# Patient Record
Sex: Female | Born: 1945 | Race: White | Hispanic: No | Marital: Married | Smoking: Never smoker
Health system: Southern US, Community
[De-identification: ages and names within clinical notes are randomized; demographics above are authoritative.]

## PROBLEM LIST (undated history)

## (undated) DIAGNOSIS — I219 Acute myocardial infarction, unspecified: Secondary | ICD-10-CM

## (undated) DIAGNOSIS — I259 Chronic ischemic heart disease, unspecified: Secondary | ICD-10-CM

## (undated) DIAGNOSIS — R5383 Other fatigue: Secondary | ICD-10-CM

## (undated) DIAGNOSIS — I10 Essential (primary) hypertension: Secondary | ICD-10-CM

## (undated) DIAGNOSIS — D649 Anemia, unspecified: Secondary | ICD-10-CM

## (undated) DIAGNOSIS — E785 Hyperlipidemia, unspecified: Secondary | ICD-10-CM

## (undated) DIAGNOSIS — N189 Chronic kidney disease, unspecified: Secondary | ICD-10-CM

## (undated) DIAGNOSIS — I5032 Chronic diastolic (congestive) heart failure: Secondary | ICD-10-CM

## (undated) DIAGNOSIS — M159 Polyosteoarthritis, unspecified: Secondary | ICD-10-CM

## (undated) DIAGNOSIS — I2510 Atherosclerotic heart disease of native coronary artery without angina pectoris: Secondary | ICD-10-CM

## (undated) DIAGNOSIS — E119 Type 2 diabetes mellitus without complications: Secondary | ICD-10-CM

## (undated) DIAGNOSIS — I110 Hypertensive heart disease with heart failure: Secondary | ICD-10-CM

## (undated) DIAGNOSIS — K529 Noninfective gastroenteritis and colitis, unspecified: Secondary | ICD-10-CM

## (undated) DIAGNOSIS — G2581 Restless legs syndrome: Secondary | ICD-10-CM

## (undated) DIAGNOSIS — E559 Vitamin D deficiency, unspecified: Secondary | ICD-10-CM

## (undated) DIAGNOSIS — R001 Bradycardia, unspecified: Secondary | ICD-10-CM

## (undated) DIAGNOSIS — D631 Anemia in chronic kidney disease: Secondary | ICD-10-CM

## (undated) HISTORY — DX: Essential (primary) hypertension: I10

## (undated) HISTORY — PX: CATARACT EXTRACTION, BILATERAL: SHX1313

## (undated) HISTORY — PX: VARICOSE VEIN SURGERY: SHX832

## (undated) HISTORY — DX: Chronic diastolic (congestive) heart failure: I50.32

## (undated) HISTORY — DX: Hypertensive heart disease with heart failure: I11.0

## (undated) HISTORY — DX: Bradycardia, unspecified: R00.1

## (undated) HISTORY — DX: Restless legs syndrome: G25.81

## (undated) HISTORY — DX: Acute myocardial infarction, unspecified: I21.9

## (undated) HISTORY — DX: Polyosteoarthritis, unspecified: M15.9

## (undated) HISTORY — PX: CARDIAC SURGERY: SHX584

## (undated) HISTORY — DX: Chronic kidney disease, unspecified: N18.9

## (undated) HISTORY — DX: Other fatigue: R53.83

## (undated) HISTORY — DX: Anemia in chronic kidney disease: D63.1

## (undated) HISTORY — DX: Vitamin D deficiency, unspecified: E55.9

## (undated) HISTORY — PX: COLONOSCOPY: SHX174

## (undated) HISTORY — DX: Chronic ischemic heart disease, unspecified: I25.9

## (undated) HISTORY — DX: Anemia, unspecified: D64.9

## (undated) HISTORY — DX: Hyperlipidemia, unspecified: E78.5

## (undated) HISTORY — DX: Type 2 diabetes mellitus without complications: E11.9

## (undated) MED FILL — Iron Sucrose Inj 20 MG/ML (Fe Equiv): INTRAVENOUS | Qty: 10 | Status: AC

---

## 2003-11-05 HISTORY — PX: ACHILLES TENDON SURGERY: SHX542

## 2009-11-04 HISTORY — PX: CORONARY ARTERY BYPASS GRAFT: SHX141

## 2009-11-27 ENCOUNTER — Ambulatory Visit: Payer: Self-pay | Admitting: Thoracic Surgery (Cardiothoracic Vascular Surgery)

## 2011-04-15 ENCOUNTER — Other Ambulatory Visit: Payer: Self-pay | Admitting: Internal Medicine

## 2015-05-15 ENCOUNTER — Encounter: Payer: Self-pay | Admitting: Podiatry

## 2015-05-15 ENCOUNTER — Ambulatory Visit (INDEPENDENT_AMBULATORY_CARE_PROVIDER_SITE_OTHER): Payer: Medicare PPO | Admitting: Podiatry

## 2015-05-15 ENCOUNTER — Ambulatory Visit (INDEPENDENT_AMBULATORY_CARE_PROVIDER_SITE_OTHER): Payer: Medicare PPO

## 2015-05-15 VITALS — BP 134/68 | HR 77 | Resp 18

## 2015-05-15 DIAGNOSIS — M722 Plantar fascial fibromatosis: Secondary | ICD-10-CM

## 2015-05-15 DIAGNOSIS — E1149 Type 2 diabetes mellitus with other diabetic neurological complication: Secondary | ICD-10-CM

## 2015-05-15 DIAGNOSIS — R52 Pain, unspecified: Secondary | ICD-10-CM

## 2015-05-15 DIAGNOSIS — M19172 Post-traumatic osteoarthritis, left ankle and foot: Secondary | ICD-10-CM | POA: Diagnosis not present

## 2015-05-15 MED ORDER — MELOXICAM 15 MG PO TABS: 15.0000 mg | ORAL_TABLET | Freq: Every day | ORAL | Status: DC

## 2015-05-15 NOTE — Progress Notes (Addendum)
   Subjective:    Patient ID: Carmen Ford, female    DOB: 1945/12/03, 69 y.o.   MRN: [id number]  HPI  MY FEET ARE SORE AND TENDER AND HAVE BEEN GOING ON FOR ABOUT 6 MONTHS OR LONGER AND I WALK ON THE OUTSIDE OF MY LEFT FOOT AND BURNS AND THROBS AND I AM A DIABETIC This diabetic patient presents to the office for myriad of complaints.  She says she experiences burning pain through right arch when she walks.  She has pain along the outer border left foot and wears down her shoes outide left foot.  She has history of Achilles rupture in 2007.  She was treated with cast.  She then was deen buy Dr. Nolon Nations who did surgery to repair her achilles.  She says her left foot now hurts all over.  Also she relates her big toe left foot is in contracted position.    Review of Systems  Eyes: Positive for visual disturbance.  Musculoskeletal:       DIFFICULTY WALKING  All other systems reviewed and are negative.      Objective:   Physical Exam Objective: Review of past medical history, medications, social history and allergies were performed.  Vascular: Dorsalis pedis and posterior tibial pulses were palpable B/L, capillary refill was  WNL B/L, temperature gradient was WNL B/L   Skin:  No signs of symptoms of infection or ulcers on both feet  Nails: appear healthy with no signs of mycosis or infections  Sensory: Semmes Weinstein monifilament diminished.  Orthopedic: Orthopedic evaluation demonstrates all joints distal t ankle have full ROM without crepitus,  Except she has  No sinus tarsi pain. no motion STJ left foot.  Good muscle strength left achilles tendon. Fixed plantarflexion position left foot.         Assessment & Plan:  Ankle and STJ arthritis left foot.   Plantar fascitis right foot.  Plantar fascitis right foot.  Treated with strapping.  Rearfoot arthritis causing lateral column pain during gait.  Tx with Mobic 15 mg.  # 30. RTC 2 weeks.

## 2015-06-05 ENCOUNTER — Encounter: Payer: Self-pay | Admitting: Podiatry

## 2015-06-05 ENCOUNTER — Ambulatory Visit (INDEPENDENT_AMBULATORY_CARE_PROVIDER_SITE_OTHER): Payer: Medicare PPO | Admitting: Podiatry

## 2015-06-05 VITALS — BP 176/78 | HR 72 | Resp 18

## 2015-06-05 DIAGNOSIS — M19172 Post-traumatic osteoarthritis, left ankle and foot: Secondary | ICD-10-CM | POA: Diagnosis not present

## 2015-06-05 DIAGNOSIS — S93602D Unspecified sprain of left foot, subsequent encounter: Secondary | ICD-10-CM | POA: Diagnosis not present

## 2015-06-05 NOTE — Progress Notes (Addendum)
Subjective:     Patient ID: Carmen Ford, female   DOB: 06-30-1946, 69 y.o.   MRN: [id number]  HPIThis patient returns saying her right foot has improved and the strapping had helped.  She says she feel this weekend due to her left foot.  She says her left foot givers out after walking about 50 yds.  The foot becomes heavy and she has poor control of her foot. The left foot had surgery for achilles tendon repair and then two surgeries in total.  She has muscle power WNL  She presents for evaluation and treatment.   Review of Systems     Objective:   Physical Exam GENERAL APPEARANCE: Alert, conversant. Appropriately groomed. No acute distress.  VASCULAR: Pedal pulses palpable at 2/4 DP and PT bilateral.  Capillary refill time is immediate to all digits,  Proximal to distal cooling it warm to warm.  Digital hair growth is present bilateral  NEUROLOGIC: sensation is intact epicritically and protectively to 5.07 monofilament at 5/5 sites bilateral.  Light touch is intact bilateral, vibratory sensation intact bilateral, achilles tendon reflex is intact bilateral.  MUSCULOSKELETAL: acceptable muscle strength, tone and stability bilateral.  Intrinsic muscluature intact bilateral.  Rectus appearance of foot and digits noted bilateral.  Sinus tarsi pain left foot.  Ankle tendons within normal limits.  No swelling noted left ankle.  Arthritis dorsum rearfoot. Limited ROM STJ  left rearfoot.      DERMATOLOGIC: skin color, texture, and turgor are within normal limits.  No preulcerative lesions or ulcers  are seen, no interdigital maceration noted.  No open lesions present.  Digital nails are asymptomatic. No drainage noted.      Assessment:     Foot Sprain left ankle.     Plan:     ROV  Sinus tarsi injection.  Apply foot strapping.  Decided to reschedule her in one week to monitor  My treatment and be evaluated by pedorthist.

## 2015-06-12 ENCOUNTER — Other Ambulatory Visit: Payer: Medicare PPO

## 2015-07-17 ENCOUNTER — Ambulatory Visit (INDEPENDENT_AMBULATORY_CARE_PROVIDER_SITE_OTHER): Payer: Medicare PPO | Admitting: Podiatry

## 2015-07-17 ENCOUNTER — Encounter: Payer: Self-pay | Admitting: Podiatry

## 2015-07-17 VITALS — BP 164/65 | HR 72 | Resp 14

## 2015-07-17 DIAGNOSIS — S93602D Unspecified sprain of left foot, subsequent encounter: Secondary | ICD-10-CM | POA: Diagnosis not present

## 2015-07-17 DIAGNOSIS — M19172 Post-traumatic osteoarthritis, left ankle and foot: Secondary | ICD-10-CM

## 2015-07-17 DIAGNOSIS — M19072 Primary osteoarthritis, left ankle and foot: Secondary | ICD-10-CM

## 2015-07-17 DIAGNOSIS — M79672 Pain in left foot: Secondary | ICD-10-CM

## 2015-07-17 NOTE — Progress Notes (Signed)
Subjective:     Patient ID: Carmen Ford, female   DOB: 12/22/1945, 69 y.o.   MRN: [id number]  HPI This patient presents to office saying her left foot and ankle is freezing up at night.  She says she has pain on both the inside and outside of her left ankle. She presents today to pick up her brace which was ordered for her arthritic rearfoot and s/p achilles tendon surgery.    Review of Systems     Objective:   Physical Exam GENERAL APPEARANCE: Alert, conversant. Appropriately groomed. No acute distress.  VASCULAR: Pedal pulses palpable at  [facility] and PT bilateral.  Capillary refill time is immediate to all digits,  Normal temperature gradient.  Digital hair growth is present bilateral  NEUROLOGIC: sensation is normal to 5.07 monofilament at 5/5 sites bilateral.  Light touch is intact bilateral, Muscle strength normal.  MUSCULOSKELETAL: acceptable muscle strength, tone and stability bilateral.  Intrinsic muscluature intact bilateral.  Rectus appearance of foot and digits noted bilateral. Sinus tarsi pain left foot with pain coursing around peroneal and posterior tibial tendon left foot.  Limited ROM  STJ left foot. Arthritis on dorsum left foot.  DERMATOLOGIC: skin color, texture, and turgor are within normal limits.  No preulcerative lesions or ulcers  are seen, no interdigital maceration noted.  No open lesions present.  Digital nails are asymptomatic. No drainage noted.      Assessment:     Rearfoot Arthritis  With tendinitis left foot.     Plan:     Disautel patient she was guarding her left foot which is leading to her freezing up.  Told to wear brace and return in 6 weeks.  Marland Kitchengam

## 2015-08-28 ENCOUNTER — Ambulatory Visit (INDEPENDENT_AMBULATORY_CARE_PROVIDER_SITE_OTHER): Payer: Medicare PPO

## 2015-08-28 ENCOUNTER — Encounter: Payer: Self-pay | Admitting: Podiatry

## 2015-08-28 ENCOUNTER — Ambulatory Visit (INDEPENDENT_AMBULATORY_CARE_PROVIDER_SITE_OTHER): Payer: Medicare PPO | Admitting: Podiatry

## 2015-08-28 VITALS — BP 163/63 | HR 65 | Resp 14

## 2015-08-28 DIAGNOSIS — R52 Pain, unspecified: Secondary | ICD-10-CM

## 2015-08-28 DIAGNOSIS — M19172 Post-traumatic osteoarthritis, left ankle and foot: Secondary | ICD-10-CM | POA: Diagnosis not present

## 2015-08-28 NOTE — Progress Notes (Signed)
Subjective:     Patient ID: Carmen Ford, female   DOB: 1946-04-28, 69 y.o.   MRN: [id number]  HPIThis patient presents to the office with pain persisting in her left foot.  Patient has arthritis left foot following achilles tendon surgery.  She says her left foot and leg become dead weight after a days activity  She points to areas of pain in her big toe and behind her outside ankle bone.  She says she was treated by myself with injection therapy which provided minimal relief.  She says she only uses the brace when she walks distances per [place] .  She presents for evaluation and treatment.A review of gher history reveals an injury to tendon behind her ankle which was treated with cast and she developed a fixed contracture left big toe.  She also had surgery and tissue used to replace her achilles tendon.   Review of Systems     Objective:   Physical Exam Physical Exam GENERAL APPEARANCE: Alert, conversant. Appropriately groomed. No acute distress.  VASCULAR: Pedal pulses palpable at [facility] and PT bilateral. Capillary refill time is immediate to all digits, Normal temperature gradient. Digital hair growth is present bilateral  NEUROLOGIC: sensation is diminished to 5.07 monofilament at 5/5 sites bilateral. Light touch is intact bilateral, Muscle strength normal.  MUSCULOSKELETAL: acceptable muscle strength, tone and stability bilateral. Intrinsic muscluature intact bilateral. Rectus appearance of foot and digits noted bilateral. Sinus tarsi pain left foot with pain coursing around peroneal and posterior tibial tendon left foot. Limited ROM STJ left foot. Arthritis on dorsum left foot. Contracted hallux digit in plantarflexory position IPJ left foot.  DERMATOLOGIC: skin color, texture, and turgor are within normal limits. No preulcerative lesions or ulcers are seen, no interdigital maceration noted. No open lesions present. Digital nails are asymptomatic. No drainage noted     Assessment:      Arthritis secondary foot surgery left foot.     Plan:     ROV>  Xray taken left foot revealing no forefoot deformity in addition to arthritic changes rearfoot as well as cystic changes lateral malleolus left foot. The brace is helping to walk which she wears when she knows she is walking.  There are no signs of inflammation left foot only generalized pain left foot.     Told patient the pain is due to her arthritis following her surgery.  I mentioned surgery as a solution which she is against.  I mention Mobic which she has taken with no relief.  She is not interested in pain medicine.  Therefore the only solution was to try cam walker which she has two at home.  RTC prn.

## 2016-02-03 HISTORY — PX: CHOLECYSTECTOMY: SHX55

## 2016-02-26 DIAGNOSIS — Z951 Presence of aortocoronary bypass graft: Secondary | ICD-10-CM

## 2016-02-26 DIAGNOSIS — I110 Hypertensive heart disease with heart failure: Secondary | ICD-10-CM

## 2016-02-26 DIAGNOSIS — I509 Heart failure, unspecified: Secondary | ICD-10-CM

## 2016-02-26 DIAGNOSIS — I25119 Atherosclerotic heart disease of native coronary artery with unspecified angina pectoris: Secondary | ICD-10-CM | POA: Insufficient documentation

## 2016-02-26 DIAGNOSIS — I5032 Chronic diastolic (congestive) heart failure: Secondary | ICD-10-CM

## 2016-02-26 DIAGNOSIS — R001 Bradycardia, unspecified: Secondary | ICD-10-CM | POA: Insufficient documentation

## 2016-02-26 HISTORY — DX: Heart failure, unspecified: I50.9

## 2016-02-26 HISTORY — DX: Atherosclerotic heart disease of native coronary artery with unspecified angina pectoris: I25.119

## 2016-02-26 HISTORY — DX: Chronic diastolic (congestive) heart failure: I50.32

## 2016-02-26 HISTORY — DX: Bradycardia, unspecified: R00.1

## 2016-02-26 HISTORY — DX: Hypertensive heart disease with heart failure: I11.0

## 2016-02-26 HISTORY — DX: Presence of aortocoronary bypass graft: Z95.1

## 2016-08-30 ENCOUNTER — Telehealth: Payer: Self-pay | Admitting: Podiatry

## 2016-08-30 NOTE — Telephone Encounter (Signed)
Patient called today Friday 27 October at 10:52 am and left a message checking on the status of her medical records and when will they be faxed to orthopedic. Called pt and left a message on home answering machine to call me back to see if this is in regards to the ongoing records request or if it a new records request.

## 2016-09-02 NOTE — Telephone Encounter (Signed)
Patient called back and I told her I would get her request taken care of this morning.

## 2016-09-18 ENCOUNTER — Encounter: Payer: Self-pay | Admitting: Podiatry

## 2016-09-18 NOTE — Progress Notes (Signed)
Put CD of patient's x-rays to be mailed tomorrow Thursday 19 September 2016 per patient's request. [facility]

## 2016-10-15 DIAGNOSIS — D631 Anemia in chronic kidney disease: Secondary | ICD-10-CM

## 2016-10-15 DIAGNOSIS — N189 Chronic kidney disease, unspecified: Secondary | ICD-10-CM

## 2016-10-15 DIAGNOSIS — R5383 Other fatigue: Secondary | ICD-10-CM

## 2016-10-15 HISTORY — DX: Anemia in chronic kidney disease: D63.1

## 2016-10-15 HISTORY — DX: Other fatigue: R53.83

## 2016-10-15 HISTORY — DX: Chronic kidney disease, unspecified: N18.9

## 2017-01-13 DIAGNOSIS — Z0001 Encounter for general adult medical examination with abnormal findings: Secondary | ICD-10-CM

## 2017-01-13 DIAGNOSIS — Z01810 Encounter for preprocedural cardiovascular examination: Secondary | ICD-10-CM | POA: Insufficient documentation

## 2017-01-13 HISTORY — DX: Encounter for general adult medical examination with abnormal findings: Z00.01

## 2017-05-01 ENCOUNTER — Encounter: Payer: Self-pay | Admitting: Cardiology

## 2017-05-08 DIAGNOSIS — N189 Chronic kidney disease, unspecified: Secondary | ICD-10-CM | POA: Insufficient documentation

## 2017-05-08 DIAGNOSIS — N179 Acute kidney failure, unspecified: Secondary | ICD-10-CM

## 2017-05-08 DIAGNOSIS — K529 Noninfective gastroenteritis and colitis, unspecified: Secondary | ICD-10-CM

## 2017-05-08 DIAGNOSIS — N184 Chronic kidney disease, stage 4 (severe): Secondary | ICD-10-CM

## 2017-05-08 HISTORY — DX: Acute kidney failure, unspecified: N17.9

## 2017-05-08 HISTORY — DX: Acute kidney failure, unspecified: N18.9

## 2017-05-08 HISTORY — DX: Chronic kidney disease, stage 4 (severe): N18.4

## 2017-05-08 HISTORY — DX: Noninfective gastroenteritis and colitis, unspecified: K52.9

## 2017-05-09 HISTORY — DX: Hypomagnesemia: E83.42

## 2017-05-15 DIAGNOSIS — I10 Essential (primary) hypertension: Secondary | ICD-10-CM

## 2017-05-15 DIAGNOSIS — I259 Chronic ischemic heart disease, unspecified: Secondary | ICD-10-CM

## 2017-05-15 DIAGNOSIS — G2581 Restless legs syndrome: Secondary | ICD-10-CM

## 2017-05-15 DIAGNOSIS — N189 Chronic kidney disease, unspecified: Secondary | ICD-10-CM

## 2017-05-15 DIAGNOSIS — M159 Polyosteoarthritis, unspecified: Secondary | ICD-10-CM

## 2017-05-15 DIAGNOSIS — E785 Hyperlipidemia, unspecified: Secondary | ICD-10-CM

## 2017-05-15 DIAGNOSIS — E559 Vitamin D deficiency, unspecified: Secondary | ICD-10-CM | POA: Insufficient documentation

## 2017-05-15 DIAGNOSIS — E1169 Type 2 diabetes mellitus with other specified complication: Secondary | ICD-10-CM | POA: Insufficient documentation

## 2017-05-15 HISTORY — DX: Essential (primary) hypertension: I10

## 2017-05-15 HISTORY — DX: Chronic kidney disease, unspecified: N18.9

## 2017-05-15 HISTORY — DX: Polyosteoarthritis, unspecified: M15.9

## 2017-05-15 HISTORY — DX: Restless legs syndrome: G25.81

## 2017-05-15 HISTORY — DX: Chronic ischemic heart disease, unspecified: I25.9

## 2017-05-15 HISTORY — DX: Vitamin D deficiency, unspecified: E55.9

## 2017-05-15 HISTORY — DX: Hyperlipidemia, unspecified: E78.5

## 2017-05-20 ENCOUNTER — Encounter: Payer: Self-pay | Admitting: Cardiology

## 2017-05-20 ENCOUNTER — Ambulatory Visit (INDEPENDENT_AMBULATORY_CARE_PROVIDER_SITE_OTHER): Payer: Medicare Other | Admitting: Cardiology

## 2017-05-20 VITALS — BP 126/64 | HR 65 | Ht 66.0 in | Wt 245.1 lb

## 2017-05-20 DIAGNOSIS — N184 Chronic kidney disease, stage 4 (severe): Secondary | ICD-10-CM | POA: Diagnosis not present

## 2017-05-20 DIAGNOSIS — I110 Hypertensive heart disease with heart failure: Secondary | ICD-10-CM | POA: Diagnosis not present

## 2017-05-20 DIAGNOSIS — I25119 Atherosclerotic heart disease of native coronary artery with unspecified angina pectoris: Secondary | ICD-10-CM

## 2017-05-20 DIAGNOSIS — D631 Anemia in chronic kidney disease: Secondary | ICD-10-CM | POA: Diagnosis not present

## 2017-05-20 DIAGNOSIS — I5032 Chronic diastolic (congestive) heart failure: Secondary | ICD-10-CM

## 2017-05-20 DIAGNOSIS — Z01810 Encounter for preprocedural cardiovascular examination: Secondary | ICD-10-CM | POA: Diagnosis not present

## 2017-05-20 NOTE — Patient Instructions (Addendum)
Medication Instructions:  Your physician recommends that you continue on your current medications as directed. Please refer to the Current Medication list given to you today.   Labwork: None  Testing/Procedures: You had an EKG today.  Follow-Up: Your physician wants you to follow-up in: 6 months. You will receive a reminder letter in the mail two months in advance. If you don't receive a letter, please call our office to schedule the follow-up appointment.   Any Other Special Instructions Will Be Listed Below (If Applicable).     If you need a refill on your cardiac medications before your next appointment, please call your pharmacy.    KNOW YOUR HEART FAILURE ZONES  Green Zone (Your Goal):  [name] No shortness of breath or trouble bleeding . No weight gain of more than 3 pounds in one day or 5 pounds in a week . No swelling in your ankles, feet, stomach, or hands . No chest discomfort, heaviness or pain  Yellow Zone (Call the Doctor to get help!): Having 1 or more of the following: . Weight gain of 3 pounds in 1 day or 5 pounds in 1 week . More swelling of your feet, ankles, stomach, or hands . It is harder to breath when lying down. You need to sit up . Chest discomfort, heaviness, or pain . You feel more tired or have less energy than normal . New or worsening dizziness . Dry hacking cough . You feel uneasy and you know something is not right  Red Zone (Call 911-EMERGENCY): . Struggling to breathe. This does not go away when you sit up. . Stronger and more regular amounts of chest discomfort . New confusion or can't think clearly . Fainting or near fainting   Resources form the American heart Association: RetroDivas.ch.jsp

## 2017-05-20 NOTE — Progress Notes (Signed)
Cardiology Office Note:    Date:  05/20/2017   ID:  Carmen Ford, DOB [birth year], MRN [id number]  PCP:  Jolayne Panther, PA-C  Cardiologist:  Shirlee More, MD    Referring MD: Jolayne Panther, PA-C    ASSESSMENT:    1. Preoperative cardiovascular examination   2. Coronary artery disease involving native coronary artery of native heart with angina pectoris ([place])   3. Chronic diastolic heart failure ([place])   4. Hypertensive heart disease with heart failure (HCC)   5. Stage 4 chronic kidney disease ([place])   6. Anemia in stage 4 chronic kidney disease ([place])    PLAN:    In order of problems listed above:  1. Her coronary disease and heart failure compensated and optimized. She has a normal ejection fraction and she's been revascularized with bypass surgery clinically has done well without evidence of recurrent clinical ischemia. The planned procedure is intermediate risk and I don't think she requires any further preoperative cardiovascular evaluation. I would recommend to remain on telemetry through the hospitalization as in the past she has had bradycardia with hyperkalemia and with beta blockers and appears to recently have had subclinical brief asymptomatic atrial fibrillation. I would continue her usual cardiac medications in the perioperative period including her diuretic statin and calcium channel blocker and avoid hyperkalemia and beta blockers. 2. Stable continue current medical treatment I do not think she requires a preoperative ischemia evaluation 3. Stable compensated continue current diuretic 4. Stable continue current antihypertensive medications 5. Stable, she has significant anemia and CK D and is at risk for the need for perioperative transfusion with a goal hemoglobin greater than 9.   Next appointment: 6 months   Medication Adjustments/Labs and Tests Ordered: Current medicines are reviewed at length with the patient today.  Concerns regarding medicines are outlined  above.  No orders of the defined types were placed in this encounter.  No orders of the defined types were placed in this encounter.   Chief Complaint  Patient presents with  . Follow-up    preop clearance prior to left knee surgery per Dr Rod Can at [facility]    History of Present Illness:    Carmen Ford is a 71 y.o. female with a hx of CAD, diastolic CHF, Dyslipidemia, HTN, S/P CABG in 2011, T2DM and CKD  last seen 3 months ago.. Admitted 05/10/17 to [facility] with colitis and AKI.She is pending left TKA. She was unaware that an EKG performed showed paroxysmal atrial fibrillation. There is no mention in the records other than the EKG report she was not seen by cardiology it wasn't treated or discussed with her. I did ask her to sign a release so I can look at that EKG but of atrial fibrillation was present and was subclinical and now recurrent. Cardiac perspective she is done well her heart failure is compensated she's not having edema shortness of breath chest pain palpitation or syncope. From a cardiac perspective I think she is optimized for the planned orthopedic surgery. Her EKG in my office today is normal  Compliance with diet, lifestyle and medications: Yes Past Medical History:  Diagnosis Date  . Acute colitis 05/08/2017   Overview:  CT of the abdomen shows right colitis.  Will administer IV Flagyl and Cipro, clear liquid diet, check stool for c diff and culture.  Will recheck lactic acid for sepsis.  Was supposed to have colonoscopy recently but has not had one for years.    Last Assessment &  Plan:  CT of the abdomen obtained showed right colitis.  Tolerated clear liquid diet.  States has had no further diarrhea since admission.  Patient was to have a colonoscopy recently but states has not had 1 for years.  Denies abdominal pain. Lactic acid 1.0 Plan: 5 day course oral Flagyl on discharge Diet regular cardiac/diabetic diet Stool for C diff and culture not  obtained as no further diarrhea Follow up with gastroenterology service in 4 to 6 weeks for a colonoscopy Follow up with PCP in 3 to 5 days  . Acute on chronic kidney failure ([place]) 05/08/2017   Last Assessment & Plan:  Patient presented to ER with diarrhea, renal panel indicated dehydration.  Reviewed past renal panels and creatinine is elevated from baseline of about 1.5 and now 2.3. Diarrhea has resolved per patient report and she is tolerating fluids, will advance to regular diabetic, cardiac diet. Plan: Resume home lasix lasix  Bun and creatine trending down Diarrhea resolved Tolerating by mouth  . Anemia   . Anemia in chronic kidney disease 10/15/2016  . Bradycardia 02/26/2016   Overview:  Not on a beta blocker with sinus bradycardia and Mobitz 1 second degree AV block  . Chronic diastolic heart failure ([place]) 02/26/2016   Overview:  Overview:  EF normal by echo 2013  . Chronic ischemic heart disease 05/15/2017  . CKD (chronic kidney disease) 05/15/2017  . Coronary artery disease involving native coronary artery of native heart with angina pectoris ([place]) 02/26/2016  . Diabetes mellitus without complication ([place])   . Essential (primary) hypertension 05/15/2017  . Fatigue 10/15/2016  . 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 Hyperlipidemia 05/15/2017  . Hypertensive heart disease with heart failure ([place]) 02/26/2016  . Hypomagnesemia 05/09/2017   Last Assessment & Plan:  Magnesium level 1.7 likely secondary to diarrhea loss Plan: Replaced   . Myocardial infarction ([place])   . 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 Restless legs syndrome 05/15/2017  . Vitamin D deficiency 05/15/2017  . Wellness examination 01/13/2017    Past Surgical History:  Procedure Laterality Date  . CARDIAC SURGERY    . CORONARY ARTERY BYPASS GRAFT      Current Medications: Current Meds  Medication Sig  . amLODipine (NORVASC) 10 MG tablet Take 10 mg by mouth daily.   [name] aspirin 81 MG  tablet Take 81 mg by mouth daily.  . BD PEN NEEDLE NANO U/F 32G X 4 MM MISC   . Cholecalciferol (EQL VITAMIN D3 PO) Take 2,000 mg by mouth daily.  . furosemide (LASIX) 20 MG tablet Take 20 mg by mouth 2 (two) times daily. On Monday, Wed, and Friday  . furosemide (LASIX) 20 MG tablet Take 1 tablet by mouth daily. Takes one pill on Tues, Thurs, Sat and Sun  . gabapentin (NEURONTIN) 400 MG capsule Take 400 mg by mouth 3 (three) times daily.   . insulin lispro (HUMALOG) 100 UNIT/ML injection Inject into the skin.  [name] losartan (COZAAR) 25 MG tablet Take 1 tablet by mouth daily.  [name] omega-3 acid ethyl esters (LOVAZA) 1 G capsule Take 2 g by mouth 2 (two) times daily.   . pravastatin (PRAVACHOL) 40 MG tablet Take 40 mg by mouth daily.   [name] rOPINIRole (REQUIP) 2 MG tablet Take 2 mg by mouth at bedtime.      Allergies:   Penicillins and Statins   Social History   Social History  .  Marital status: Married    Spouse name: N/A  . Number of children: N/A  . Years of education: N/A   Social History Main Topics  . Smoking status: Never Smoker  . Smokeless tobacco: Never Used  . Alcohol use No  . Drug use: No  . Sexual activity: Not Asked   Other Topics Concern  . None   Social History Narrative  . None     Family History: The patient's family history includes Congestive Heart Failure in her father; Diabetes in her mother; Hypertension in her mother. ROS:   Please see the history of present illness.    All other systems reviewed and are negative.  EKGs/Labs/Other Studies Reviewed:    The following studies were reviewed today: EKG 31-May-2017:  Atrial fibrillation Prolonged QT Abnormal ECG When compared with ECG of 08-May-2017 15:07, Atrial fibrillation has replaced Sinus rhythm Nonspecific T wave abnormality has replaced inverted T waves in Lateral leads Confirmed by LAWAL, OLUJIDE ([id number]) on 05/14/2017 5:32:32 PM EKG:  EKG ordered today.  The ekg ordered today demonstrates [place] poor R  wave progression Echo done may 2017 EF 55-60%   Reviewed the hospital records the physician who admitted her described the EKG is normal clinically I cannot reconcile this discrepancy without being able to actually see that EKG.   Recent Labs: 05/10/2017 hemoglobin 9.0 creatinine 1.9 GFR 26 mL/m potassium 4.1 BNP level low to 87.   Physical Exam:    VS:  There were no vitals taken for this visit.    Wt Readings from Last 3 Encounters:  No data found for Wt     GEN:  Well nourished, well developed in no acute distress HEENT: Normal NECK: No JVD; No carotid bruits LYMPHATICS: No lymphadenopathy CARDIAC: RRR, no murmurs, rubs, gallops RESPIRATORY:  Clear to auscultation without rales, wheezing or rhonchi  ABDOMEN: Soft, non-tender, non-distended MUSCULOSKELETAL:  No edema; No deformity  SKIN: Warm and dry NEUROLOGIC:  Alert and oriented x 3 PSYCHIATRIC:  Normal affect    Signed, Shirlee More, MD  05/20/2017 2:57 PM    [facility]

## 2017-05-23 ENCOUNTER — Ambulatory Visit: Payer: Self-pay | Admitting: Orthopedic Surgery

## 2017-05-27 NOTE — Pre-Procedure Instructions (Signed)
Carmen Ford  05/27/2017      [facility] - [place], [place] - [place] AT [address] [place] [address] Phone: [phone] Fax: [phone]  [place] [address] [place] - [place] [address] Phone: [phone] Fax: [phone]    Your procedure is scheduled on August 6. 2018.  Report to [facility] Admitting at 945 AM.  Call this number if you have problems the morning of surgery:  [phone]   Remember:  Do not eat food or drink liquids after midnight.  Take these medicines the morning of surgery with A SIP OF WATER amlodipine (norvasc), gabapentin (neurontin).  7 days prior to surgery STOP taking any Aspirin, Aleve, Naproxen, Ibuprofen, Motrin, Advil, Goody's, BC's, all herbal medications, fish oil, and all vitamins     How to Manage Your Diabetes Before and After Surgery  Why is it important to control my blood sugar before and after surgery? . Improving blood sugar levels before and after surgery helps healing and can limit problems. . A way of improving blood sugar control is eating a healthy diet by: o  Eating less sugar and carbohydrates o  Increasing activity/exercise o  Talking with your doctor about reaching your blood sugar goals . High blood sugars (greater than 180 mg/dL) can raise your risk of infections and slow your recovery, so you will need to focus on controlling your diabetes during the weeks before surgery. . Make sure that the doctor who takes care of your diabetes knows about your planned surgery including the date and location.  How do I manage my blood sugar before surgery? . Check your blood sugar at least 4 times a day, starting 2 days before surgery, to make sure that the level is not too high or low. o Check your blood sugar the morning of your surgery when you wake up and every 2 hours until you get  to the Short Stay unit. . If your blood sugar is less than 70 mg/dL, you will need to treat for low blood sugar: o Do not take insulin. o Treat a low blood sugar (less than 70 mg/dL) with  cup of clear juice (cranberry or apple), 4 glucose tablets, OR glucose gel. o Recheck blood sugar in 15 minutes after treatment (to make sure it is greater than 70 mg/dL). If your blood sugar is not greater than 70 mg/dL on recheck, call [phone] for further instructions. . Report your blood sugar to the short stay nurse when you get to Short Stay.  . If you are admitted to the hospital after surgery: o Your blood sugar will be checked by the staff and you will probably be given insulin after surgery (instead of oral diabetes medicines) to make sure you have good blood sugar levels. o The goal for blood sugar control after surgery is 80-180 mg/dL.      WHAT DO I DO ABOUT MY DIABETES MEDICATION?   [name] Do not take oral diabetes medicines (pills) the morning of surgery.  . THE NIGHT BEFORE SURGERY, take your normal dose of humalog insulin with dinner and half the dose of lantus insulin (either 7 or 15 units depending on your CBG reading).      . THE MORNING OF SURGERY, take no insulin unless your CBG is greater than 220 mg/dL, you may take  of your sliding scale (correction) dose of insulin.  [name]  The day of surgery, do not take other diabetes injectables, including Byetta (exenatide), Bydureon (exenatide ER), Victoza (liraglutide), or Trulicity (dulaglutide).  . If your CBG is greater than 220 mg/dL, you may take  of your sliding scale (correction) dose of insulin.  Reviewed and Endorsed by [facility] Patient Education Committee, August 2015  Do not wear jewelry, make-up or nail polish.  Do not wear lotions, powders, or perfumes, or deoderant.  Do not shave 48 hours prior to surgery.  Men may shave face and neck.  Do not bring valuables to the hospital.  [facility] is not responsible for any  belongings or valuables.  Contacts, dentures or bridgework may not be worn into surgery.  Leave your suitcase in the car.  After surgery it may be brought to your room.  For patients admitted to the hospital, discharge time will be determined by your treatment team.  Patients discharged the day of surgery will not be allowed to drive home.   Special instructions:   [facility]- Preparing For Surgery  Before surgery, you can play an important role. Because skin is not sterile, your skin needs to be as free of germs as possible. You can reduce the number of germs on your skin by washing with CHG (chlorahexidine gluconate) Soap before surgery.  CHG is an antiseptic cleaner which kills germs and bonds with the skin to continue killing germs even after washing.  Please do not use if you have an allergy to CHG or antibacterial soaps. If your skin becomes reddened/irritated stop using the CHG.  Do not shave (including legs and underarms) for at least 48 hours prior to first CHG shower. It is OK to shave your face.  Please follow these instructions carefully.   1. Shower the NIGHT BEFORE SURGERY and the MORNING OF SURGERY with CHG.   2. If you chose to wash your hair, wash your hair first as usual with your normal shampoo.  3. After you shampoo, rinse your hair and body thoroughly to remove the shampoo.  4. Use CHG as you would any other liquid soap. You can apply CHG directly to the skin and wash gently with a scrungie or a clean washcloth.   5. Apply the CHG Soap to your body ONLY FROM THE NECK DOWN.  Do not use on open wounds or open sores. Avoid contact with your eyes, ears, mouth and genitals (private parts). Wash genitals (private parts) with your normal soap.  6. Wash thoroughly, paying special attention to the area where your surgery will be performed.  7. Thoroughly rinse your body with warm water from the neck down.  8. DO NOT shower/wash with your normal soap after using and  rinsing off the CHG Soap.  9. Pat yourself dry with a CLEAN TOWEL.   10. Wear CLEAN PAJAMAS   11. Place CLEAN SHEETS on your bed the night of your first shower and DO NOT SLEEP WITH PETS.    Day of Surgery: Do not apply any deodorants/lotions. Please wear clean clothes to the hospital/surgery center.     Please read over the following fact sheets that you were given. Pain Booklet, Coughing and Deep Breathing, MRSA Information and Surgical Site Infection Prevention

## 2017-05-28 ENCOUNTER — Other Ambulatory Visit (HOSPITAL_COMMUNITY): Payer: Medicare Other

## 2017-05-29 ENCOUNTER — Encounter (HOSPITAL_COMMUNITY): Payer: Self-pay

## 2017-05-29 ENCOUNTER — Telehealth: Payer: Self-pay | Admitting: Cardiology

## 2017-05-29 ENCOUNTER — Ambulatory Visit: Payer: Self-pay | Admitting: Orthopedic Surgery

## 2017-05-29 ENCOUNTER — Encounter (HOSPITAL_COMMUNITY)
Admission: RE | Admit: 2017-05-29 | Discharge: 2017-05-29 | Disposition: A | Discharge status: Home / Self Care | Payer: Medicare Other | Source: Ambulatory Visit | Attending: Orthopedic Surgery | Admitting: Orthopedic Surgery

## 2017-05-29 DIAGNOSIS — M1712 Unilateral primary osteoarthritis, left knee: Secondary | ICD-10-CM | POA: Insufficient documentation

## 2017-05-29 DIAGNOSIS — Z0183 Encounter for blood typing: Secondary | ICD-10-CM | POA: Diagnosis not present

## 2017-05-29 DIAGNOSIS — Z01812 Encounter for preprocedural laboratory examination: Secondary | ICD-10-CM | POA: Diagnosis present

## 2017-05-29 HISTORY — DX: Noninfective gastroenteritis and colitis, unspecified: K52.9

## 2017-05-29 HISTORY — DX: Atherosclerotic heart disease of native coronary artery without angina pectoris: I25.10

## 2017-05-29 LAB — BASIC METABOLIC PANEL
Anion gap: 11 (ref 5–15)
BUN: 34 mg/dL — AB (ref 6–20)
CALCIUM: 8.7 mg/dL — AB (ref 8.9–10.3)
CO2: 23 mmol/L (ref 22–32)
CREATININE: 1.69 mg/dL — AB (ref 0.44–1.00)
Chloride: 104 mmol/L (ref 101–111)
GFR calc Af Amer: 34 mL/min — ABNORMAL LOW (ref >60)
GFR, EST NON AFRICAN AMERICAN: 30 mL/min — AB (ref >60)
GLUCOSE: 195 mg/dL — AB (ref 65–99)
POTASSIUM: 4.7 mmol/L (ref 3.5–5.1)
SODIUM: 138 mmol/L (ref 135–145)

## 2017-05-29 LAB — CBC
HEMATOCRIT: 31.5 % — AB (ref 36.0–46.0)
Hemoglobin: 10.3 g/dL — ABNORMAL LOW (ref 12.0–15.0)
MCH: 27.9 pg (ref 26.0–34.0)
MCHC: 32.7 g/dL (ref 30.0–36.0)
MCV: 85.4 fL (ref 78.0–100.0)
PLATELETS: 269 10*3/uL (ref 150–400)
RBC: 3.69 MIL/uL — ABNORMAL LOW (ref 3.87–5.11)
RDW: 15 % (ref 11.5–15.5)
WBC: 8.2 10*3/uL (ref 4.0–10.5)

## 2017-05-29 LAB — ABO/RH: ABO/RH(D): A NEG

## 2017-05-29 LAB — GLUCOSE, CAPILLARY: Glucose-Capillary: 158 mg/dL — ABNORMAL HIGH (ref 65–99)

## 2017-05-29 LAB — SURGICAL PCR SCREEN
MRSA, PCR: NEGATIVE
STAPHYLOCOCCUS AUREUS: NEGATIVE

## 2017-05-29 NOTE — H&P (Signed)
TOTAL KNEE ADMISSION H&P  Patient is being admitted for left total knee arthroplasty.  Subjective:  Chief Complaint:left knee pain.  HPI: Carmen Ford, 71 y.o. female, has a history of pain and functional disability in the left knee due to arthritis and has failed non-surgical conservative treatments for greater than 12 weeks to includeNSAID's and/or analgesics, corticosteriod injections, flexibility and strengthening excercises, use of assistive devices, weight reduction as appropriate and activity modification.  Onset of symptoms was gradual, starting >10 years ago with rapidlly worsening course since that time. The patient noted no past surgery on the left knee(s).  Patient currently rates pain in the left knee(s) at 10 out of 10 with activity. Patient has night pain, worsening of pain with activity and weight bearing, pain that interferes with activities of daily living, pain with passive range of motion, crepitus and joint swelling.  Patient has evidence of subchondral cysts, subchondral sclerosis, periarticular osteophytes, joint subluxation and joint space narrowing by imaging studies. There is no active infection.  Patient Active Problem List   Diagnosis Date Noted  . Chronic ischemic heart disease 05/15/2017  . CKD (chronic kidney disease) 05/15/2017  . Essential (primary) hypertension 05/15/2017  . Hyperlipidemia 05/15/2017  . 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 Restless legs syndrome 05/15/2017  . Vitamin D deficiency 05/15/2017  . Hypomagnesemia 05/09/2017  . Acute colitis 05/08/2017  . Acute on chronic kidney failure ([place]) 05/08/2017  . Preoperative cardiovascular examination 01/13/2017  . Anemia in chronic kidney disease 10/15/2016  . Fatigue 10/15/2016  . Bradycardia 02/26/2016  . Chronic diastolic heart failure ([place]) 02/26/2016  . Coronary artery disease involving native coronary artery of native heart with angina pectoris ([place]) 02/26/2016  . Hx of CABG 02/26/2016  .  Hypertensive heart disease with heart failure ([place]) 02/26/2016   Past Medical History:  Diagnosis Date  . Acute colitis 05/08/2017   Overview:  CT of the abdomen shows right colitis.  Will administer IV Flagyl and Cipro, clear liquid diet, check stool for c diff and culture.  Will recheck lactic acid for sepsis.  Was supposed to have colonoscopy recently but has not had one for years.    Last Assessment & Plan:  CT of the abdomen obtained showed right colitis.  Tolerated clear liquid diet.  States has had no further diarrhea since admission.  Patient was to have a colonoscopy recently but states has not had 1 for years.  Denies abdominal pain. Lactic acid 1.0 Plan: 5 day course oral Flagyl on discharge Diet regular cardiac/diabetic diet Stool for C diff and culture not obtained as no further diarrhea Follow up with gastroenterology service in 4 to 6 weeks for a colonoscopy Follow up with PCP in 3 to 5 days  . Acute on chronic kidney failure ([place]) 05/08/2017   Last Assessment & Plan:  Patient presented to ER with diarrhea, renal panel indicated dehydration.  Reviewed past renal panels and creatinine is elevated from baseline of about 1.5 and now 2.3. Diarrhea has resolved per patient report and she is tolerating fluids, will advance to regular diabetic, cardiac diet. Plan: Resume home lasix lasix  Bun and creatine trending down Diarrhea resolved Tolerating by mouth  . Anemia   . Anemia in chronic kidney disease 10/15/2016  . Bradycardia 02/26/2016   Overview:  Not on a beta blocker with sinus bradycardia and Mobitz 1 second degree AV block  . Chronic diastolic heart failure ([place]) 02/26/2016   Overview:  Overview:  EF normal by echo 2013  . Chronic  ischemic heart disease 05/15/2017  . CKD (chronic kidney disease) 05/15/2017  . Coronary artery disease involving native coronary artery of native heart with angina pectoris ([place]) 02/26/2016  . Diabetes mellitus without complication ([place])   . Essential (primary)  hypertension 05/15/2017  . Fatigue 10/15/2016  . 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 Hyperlipidemia 05/15/2017  . Hypertensive heart disease with heart failure ([place]) 02/26/2016  . Hypomagnesemia 05/09/2017   Last Assessment & Plan:  Magnesium level 1.7 likely secondary to diarrhea loss Plan: Replaced   . Myocardial infarction ([place])   . 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 Restless legs syndrome 05/15/2017  . Vitamin D deficiency 05/15/2017  . Wellness examination 01/13/2017    Past Surgical History:  Procedure Laterality Date  . CARDIAC SURGERY    . CORONARY ARTERY BYPASS GRAFT       (Not in a hospital admission) Allergies  Allergen Reactions  . Penicillins     tiredess  . Statins Other (See Comments)    myalgias    Social History  Substance Use Topics  . Smoking status: Never Smoker  . Smokeless tobacco: Never Used  . Alcohol use No    Family History  Problem Relation Age of Onset  . Diabetes Mother   . Hypertension Mother   . Congestive Heart Failure Father      Review of Systems  Constitutional: Negative.   HENT: Negative.   Eyes: Negative.   Respiratory: Positive for shortness of breath.   Cardiovascular: Negative.   Gastrointestinal: Negative.   Genitourinary: Negative.   Musculoskeletal: Positive for joint pain.  Skin: Positive for rash.  Neurological: Negative.   Endo/Heme/Allergies: Negative.   Psychiatric/Behavioral: Negative.     Objective:  Physical Exam  Vitals reviewed. Constitutional: She is oriented to person, place, and time. She appears well-developed and well-nourished.  HENT:  Head: Normocephalic and atraumatic.  Eyes: Pupils are equal, round, and reactive to light. Conjunctivae and EOM are normal.  Neck: Normal range of motion. Neck supple.  Cardiovascular: Normal rate, regular rhythm and intact distal pulses.   Respiratory: Effort normal. No respiratory distress.  GI: Soft. She exhibits no  distension.  Genitourinary:  Genitourinary Comments: deferred  Musculoskeletal:       Left knee: She exhibits abnormal alignment and bony tenderness. Tenderness found. Medial joint line and lateral joint line tenderness noted.       Legs: Neurological: She is alert and oriented to person, place, and time. She has normal reflexes.  Skin: Skin is warm and dry.  Psychiatric: She has a normal mood and affect. Her behavior is normal. Judgment and thought content normal.    Vital signs in last 24 hours: @VSRANGES @  Labs:   Estimated body mass index is 39.56 kg/m as calculated from the following:   Height as of 05/20/17: 5\' 6"  (1.676 m).   Weight as of 05/20/17: 111.2 kg (245 lb 1.9 oz).   Imaging Review Plain radiographs demonstrate severe degenerative joint disease of the left knee(s). The overall alignment issignificant varus. The bone quality appears to be poor for age and reported activity level.  Assessment/Plan:  End stage arthritis, left knee   The patient history, physical examination, clinical judgment of the provider and imaging studies are consistent with end stage degenerative joint disease of the left knee(s) and total knee arthroplasty is deemed medically necessary. The treatment options including medical management, injection therapy arthroscopy and arthroplasty were  discussed at length. The risks and benefits of total knee arthroplasty were presented and reviewed. The risks due to aseptic loosening, infection, stiffness, patella tracking problems, thromboembolic complications and other imponderables were discussed. The patient acknowledged the explanation, agreed to proceed with the plan and consent was signed. Patient is being admitted for inpatient treatment for surgery, pain control, PT, OT, prophylactic antibiotics, VTE prophylaxis, progressive ambulation and ADL's and discharge planning. The patient is planning to be discharged home with home health services

## 2017-05-29 NOTE — Telephone Encounter (Signed)
Letter reprinted from EKG machine. Scanned into patient's chart. Left message for [facility] with pre-op that EKG is now scanned into chart. Also, called daughter, Charna Busman, to make her aware that EKG was now in patient's chart.

## 2017-05-29 NOTE — Progress Notes (Addendum)
PCP: Leonie Green, PA @ [facility] in [place], [place] Cardiologist: Dr. Shirlee More Nephrologist:Dr. Verdell Carmine Hardenberg in [place], [place]   Endocrinologist: Dr. Baker Janus Lowery--fax over last hgb A1C  Fasting sugars 50-70 (pt. Reports MD is adjusting her medications and has an appointment 06/02/17. Pt. Also has a [employer]  That is change every 10 days. She will remove it prior to surgery.  Makayla from Dr. Joya Gaskins office stated they have to send out ekg's to be scanned. When she gets this pt's ekg she will fax it to [place].

## 2017-05-29 NOTE — Telephone Encounter (Signed)
Is at pre op appt and they are stating her EKG is not showing in system and they need a copy of it

## 2017-06-02 ENCOUNTER — Encounter (HOSPITAL_COMMUNITY): Payer: Self-pay

## 2017-06-02 NOTE — Progress Notes (Addendum)
Anesthesia Chart Review:  Pt is a 71 year old female scheduled for computer assisted L total knee arthroplasty on 06/09/2017 with Rod Can, MD.  - PCP is Dawayne Patricia, PA who cleared pt for surgery  - Cardiologist is Shirlee More, MD who cleared pt for surgery at last office visit 05/20/17 noting "I would recommend to remain on telemetry through the hospitalization as in the past she has had bradycardia with hyperkalemia and with beta blockers and appears to recently have had subclinical brief asymptomatic atrial fibrillation. 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 Never smoker. BMI 39  Medications include: Amlodipine, ASA 81 mg, Lasix, Lantus, Humalog, losartan, pravastatin  Preoperative labs reviewed.   - Cr 1.69, BUN 34. This is consistent with prior results (care everywhere) - glucose 195. HbA1c 5.9 on 05/09/17 (care everywhere)  EKG 05/20/17: Sinus rhythm with first-degree AV block  Echo 03/11/16 [facility] cardiology [place]): 1. Concentric LVH. Visual EF 55-60%. Doppler evidence of grade II diastolic dysfunction.  2. LA cavity moderately dilated. 3. Structurally normal mitral valve with trace regurgitation. 4. Structurally normal tricuspid valve with mild regurgitation  If no changes, I anticipate pt can proceed with surgery as scheduled.   Willeen Cass, FNP-BC [facility] Short Stay [facility]/Anesthesiology Phone: [phone] 06/03/2017 10:03 AM

## 2017-06-06 MED ORDER — TRANEXAMIC ACID 1000 MG/10ML IV SOLN
1000.0000 mg | INTRAVENOUS | Status: AC
  Administered 2017-06-09: 1000 mg via INTRAVENOUS
  Filled 2017-06-06: qty 10

## 2017-06-09 ENCOUNTER — Inpatient Hospital Stay (HOSPITAL_COMMUNITY): Payer: Medicare Other | Admitting: Certified Registered"

## 2017-06-09 ENCOUNTER — Inpatient Hospital Stay (HOSPITAL_COMMUNITY): Payer: Medicare Other | Admitting: Emergency Medicine

## 2017-06-09 ENCOUNTER — Encounter (HOSPITAL_COMMUNITY)
Admission: RE | Disposition: A | Discharge status: Home / Self Care | Payer: Self-pay | Source: Ambulatory Visit | Attending: Orthopedic Surgery

## 2017-06-09 ENCOUNTER — Inpatient Hospital Stay (HOSPITAL_COMMUNITY)
Admission: RE | Admit: 2017-06-09 | Discharge: 2017-06-11 | DRG: 470 | Disposition: A | Discharge status: Home / Self Care | Payer: Medicare Other | Source: Ambulatory Visit | Attending: Orthopedic Surgery | Admitting: Orthopedic Surgery

## 2017-06-09 ENCOUNTER — Inpatient Hospital Stay (HOSPITAL_COMMUNITY): Payer: Medicare Other

## 2017-06-09 ENCOUNTER — Encounter (HOSPITAL_COMMUNITY): Payer: Self-pay | Admitting: Certified Registered"

## 2017-06-09 DIAGNOSIS — E1122 Type 2 diabetes mellitus with diabetic chronic kidney disease: Secondary | ICD-10-CM | POA: Diagnosis present

## 2017-06-09 DIAGNOSIS — N189 Chronic kidney disease, unspecified: Secondary | ICD-10-CM | POA: Diagnosis present

## 2017-06-09 DIAGNOSIS — G2581 Restless legs syndrome: Secondary | ICD-10-CM | POA: Diagnosis present

## 2017-06-09 DIAGNOSIS — D62 Acute posthemorrhagic anemia: Secondary | ICD-10-CM | POA: Diagnosis not present

## 2017-06-09 DIAGNOSIS — I252 Old myocardial infarction: Secondary | ICD-10-CM | POA: Diagnosis not present

## 2017-06-09 DIAGNOSIS — Z880 Allergy status to penicillin: Secondary | ICD-10-CM

## 2017-06-09 DIAGNOSIS — Z09 Encounter for follow-up examination after completed treatment for conditions other than malignant neoplasm: Secondary | ICD-10-CM

## 2017-06-09 DIAGNOSIS — I5032 Chronic diastolic (congestive) heart failure: Secondary | ICD-10-CM | POA: Diagnosis present

## 2017-06-09 DIAGNOSIS — I130 Hypertensive heart and chronic kidney disease with heart failure and stage 1 through stage 4 chronic kidney disease, or unspecified chronic kidney disease: Secondary | ICD-10-CM | POA: Diagnosis present

## 2017-06-09 DIAGNOSIS — Z9889 Other specified postprocedural states: Secondary | ICD-10-CM | POA: Diagnosis not present

## 2017-06-09 DIAGNOSIS — Z888 Allergy status to other drugs, medicaments and biological substances status: Secondary | ICD-10-CM

## 2017-06-09 DIAGNOSIS — Z8249 Family history of ischemic heart disease and other diseases of the circulatory system: Secondary | ICD-10-CM

## 2017-06-09 DIAGNOSIS — E559 Vitamin D deficiency, unspecified: Secondary | ICD-10-CM | POA: Diagnosis present

## 2017-06-09 DIAGNOSIS — Z833 Family history of diabetes mellitus: Secondary | ICD-10-CM

## 2017-06-09 DIAGNOSIS — M1712 Unilateral primary osteoarthritis, left knee: Secondary | ICD-10-CM

## 2017-06-09 DIAGNOSIS — I2510 Atherosclerotic heart disease of native coronary artery without angina pectoris: Secondary | ICD-10-CM | POA: Diagnosis present

## 2017-06-09 DIAGNOSIS — Z951 Presence of aortocoronary bypass graft: Secondary | ICD-10-CM | POA: Diagnosis not present

## 2017-06-09 HISTORY — PX: KNEE ARTHROPLASTY: SHX992

## 2017-06-09 HISTORY — DX: Unilateral primary osteoarthritis, left knee: M17.12

## 2017-06-09 LAB — GLUCOSE, CAPILLARY
GLUCOSE-CAPILLARY: 137 mg/dL — AB (ref 65–99)
GLUCOSE-CAPILLARY: 122 mg/dL — AB (ref 65–99)
GLUCOSE-CAPILLARY: 132 mg/dL — AB (ref 65–99)
GLUCOSE-CAPILLARY: 135 mg/dL — AB (ref 65–99)
Glucose-Capillary: 125 mg/dL — ABNORMAL HIGH (ref 65–99)

## 2017-06-09 LAB — PREPARE RBC (CROSSMATCH)

## 2017-06-09 SURGERY — ARTHROPLASTY, KNEE, TOTAL, USING IMAGELESS COMPUTER-ASSISTED NAVIGATION
Anesthesia: Regional | Site: Knee | Laterality: Left

## 2017-06-09 MED ORDER — METHOCARBAMOL 1000 MG/10ML IJ SOLN
500.0000 mg | Freq: Four times a day (QID) | INTRAVENOUS | Status: DC | PRN
  Filled 2017-06-09: qty 5

## 2017-06-09 MED ORDER — AMLODIPINE BESYLATE 10 MG PO TABS
10.0000 mg | ORAL_TABLET | Freq: Every day | ORAL | Status: DC
  Administered 2017-06-10 – 2017-06-11 (×2): 10 mg via ORAL
  Filled 2017-06-09 (×2): qty 1

## 2017-06-09 MED ORDER — DIPHENHYDRAMINE HCL 12.5 MG/5ML PO ELIX: 12.5000 mg | ORAL_SOLUTION | ORAL | Status: DC | PRN

## 2017-06-09 MED ORDER — KETOROLAC TROMETHAMINE 30 MG/ML IJ SOLN
INTRAMUSCULAR | Status: AC
  Filled 2017-06-09: qty 1

## 2017-06-09 MED ORDER — ONDANSETRON HCL 4 MG/2ML IJ SOLN
INTRAMUSCULAR | Status: DC | PRN
  Administered 2017-06-09: 4 mg via INTRAVENOUS

## 2017-06-09 MED ORDER — MENTHOL 3 MG MT LOZG: 1.0000 | LOZENGE | OROMUCOSAL | Status: DC | PRN

## 2017-06-09 MED ORDER — ACETAMINOPHEN 10 MG/ML IV SOLN
1000.0000 mg | INTRAVENOUS | Status: AC
  Administered 2017-06-09: 1000 mg via INTRAVENOUS
  Filled 2017-06-09: qty 100

## 2017-06-09 MED ORDER — POLYETHYLENE GLYCOL 3350 17 G PO PACK: 17.0000 g | PACK | Freq: Every day | ORAL | Status: DC | PRN

## 2017-06-09 MED ORDER — ONDANSETRON HCL 4 MG PO TABS: 4.0000 mg | ORAL_TABLET | Freq: Four times a day (QID) | ORAL | Status: DC | PRN

## 2017-06-09 MED ORDER — SODIUM CHLORIDE 0.9 % IJ SOLN
INTRAMUSCULAR | Status: DC | PRN
  Administered 2017-06-09: 30 mL

## 2017-06-09 MED ORDER — GABAPENTIN 400 MG PO CAPS
400.0000 mg | ORAL_CAPSULE | Freq: Three times a day (TID) | ORAL | Status: DC
  Administered 2017-06-09 – 2017-06-11 (×6): 400 mg via ORAL
  Filled 2017-06-09 (×6): qty 1

## 2017-06-09 MED ORDER — METOCLOPRAMIDE HCL 5 MG/ML IJ SOLN: 5.0000 mg | Freq: Three times a day (TID) | INTRAMUSCULAR | Status: DC | PRN

## 2017-06-09 MED ORDER — POVIDONE-IODINE 10 % EX SWAB: 2.0000 "application " | Freq: Once | CUTANEOUS | Status: DC

## 2017-06-09 MED ORDER — PHENOL 1.4 % MT LIQD: 1.0000 | OROMUCOSAL | Status: DC | PRN

## 2017-06-09 MED ORDER — PROPOFOL 10 MG/ML IV BOLUS
INTRAVENOUS | Status: DC | PRN
  Administered 2017-06-09: 200 mg via INTRAVENOUS

## 2017-06-09 MED ORDER — CLINDAMYCIN PHOSPHATE 900 MG/50ML IV SOLN
900.0000 mg | INTRAVENOUS | Status: AC
  Administered 2017-06-09: 900 mg via INTRAVENOUS
  Filled 2017-06-09: qty 50

## 2017-06-09 MED ORDER — HYDROMORPHONE HCL 1 MG/ML IJ SOLN
INTRAMUSCULAR | Status: AC
  Administered 2017-06-09: 1 mg
  Filled 2017-06-09: qty 1

## 2017-06-09 MED ORDER — INSULIN GLARGINE 100 UNIT/ML ~~LOC~~ SOLN
15.0000 [IU] | Freq: Every day | SUBCUTANEOUS | Status: DC
  Filled 2017-06-09 (×3): qty 0.15

## 2017-06-09 MED ORDER — SUGAMMADEX SODIUM 500 MG/5ML IV SOLN
INTRAVENOUS | Status: AC
  Filled 2017-06-09: qty 5

## 2017-06-09 MED ORDER — METOCLOPRAMIDE HCL 5 MG PO TABS: 5.0000 mg | ORAL_TABLET | Freq: Three times a day (TID) | ORAL | Status: DC | PRN

## 2017-06-09 MED ORDER — ACETAMINOPHEN 325 MG PO TABS: 650.0000 mg | ORAL_TABLET | Freq: Four times a day (QID) | ORAL | Status: DC | PRN

## 2017-06-09 MED ORDER — ROPINIROLE HCL 1 MG PO TABS
2.0000 mg | ORAL_TABLET | Freq: Every day | ORAL | Status: DC
  Administered 2017-06-09 – 2017-06-10 (×2): 2 mg via ORAL
  Filled 2017-06-09 (×2): qty 2

## 2017-06-09 MED ORDER — INSULIN ASPART 100 UNIT/ML ~~LOC~~ SOLN
0.0000 [IU] | Freq: Three times a day (TID) | SUBCUTANEOUS | Status: DC
  Administered 2017-06-10: 2 [IU] via SUBCUTANEOUS
  Administered 2017-06-10: 3 [IU] via SUBCUTANEOUS

## 2017-06-09 MED ORDER — METHOCARBAMOL 500 MG PO TABS
500.0000 mg | ORAL_TABLET | Freq: Four times a day (QID) | ORAL | Status: DC | PRN
Start: 2017-06-09 — End: 2017-06-11
  Administered 2017-06-09 – 2017-06-11 (×4): 500 mg via ORAL
  Filled 2017-06-09 (×4): qty 1

## 2017-06-09 MED ORDER — KETOROLAC TROMETHAMINE 30 MG/ML IJ SOLN
INTRAMUSCULAR | Status: DC | PRN
  Administered 2017-06-09: 30 mg

## 2017-06-09 MED ORDER — DEXAMETHASONE SODIUM PHOSPHATE 10 MG/ML IJ SOLN
10.0000 mg | Freq: Once | INTRAMUSCULAR | Status: AC
  Administered 2017-06-10: 10 mg via INTRAVENOUS
  Filled 2017-06-09: qty 1

## 2017-06-09 MED ORDER — BUPIVACAINE-EPINEPHRINE (PF) 0.5% -1:200000 IJ SOLN
INTRAMUSCULAR | Status: AC
  Filled 2017-06-09: qty 30

## 2017-06-09 MED ORDER — CLINDAMYCIN PHOSPHATE 600 MG/50ML IV SOLN
600.0000 mg | Freq: Four times a day (QID) | INTRAVENOUS | Status: AC
  Administered 2017-06-09 – 2017-06-10 (×2): 600 mg via INTRAVENOUS
  Filled 2017-06-09 (×2): qty 50

## 2017-06-09 MED ORDER — APIXABAN 2.5 MG PO TABS
2.5000 mg | ORAL_TABLET | Freq: Two times a day (BID) | ORAL | Status: DC
  Administered 2017-06-10 – 2017-06-11 (×3): 2.5 mg via ORAL
  Filled 2017-06-09 (×3): qty 1

## 2017-06-09 MED ORDER — LOSARTAN POTASSIUM 25 MG PO TABS
25.0000 mg | ORAL_TABLET | Freq: Every day | ORAL | Status: DC
  Administered 2017-06-10 – 2017-06-11 (×2): 25 mg via ORAL
  Filled 2017-06-09 (×2): qty 1

## 2017-06-09 MED ORDER — PHENYLEPHRINE HCL 10 MG/ML IJ SOLN
INTRAVENOUS | Status: DC | PRN
  Administered 2017-06-09: 50 ug/min via INTRAVENOUS

## 2017-06-09 MED ORDER — INSULIN GLARGINE 100 UNIT/ML ~~LOC~~ SOLN: 15.0000 [IU] | Freq: Every day | SUBCUTANEOUS | Status: DC

## 2017-06-09 MED ORDER — CHLORHEXIDINE GLUCONATE 4 % EX LIQD: 60.0000 mL | Freq: Once | CUTANEOUS | Status: DC

## 2017-06-09 MED ORDER — ALUM & MAG HYDROXIDE-SIMETH 200-200-20 MG/5ML PO SUSP: 30.0000 mL | ORAL | Status: DC | PRN

## 2017-06-09 MED ORDER — PRAVASTATIN SODIUM 40 MG PO TABS
40.0000 mg | ORAL_TABLET | Freq: Every day | ORAL | Status: DC
  Administered 2017-06-10: 40 mg via ORAL
  Filled 2017-06-09: qty 1

## 2017-06-09 MED ORDER — BUPIVACAINE-EPINEPHRINE (PF) 0.5% -1:200000 IJ SOLN
INTRAMUSCULAR | Status: DC | PRN
  Administered 2017-06-09: 30 mL

## 2017-06-09 MED ORDER — VITAMIN D 1000 UNITS PO TABS
ORAL_TABLET | Freq: Every day | ORAL | Status: DC
  Administered 2017-06-09: 1000 [IU] via ORAL
  Administered 2017-06-10: 09:00:00 via ORAL
  Administered 2017-06-11: 1000 [IU] via ORAL
  Filled 2017-06-09 (×3): qty 1

## 2017-06-09 MED ORDER — FENTANYL CITRATE (PF) 100 MCG/2ML IJ SOLN
25.0000 ug | INTRAMUSCULAR | Status: DC | PRN
Start: 2017-06-09 — End: 2017-06-09
  Administered 2017-06-09 (×2): 50 ug via INTRAVENOUS

## 2017-06-09 MED ORDER — SENNA 8.6 MG PO TABS
2.0000 | ORAL_TABLET | Freq: Every day | ORAL | Status: DC
  Administered 2017-06-09 – 2017-06-10 (×2): 17.2 mg via ORAL
  Filled 2017-06-09 (×2): qty 2

## 2017-06-09 MED ORDER — LIDOCAINE 2% (20 MG/ML) 5 ML SYRINGE
INTRAMUSCULAR | Status: DC | PRN
  Administered 2017-06-09: 60 mg via INTRAVENOUS

## 2017-06-09 MED ORDER — FENTANYL CITRATE (PF) 250 MCG/5ML IJ SOLN
INTRAMUSCULAR | Status: DC | PRN
  Administered 2017-06-09 (×5): 50 ug via INTRAVENOUS

## 2017-06-09 MED ORDER — ROPIVACAINE HCL 5 MG/ML IJ SOLN
INTRAMUSCULAR | Status: DC | PRN
  Administered 2017-06-09: 30 mL via PERINEURAL

## 2017-06-09 MED ORDER — SUGAMMADEX SODIUM 500 MG/5ML IV SOLN
INTRAVENOUS | Status: DC | PRN
  Administered 2017-06-09: 300 mg via INTRAVENOUS

## 2017-06-09 MED ORDER — DOCUSATE SODIUM 100 MG PO CAPS
100.0000 mg | ORAL_CAPSULE | Freq: Two times a day (BID) | ORAL | Status: DC
  Administered 2017-06-09 – 2017-06-11 (×4): 100 mg via ORAL
  Filled 2017-06-09 (×4): qty 1

## 2017-06-09 MED ORDER — FENTANYL CITRATE (PF) 100 MCG/2ML IJ SOLN
INTRAMUSCULAR | Status: AC
  Administered 2017-06-09: 50 ug via INTRAVENOUS
  Filled 2017-06-09: qty 2

## 2017-06-09 MED ORDER — ONDANSETRON HCL 4 MG/2ML IJ SOLN
INTRAMUSCULAR | Status: AC
  Filled 2017-06-09: qty 2

## 2017-06-09 MED ORDER — ONDANSETRON HCL 4 MG/2ML IJ SOLN: 4.0000 mg | Freq: Once | INTRAMUSCULAR | Status: DC | PRN

## 2017-06-09 MED ORDER — ACETAMINOPHEN 650 MG RE SUPP: 650.0000 mg | Freq: Four times a day (QID) | RECTAL | Status: DC | PRN

## 2017-06-09 MED ORDER — FUROSEMIDE 40 MG PO TABS
40.0000 mg | ORAL_TABLET | ORAL | Status: DC
  Administered 2017-06-11: 40 mg via ORAL

## 2017-06-09 MED ORDER — ONDANSETRON HCL 4 MG/2ML IJ SOLN: 4.0000 mg | Freq: Four times a day (QID) | INTRAMUSCULAR | Status: DC | PRN

## 2017-06-09 MED ORDER — SODIUM CHLORIDE 0.9 % IV SOLN
INTRAVENOUS | Status: DC
  Administered 2017-06-09: 22:00:00 via INTRAVENOUS

## 2017-06-09 MED ORDER — SODIUM CHLORIDE 0.9 % IR SOLN
Status: DC | PRN
  Administered 2017-06-09: 1000 mL

## 2017-06-09 MED ORDER — LACTATED RINGERS IV SOLN
INTRAVENOUS | Status: DC | PRN
  Administered 2017-06-09 (×2): via INTRAVENOUS

## 2017-06-09 MED ORDER — FUROSEMIDE 20 MG PO TABS
20.0000 mg | ORAL_TABLET | ORAL | Status: DC
  Administered 2017-06-10: 20 mg via ORAL
  Filled 2017-06-09 (×2): qty 1

## 2017-06-09 MED ORDER — HYDROCODONE-ACETAMINOPHEN 5-325 MG PO TABS
1.0000 | ORAL_TABLET | ORAL | Status: DC | PRN
  Administered 2017-06-09 – 2017-06-10 (×5): 2 via ORAL
  Filled 2017-06-09 (×5): qty 2

## 2017-06-09 MED ORDER — INSULIN GLARGINE 100 UNIT/ML ~~LOC~~ SOLN
30.0000 [IU] | Freq: Every day | SUBCUTANEOUS | Status: DC
  Administered 2017-06-09 – 2017-06-10 (×2): 30 [IU] via SUBCUTANEOUS
  Filled 2017-06-09 (×3): qty 0.3

## 2017-06-09 MED ORDER — ROCURONIUM BROMIDE 10 MG/ML (PF) SYRINGE
PREFILLED_SYRINGE | INTRAVENOUS | Status: DC | PRN
Start: 2017-06-09 — End: 2017-06-09
  Administered 2017-06-09: 40 mg via INTRAVENOUS

## 2017-06-09 MED ORDER — FENTANYL CITRATE (PF) 250 MCG/5ML IJ SOLN
INTRAMUSCULAR | Status: AC
  Filled 2017-06-09: qty 5

## 2017-06-09 MED ORDER — SODIUM CHLORIDE 0.9 % IV SOLN
INTRAVENOUS | Status: DC
  Administered 2017-06-09: 15:00:00 via INTRAVENOUS

## 2017-06-09 MED ORDER — MIDAZOLAM HCL 2 MG/2ML IJ SOLN
INTRAMUSCULAR | Status: AC
  Filled 2017-06-09: qty 2

## 2017-06-09 MED ORDER — SODIUM CHLORIDE 0.9 % IV SOLN: 10.0000 mL/h | Freq: Once | INTRAVENOUS | Status: DC

## 2017-06-09 MED ORDER — HYDROMORPHONE HCL 1 MG/ML IJ SOLN
0.5000 mg | INTRAMUSCULAR | Status: DC | PRN
  Administered 2017-06-11: 1 mg via INTRAVENOUS
  Filled 2017-06-09: qty 1

## 2017-06-09 MED ORDER — TRANEXAMIC ACID 1000 MG/10ML IV SOLN
1000.0000 mg | Freq: Once | INTRAVENOUS | Status: AC
  Administered 2017-06-09: 1000 mg via INTRAVENOUS
  Filled 2017-06-09: qty 10

## 2017-06-09 MED ORDER — FENTANYL CITRATE (PF) 100 MCG/2ML IJ SOLN
INTRAMUSCULAR | Status: AC
  Filled 2017-06-09: qty 2

## 2017-06-09 SURGICAL SUPPLY — 48 items
ALCOHOL ISOPROPYL (RUBBING) (MISCELLANEOUS) ×3 IMPLANT
BANDAGE ACE 6X5 VEL STRL LF (GAUZE/BANDAGES/DRESSINGS) ×3 IMPLANT
BLADE SAW RECIP 87.9 MT (BLADE) ×3 IMPLANT
CAPT KNEE TRIATH TK-4 ×3 IMPLANT
CEMENT BONE SIMPLEX SPEEDSET (Cement) ×6 IMPLANT
CHLORAPREP W/TINT 26ML (MISCELLANEOUS) ×6 IMPLANT
CLOSURE STERI-STRIP 1/2X4 (GAUZE/BANDAGES/DRESSINGS) ×1
CLSR STERI-STRIP ANTIMIC 1/2X4 (GAUZE/BANDAGES/DRESSINGS) ×2 IMPLANT
CUFF TOURNIQUET SINGLE 34IN LL (TOURNIQUET CUFF) ×3 IMPLANT
DERMABOND ADVANCED (GAUZE/BANDAGES/DRESSINGS) ×2
DERMABOND ADVANCED .7 DNX12 (GAUZE/BANDAGES/DRESSINGS) ×1 IMPLANT
DRAIN HEMOVAC 7FR (DRAIN) ×3 IMPLANT
DRAPE SHEET LG 3/4 BI-LAMINATE (DRAPES) ×6 IMPLANT
DRAPE U-SHAPE 47X51 STRL (DRAPES) ×3 IMPLANT
DRAPE UNIVERSAL PACK (DRAPES) ×3 IMPLANT
DRSG AQUACEL AG ADV 3.5X10 (GAUZE/BANDAGES/DRESSINGS) ×3 IMPLANT
DRSG AQUACEL AG ADV 3.5X14 (GAUZE/BANDAGES/DRESSINGS) ×3 IMPLANT
DRSG TEGADERM 2-3/8X2-3/4 SM (GAUZE/BANDAGES/DRESSINGS) ×3 IMPLANT
ELECT REM PT RETURN 9FT ADLT (ELECTROSURGICAL) ×3
ELECTRODE REM PT RTRN 9FT ADLT (ELECTROSURGICAL) ×1 IMPLANT
EVACUATOR 1/8 PVC DRAIN (DRAIN) IMPLANT
GLOVE BIO SURGEON STRL SZ8.5 (GLOVE) ×6 IMPLANT
GLOVE BIOGEL PI IND STRL 8.5 (GLOVE) ×1 IMPLANT
GLOVE BIOGEL PI INDICATOR 8.5 (GLOVE) ×2
GOWN STRL REUS W/TWL 2XL LVL3 (GOWN DISPOSABLE) ×3 IMPLANT
HANDPIECE INTERPULSE COAX TIP (DISPOSABLE) ×2
KIT BASIN OR (CUSTOM PROCEDURE TRAY) ×3 IMPLANT
MANIFOLD NEPTUNE II (INSTRUMENTS) ×3 IMPLANT
NEEDLE SPNL 18GX3.5 QUINCKE PK (NEEDLE) ×6 IMPLANT
PACK TOTAL JOINT (CUSTOM PROCEDURE TRAY) ×3 IMPLANT
PACK TOTAL KNEE CUSTOM (KITS) ×3 IMPLANT
PADDING CAST COTTON 6X4 STRL (CAST SUPPLIES) ×3 IMPLANT
SAW OSC TIP CART 19.5X105X1.3 (SAW) ×3 IMPLANT
SCREW CORTEX 3.5 20MM (Screw) ×2 IMPLANT
SCREW LOCK CORT ST 3.5X20 (Screw) ×1 IMPLANT
SEALER BIPOLAR AQUA 6.0 (INSTRUMENTS) ×3 IMPLANT
SET HNDPC FAN SPRY TIP SCT (DISPOSABLE) ×1 IMPLANT
SET PAD KNEE POSITIONER (MISCELLANEOUS) ×3 IMPLANT
SUT MNCRL AB 3-0 PS2 27 (SUTURE) ×3 IMPLANT
SUT MON AB 2-0 CT1 36 (SUTURE) ×6 IMPLANT
SUT VIC AB 1 CTX 27 (SUTURE) ×3 IMPLANT
SUT VIC AB 2-0 CT1 27 (SUTURE) ×2
SUT VIC AB 2-0 CT1 TAPERPNT 27 (SUTURE) ×1 IMPLANT
SUT VLOC 180 0 24IN GS25 (SUTURE) ×3 IMPLANT
SYR 50ML LL SCALE MARK (SYRINGE) ×6 IMPLANT
TOWER CARTRIDGE SMART MIX (DISPOSABLE) ×3 IMPLANT
TRAY CATH 16FR W/PLASTIC CATH (SET/KITS/TRAYS/PACK) IMPLANT
WRAP KNEE MAXI GEL POST OP (GAUZE/BANDAGES/DRESSINGS) ×3 IMPLANT

## 2017-06-09 NOTE — H&P (View-Only) (Signed)
TOTAL KNEE ADMISSION H&P  Patient is being admitted for left total knee arthroplasty.  Subjective:  Chief Complaint:left knee pain.  HPI: Carmen Ford, 71 y.o. female, has a history of pain and functional disability in the left knee due to arthritis and has failed non-surgical conservative treatments for greater than 12 weeks to includeNSAID's and/or analgesics, corticosteriod injections, flexibility and strengthening excercises, use of assistive devices, weight reduction as appropriate and activity modification.  Onset of symptoms was gradual, starting >10 years ago with rapidlly worsening course since that time. The patient noted no past surgery on the left knee(s).  Patient currently rates pain in the left knee(s) at 10 out of 10 with activity. Patient has night pain, worsening of pain with activity and weight bearing, pain that interferes with activities of daily living, pain with passive range of motion, crepitus and joint swelling.  Patient has evidence of subchondral cysts, subchondral sclerosis, periarticular osteophytes, joint subluxation and joint space narrowing by imaging studies. There is no active infection.  Patient Active Problem List   Diagnosis Date Noted  . Chronic ischemic heart disease 05/15/2017  . CKD (chronic kidney disease) 05/15/2017  . Essential (primary) hypertension 05/15/2017  . Hyperlipidemia 05/15/2017  . 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 Restless legs syndrome 05/15/2017  . Vitamin D deficiency 05/15/2017  . Hypomagnesemia 05/09/2017  . Acute colitis 05/08/2017  . Acute on chronic kidney failure ([place]) 05/08/2017  . Preoperative cardiovascular examination 01/13/2017  . Anemia in chronic kidney disease 10/15/2016  . Fatigue 10/15/2016  . Bradycardia 02/26/2016  . Chronic diastolic heart failure ([place]) 02/26/2016  . Coronary artery disease involving native coronary artery of native heart with angina pectoris ([place]) 02/26/2016  . Hx of CABG 02/26/2016  .  Hypertensive heart disease with heart failure ([place]) 02/26/2016   Past Medical History:  Diagnosis Date  . Acute colitis 05/08/2017   Overview:  CT of the abdomen shows right colitis.  Will administer IV Flagyl and Cipro, clear liquid diet, check stool for c diff and culture.  Will recheck lactic acid for sepsis.  Was supposed to have colonoscopy recently but has not had one for years.    Last Assessment & Plan:  CT of the abdomen obtained showed right colitis.  Tolerated clear liquid diet.  States has had no further diarrhea since admission.  Patient was to have a colonoscopy recently but states has not had 1 for years.  Denies abdominal pain. Lactic acid 1.0 Plan: 5 day course oral Flagyl on discharge Diet regular cardiac/diabetic diet Stool for C diff and culture not obtained as no further diarrhea Follow up with gastroenterology service in 4 to 6 weeks for a colonoscopy Follow up with PCP in 3 to 5 days  . Acute on chronic kidney failure ([place]) 05/08/2017   Last Assessment & Plan:  Patient presented to ER with diarrhea, renal panel indicated dehydration.  Reviewed past renal panels and creatinine is elevated from baseline of about 1.5 and now 2.3. Diarrhea has resolved per patient report and she is tolerating fluids, will advance to regular diabetic, cardiac diet. Plan: Resume home lasix lasix  Bun and creatine trending down Diarrhea resolved Tolerating by mouth  . Anemia   . Anemia in chronic kidney disease 10/15/2016  . Bradycardia 02/26/2016   Overview:  Not on a beta blocker with sinus bradycardia and Mobitz 1 second degree AV block  . Chronic diastolic heart failure ([place]) 02/26/2016   Overview:  Overview:  EF normal by echo 2013  . Chronic  ischemic heart disease 05/15/2017  . CKD (chronic kidney disease) 05/15/2017  . Coronary artery disease involving native coronary artery of native heart with angina pectoris ([place]) 02/26/2016  . Diabetes mellitus without complication ([place])   . Essential (primary)  hypertension 05/15/2017  . Fatigue 10/15/2016  . 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 Hyperlipidemia 05/15/2017  . Hypertensive heart disease with heart failure ([place]) 02/26/2016  . Hypomagnesemia 05/09/2017   Last Assessment & Plan:  Magnesium level 1.7 likely secondary to diarrhea loss Plan: Replaced   . Myocardial infarction ([place])   . 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 Restless legs syndrome 05/15/2017  . Vitamin D deficiency 05/15/2017  . Wellness examination 01/13/2017    Past Surgical History:  Procedure Laterality Date  . CARDIAC SURGERY    . CORONARY ARTERY BYPASS GRAFT       (Not in a hospital admission) Allergies  Allergen Reactions  . Penicillins     tiredess  . Statins Other (See Comments)    myalgias    Social History  Substance Use Topics  . Smoking status: Never Smoker  . Smokeless tobacco: Never Used  . Alcohol use No    Family History  Problem Relation Age of Onset  . Diabetes Mother   . Hypertension Mother   . Congestive Heart Failure Father      Review of Systems  Constitutional: Negative.   HENT: Negative.   Eyes: Negative.   Respiratory: Positive for shortness of breath.   Cardiovascular: Negative.   Gastrointestinal: Negative.   Genitourinary: Negative.   Musculoskeletal: Positive for joint pain.  Skin: Positive for rash.  Neurological: Negative.   Endo/Heme/Allergies: Negative.   Psychiatric/Behavioral: Negative.     Objective:  Physical Exam  Vitals reviewed. Constitutional: She is oriented to person, place, and time. She appears well-developed and well-nourished.  HENT:  Head: Normocephalic and atraumatic.  Eyes: Pupils are equal, round, and reactive to light. Conjunctivae and EOM are normal.  Neck: Normal range of motion. Neck supple.  Cardiovascular: Normal rate, regular rhythm and intact distal pulses.   Respiratory: Effort normal. No respiratory distress.  GI: Soft. She exhibits no  distension.  Genitourinary:  Genitourinary Comments: deferred  Musculoskeletal:       Left knee: She exhibits abnormal alignment and bony tenderness. Tenderness found. Medial joint line and lateral joint line tenderness noted.       Legs: Neurological: She is alert and oriented to person, place, and time. She has normal reflexes.  Skin: Skin is warm and dry.  Psychiatric: She has a normal mood and affect. Her behavior is normal. Judgment and thought content normal.    Vital signs in last 24 hours: @VSRANGES @  Labs:   Estimated body mass index is 39.56 kg/m as calculated from the following:   Height as of 05/20/17: 5\' 6"  (1.676 m).   Weight as of 05/20/17: 111.2 kg (245 lb 1.9 oz).   Imaging Review Plain radiographs demonstrate severe degenerative joint disease of the left knee(s). The overall alignment issignificant varus. The bone quality appears to be poor for age and reported activity level.  Assessment/Plan:  End stage arthritis, left knee   The patient history, physical examination, clinical judgment of the provider and imaging studies are consistent with end stage degenerative joint disease of the left knee(s) and total knee arthroplasty is deemed medically necessary. The treatment options including medical management, injection therapy arthroscopy and arthroplasty were  discussed at length. The risks and benefits of total knee arthroplasty were presented and reviewed. The risks due to aseptic loosening, infection, stiffness, patella tracking problems, thromboembolic complications and other imponderables were discussed. The patient acknowledged the explanation, agreed to proceed with the plan and consent was signed. Patient is being admitted for inpatient treatment for surgery, pain control, PT, OT, prophylactic antibiotics, VTE prophylaxis, progressive ambulation and ADL's and discharge planning. The patient is planning to be discharged home with home health services

## 2017-06-09 NOTE — Transfer of Care (Signed)
Immediate Anesthesia Transfer of Care Note  Patient: Carmen Ford  Procedure(s) Performed: Procedure(s) with comments: LEFT TOTAL KNEE ARTHROPLASTY WITH COMPUTER NAVIGATION (Left) - NEED RNFA  Patient Location: PACU  Anesthesia Type:GA combined with regional for post-op pain  Level of Consciousness: awake, alert , oriented and patient cooperative  Airway & Oxygen Therapy: Patient Spontanous Breathing and Patient connected to nasal cannula oxygen  Post-op Assessment: Report given to RN, Post -op Vital signs reviewed and stable and Patient moving all extremities  Post vital signs: Reviewed and stable  Last Vitals:  Vitals:   06/09/17 1105 06/09/17 1120  BP: (!) 122/42 (!) 143/44  Pulse:    Resp: 20 (!) 21  Temp:      Last Pain:  Vitals:   06/09/17 1054  TempSrc:   PainSc: 9          Complications: No apparent anesthesia complications

## 2017-06-09 NOTE — Interval H&P Note (Signed)
History and Physical Interval Note:  06/09/2017 11:29 AM  Carmen Ford  has presented today for surgery, with the diagnosis of Degenerative joint disease left knee  The various methods of treatment have been discussed with the patient and family. After consideration of risks, benefits and other options for treatment, the patient has consented to  Procedure(s) with comments: LEFT TOTAL KNEE ARTHROPLASTY WITH COMPUTER NAVIGATION (Left) - NEED RNFA as a surgical intervention .  The patient's history has been reviewed, patient examined, no change in status, stable for surgery.  I have reviewed the patient's chart and labs.  Questions were answered to the patient's satisfaction.     [name], Horald Pollen

## 2017-06-09 NOTE — Anesthesia Preprocedure Evaluation (Addendum)
Anesthesia Evaluation  Patient identified by MRN, date of birth, ID band Patient awake    Reviewed: Allergy & Precautions, NPO status , Patient's Chart, lab work & pertinent test results  Airway Mallampati: III  TM Distance: >3 FB Neck ROM: Full    Dental  (+) Edentulous Upper, Edentulous Lower   Pulmonary neg pulmonary ROS,    Pulmonary exam normal breath sounds clear to auscultation       Cardiovascular hypertension, Pt. on medications + CAD, + Past MI, + CABG and +CHF  Normal cardiovascular exam Rhythm:Regular Rate:Normal  ECG: SR, 1st degree AV block, rate 65  Sees cardiologist [facility])   Echo 03/11/16 [facility] cardiology [place]): 1. Concentric LVH. Visual EF 55-60%. Doppler evidence of grade II diastolic dysfunction.  2. LA cavity moderately dilated. 3. Structurally normal mitral valve with trace regurgitation. 4. Structurally normal tricuspid valve with mild regurgitation   Neuro/Psych negative neurological ROS  negative psych ROS   GI/Hepatic negative GI ROS, Neg liver ROS,   Endo/Other  diabetes, Insulin Dependent  Renal/GU Renal InsufficiencyRenal disease     Musculoskeletal  (+) Arthritis , Osteoarthritis,    Abdominal (+) + obese,   Peds  Hematology  (+) anemia ,   Anesthesia Other Findings Hyperlipidemia RLS  Reproductive/Obstetrics                            Anesthesia Physical Anesthesia Plan  ASA: III  Anesthesia Plan: General   Post-op Pain Management:  Regional for Post-op pain   Induction: Intravenous  PONV Risk Score and Plan: 2 and Ondansetron, Dexamethasone, Propofol infusion and Midazolam  Airway Management Planned: Oral ETT  Additional Equipment:   Intra-op Plan:   Post-operative Plan:   Informed Consent: I have reviewed the patients History and Physical, chart, labs and discussed the procedure including the risks, benefits and alternatives for  the proposed anesthesia with the patient or authorized representative who has indicated his/her understanding and acceptance.   Dental advisory given  Plan Discussed with: CRNA  Anesthesia Plan Comments: (Plan switched to GA in OR when CSF no obtained)       Anesthesia Quick Evaluation

## 2017-06-09 NOTE — Anesthesia Procedure Notes (Signed)
Procedure Name: Intubation Date/Time: 06/09/2017 12:58 PM Performed by: Myna Bright Pre-anesthesia Checklist: Patient identified, Emergency Drugs available, Suction available and Patient being monitored Patient Re-evaluated:Patient Re-evaluated prior to induction Oxygen Delivery Method: Circle system utilized Preoxygenation: Pre-oxygenation with 100% oxygen Induction Type: IV induction Ventilation: Mask ventilation without difficulty and Oral airway inserted - appropriate to patient size Laryngoscope Size: Mac and 3 Grade View: Grade I Tube type: Oral Tube size: 7.5 mm Number of attempts: 1 Airway Equipment and Method: Stylet Placement Confirmation: ETT inserted through vocal cords under direct vision,  positive ETCO2 and breath sounds checked- equal and bilateral Secured at: 21 cm Tube secured with: Tape Dental Injury: Teeth and Oropharynx as per pre-operative assessment

## 2017-06-09 NOTE — Op Note (Signed)
OPERATIVE REPORT  SURGEON: Rod Can, MD   ASSISTANT: Ky Barban, RNFA.  PREOPERATIVE DIAGNOSIS: Left knee arthritis.   POSTOPERATIVE DIAGNOSIS: Left knee arthritis.   PROCEDURE: Left total knee arthroplasty.   IMPLANTS: Stryker Triathlon CR femur, size 4. Stryker universal tibia, size 3. X3 polyethelyene insert, size 9 mm, CR. 3 button asymmetric patella, size 35 mm. Simplex P bone cement. Synthes 3.5 mm cortical bone screw.  ANESTHESIA:  General and Spinal  TOURNIQUET TIME: 19 min at 320mm Hg.  ESTIMATED BLOOD LOSS: 500 mL.  BLOOD PRODUCTS: 1 unit PRBCs.  ANTIBIOTICS: 2 g Ancef.  DRAINS: Medium HV x1.  COMPLICATIONS: None   CONDITION: PACU - hemodynamically stable.   BRIEF CLINICAL NOTE: Carmen Ford is a 71 y.o. female with a long-standing history of Left knee arthritis. After failing conservative management, the patient was indicated for total knee arthroplasty. The risks, benefits, and alternatives to the procedure were explained, and the patient elected to proceed.  PROCEDURE IN DETAIL:  Adductor canal block was placed in the holding area. Once inside the operative room, spinal anesthesia was attempted however unsuccessful. General anesthesia was induced, and a foley catheter was inserted. The patient was then positioned, a nonsterile tourniquet was placed, and the lower extremity was prepped and draped in the normal sterile surgical fashion. A time-out was called verifying side and site of surgery. The patient received IV antibiotics within 60 minutes of beginning the procedure.   An anterior approach to the knee was performed utilizing a mid vastus arthrotomy. A medial release was performed and the patellar fat pad was excised. Stryker navigation was used to cut the distal femur perpendicular to the mechanical axis. A freehand patellar resection was performed,  and the patella was sized an prepared with 3 lug holes.  Nagivation was used to make a neutral proximal tibia resection, taking 9 mm of bone from the less affected lateral side with 3 degrees of slope. The menisci were excised. A spacer block was placed, and the alignment and balance in extension were confirmed.   The distal femur was sized using the 3-degree external rotation guide referencing the posterior femoral cortex. The appropriate 4-in-1 cutting block was pinned into place. Rotation was checked using Whiteside's line, the epicondylar axis, and then confirmed with a spacer block in flexion. The remaining femoral cuts were performed, taking care to protect the MCL.  The tibia was inspected. She had an uncontained defect in the posterior medial aspect of the tibia, comprising less than 30% of the surface. The tibia was sized and the trial tray was pinned into place. The remaining trail components were inserted. The knee was stable to varus and valgus stress through a full range of motion. The patella tracked centrally, and the PCL was well balanced. The trial components were removed, and the proximal tibial surface was prepared. The extremity was exsanguinated with an Esmarch, and the tourniquet was inflated. Small drill holes were made in the sclerotic subchondral bone. I placed a 3.5 mm cortical bone screw in the proximal tibial defect. The cut bony surfaces were irrigated with pulse lavage. Final tibial and patellar components were cemented into place and excess cement was cleared. The femoral component was press-fit. The trial insert was placed, and the knee was brought into extension while the cement polymerized. Once the cement was hard, the knee was tested for a final time and found to be well balanced. The trial insert was exchanged for the real polyethylene insert.  The wound was copiously  irrigated with normal saline with pulse lavage. Marcaine solution was injected into the periarticular  soft tissue. The wound was closed in layers using #1 Vicryl and V-Loc for the fascia, 2-0 Vicryl for the subcutaneous fat, 2-0 Monocryl for the deep dermal layer, 3-0 running Monocryl subcuticular Stitch, and Dermabond for the skin. Once the glue was fully dried, an Aquacell Ag and compressive dressing were applied. The tourniquet was let down, and the patient was transported to the recovery room in stable ondition. Sponge, needle, and instrument counts were correct at the end of the case x2. The patient tolerated the procedure well and there were no known complications.

## 2017-06-09 NOTE — Anesthesia Procedure Notes (Signed)
Anesthesia Regional Block: Adductor canal block   Pre-Anesthetic Checklist: ,, timeout performed, Correct Patient, Correct Site, Correct Laterality, Correct Procedure,, site marked, risks and benefits discussed, Surgical consent,  Pre-op evaluation,  At surgeon's request and post-op pain management  Laterality: Left  Prep: chloraprep       Needles:  Injection technique: Single-shot  Needle Type: Echogenic Stimulator Needle     Needle Length: 9cm  Needle Gauge: 21     Additional Needles:   Procedures: ultrasound guided,,,,,,,,  Narrative:  Start time: 06/09/2017 11:45 AM End time: 06/09/2017 11:55 AM Injection made incrementally with aspirations every 5 mL.  Performed by: Personally  Anesthesiologist: Adele Barthel P  Additional Notes: Functioning IV was confirmed and monitors were applied.  A 45mm 21ga Arrow echogenic stimulator needle was used. Sterile prep,hand hygiene and sterile gloves were used.  Negative aspiration and negative test dose prior to incremental administration of local anesthetic. The patient tolerated the procedure well.

## 2017-06-10 ENCOUNTER — Encounter (HOSPITAL_COMMUNITY): Payer: Self-pay | Admitting: Orthopedic Surgery

## 2017-06-10 LAB — BASIC METABOLIC PANEL
ANION GAP: 8 (ref 5–15)
BUN: 30 mg/dL — AB (ref 6–20)
CALCIUM: 8.1 mg/dL — AB (ref 8.9–10.3)
CO2: 26 mmol/L (ref 22–32)
Chloride: 106 mmol/L (ref 101–111)
Creatinine, Ser: 1.45 mg/dL — ABNORMAL HIGH (ref 0.44–1.00)
GFR calc Af Amer: 41 mL/min — ABNORMAL LOW (ref >60)
GFR calc non Af Amer: 35 mL/min — ABNORMAL LOW (ref >60)
GLUCOSE: 100 mg/dL — AB (ref 65–99)
Potassium: 4.2 mmol/L (ref 3.5–5.1)
Sodium: 140 mmol/L (ref 135–145)

## 2017-06-10 LAB — CBC
HEMATOCRIT: 28.2 % — AB (ref 36.0–46.0)
Hemoglobin: 9 g/dL — ABNORMAL LOW (ref 12.0–15.0)
MCH: 27 pg (ref 26.0–34.0)
MCHC: 31.9 g/dL (ref 30.0–36.0)
MCV: 84.7 fL (ref 78.0–100.0)
PLATELETS: 224 10*3/uL (ref 150–400)
RBC: 3.33 MIL/uL — ABNORMAL LOW (ref 3.87–5.11)
RDW: 14.4 % (ref 11.5–15.5)
WBC: 9.7 10*3/uL (ref 4.0–10.5)

## 2017-06-10 LAB — GLUCOSE, CAPILLARY
GLUCOSE-CAPILLARY: 193 mg/dL — AB (ref 65–99)
GLUCOSE-CAPILLARY: 162 mg/dL — AB (ref 65–99)
Glucose-Capillary: 98 mg/dL (ref 65–99)
Glucose-Capillary: 141 mg/dL — ABNORMAL HIGH (ref 65–99)

## 2017-06-10 MED ORDER — SENNA 8.6 MG PO TABS: 2.0000 | ORAL_TABLET | Freq: Every day | ORAL | 0 refills | Status: DC

## 2017-06-10 MED ORDER — DOCUSATE SODIUM 100 MG PO CAPS: 100.0000 mg | ORAL_CAPSULE | Freq: Two times a day (BID) | ORAL | 1 refills | Status: DC

## 2017-06-10 MED ORDER — ONDANSETRON HCL 4 MG PO TABS
4.0000 mg | ORAL_TABLET | Freq: Four times a day (QID) | ORAL | 0 refills | Status: DC | PRN
Start: 2017-06-10 — End: 2017-10-23

## 2017-06-10 MED ORDER — HYDROCODONE-ACETAMINOPHEN 5-325 MG PO TABS: 1.0000 | ORAL_TABLET | ORAL | 0 refills | Status: DC | PRN

## 2017-06-10 MED ORDER — ASPIRIN 81 MG PO TABS: 81.0000 mg | ORAL_TABLET | Freq: Every day | ORAL | 1 refills | Status: DC

## 2017-06-10 NOTE — Progress Notes (Signed)
Physical Therapy Treatment Patient Details Name: Carmen Ford MRN: [id number] DOB: 1946/03/22 Today's Date: 06/10/2017    History of Present Illness Pt admitted 8/6 for elective L TKA. PSH: h/o CABG PMH: CKD, bradycardia, heart failure, restless leg syndrome.    PT Comments    Pt with improved ambulation tolerance this date. con't to be apprehensive with L knee active ROM. Can achieve about 50 deg of AA knee flexion. Acute PT to con't to follow and will address stairs next session.  Follow Up Recommendations  Home health PT;Supervision/Assistance - 24 hour     Equipment Recommendations  3in1 (PT)    Recommendations for Other Services       Precautions / Restrictions Precautions Precautions: Knee Precaution Booklet Issued: Yes (comment) Precaution Comments: pt with good recall Restrictions Weight Bearing Restrictions: Yes LLE Weight Bearing: Weight bearing as tolerated    Mobility  Bed Mobility Overal bed mobility: Needs Assistance Bed Mobility: Supine to Sit     Supine to sit: Mod assist     General bed mobility comments: pt able to manage L LE but required modA for trunk elevation due to HOB flat and body habitus  Transfers Overall transfer level: Needs assistance Equipment used: Rolling walker (2 wheeled) Transfers: Sit to/from Stand Sit to Stand: Min guard         General transfer comment: v/c's for safe hand placement  Ambulation/Gait Ambulation/Gait assistance: Min guard Ambulation Distance (Feet): 150 Feet Assistive device: Rolling walker (2 wheeled) Gait Pattern/deviations: Step-through pattern Gait velocity: slow Gait velocity interpretation: Below normal speed for age/gender General Gait Details: v/c's to take slower, longer steps to emphasize L knee extension, pt with noted Leg length discrepency.    Stairs            Wheelchair Mobility    Modified Rankin (Stroke Patients Only)       Balance Overall balance assessment: Needs  assistance Sitting-balance support: Feet supported;No upper extremity supported Sitting balance-Leahy Scale: Good     Standing balance support: Bilateral upper extremity supported;During functional activity Standing balance-Leahy Scale: Fair                              Cognition Arousal/Alertness: Awake/alert Behavior During Therapy: WFL for tasks assessed/performed Overall Cognitive Status: Within Functional Limits for tasks assessed                                        Exercises Total Joint Exercises Ankle Circles/Pumps: AROM;Both;10 reps;Supine Quad Sets: AROM;Left;10 reps;Supine Heel Slides: AAROM;Left;Supine;10 reps    General Comments        Pertinent Vitals/Pain Pain Assessment: 0-10 Pain Score: 5  Pain Location: L foot Pain Descriptors / Indicators: Sore Pain Intervention(s): Monitored during session    Home Living Family/patient expects to be discharged to:: Private residence Living Arrangements: Spouse/significant other                  Prior Function            PT Goals (current goals can now be found in the care plan section) Acute Rehab PT Goals Patient Stated Goal: home Progress towards PT goals: Progressing toward goals    Frequency    7X/week      PT Plan Current plan remains appropriate    Co-evaluation  AM-PAC PT "6 Clicks" Daily Activity  Outcome Measure  Difficulty turning over in bed (including adjusting bedclothes, sheets and blankets)?: Total Difficulty moving from lying on back to sitting on the side of the bed? : A Little Difficulty sitting down on and standing up from a chair with arms (e.g., wheelchair, bedside commode, etc,.)?: A Little Help needed moving to and from a bed to chair (including a wheelchair)?: A Little Help needed walking in hospital room?: A Little Help needed climbing 3-5 steps with a railing? : A Lot 6 Click Score: 15    End of Session Equipment  Utilized During Treatment: Gait belt Activity Tolerance: Patient tolerated treatment well Patient left: in chair;with call bell/phone within reach;with family/visitor present;with nursing/sitter in room Nurse Communication: Mobility status PT Visit Diagnosis: Pain;Difficulty in walking, not elsewhere classified (R26.2) Pain - Right/Left: Left Pain - part of body: Knee     Time: 1540-1601 PT Time Calculation (min) (ACUTE ONLY): 21 min  Charges:  $Gait Training: 8-22 mins                    G Codes:       Kittie Plater, PT, DPT Pager #: [phone] Office #: [phone]    [place] 06/10/2017, 4:51 PM

## 2017-06-10 NOTE — Discharge Instructions (Signed)
°Dr. Brian Swinteck °Total Joint Specialist °[facility] °[address] °[place], [place] [address] °[phone] ° °TOTAL KNEE REPLACEMENT POSTOPERATIVE DIRECTIONS ° ° ° °Knee Rehabilitation, Guidelines Following Surgery  °Results after knee surgery are often greatly improved when you follow the exercise, range of motion and muscle strengthening exercises prescribed by your doctor. Safety measures are also important to protect the knee from further injury. Any time any of these exercises cause you to have increased pain or swelling in your knee joint, decrease the amount until you are comfortable again and slowly increase them. If you have problems or questions, call your caregiver or physical therapist for advice.  ° °WEIGHT BEARING °Weight bearing as tolerated with assist device (walker, cane, etc) as directed, use it as long as suggested by your surgeon or therapist, typically at least 4-6 weeks. ° °HOME CARE INSTRUCTIONS  °Remove items at home which could result in a fall. This includes throw rugs or furniture in walking pathways.  °Continue medications as instructed at time of discharge. °You may have some home medications which will be placed on hold until you complete the course of blood thinner medication.  °You may start showering once you are discharged home but do not submerge the incision under water. Just pat the incision dry and apply a dry gauze dressing on daily. °Walk with walker as instructed.  °You may resume a sexual relationship in one month or when given the OK by your doctor.  °· Use walker as long as suggested by your caregivers. °· Avoid periods of inactivity such as sitting longer than an hour when not asleep. This helps prevent blood clots.  °You may put full weight on your legs and walk as much as is comfortable.  °You may return to work once you are cleared by your doctor.  °Do not drive a car for 6 weeks or until released by you surgeon.  °· Do not drive while  taking narcotics.  °Wear the elastic stockings for three weeks following surgery during the day but you may remove then at night. °Make sure you keep all of your appointments after your operation with all of your doctors and caregivers. You should call the office at the above phone number and make an appointment for approximately two weeks after the date of your surgery. °Do not remove your surgical dressing. The dressing is waterproof; you may take showers in 3 days, but do not take tub baths or submerge the dressing. °Please pick up a stool softener and laxative for home use as long as you are requiring pain medications. °· ICE to the affected knee every three hours for 30 minutes at a time and then as needed for pain and swelling.  Continue to use ice on the knee for pain and swelling from surgery. You may notice swelling that will progress down to the foot and ankle.  This is normal after surgery.  Elevate the leg when you are not up walking on it.   °It is important for you to complete the blood thinner medication as prescribed by your doctor. °· Continue to use the breathing machine which will help keep your temperature down.  It is common for your temperature to cycle up and down following surgery, especially at night when you are not up moving around and exerting yourself.  The breathing machine keeps your lungs expanded and your temperature down. ° °RANGE OF MOTION AND STRENGTHENING EXERCISES  °Rehabilitation of the knee is important following a   knee injury or an operation. After just a few days of immobilization, the muscles of the thigh which control the knee become weakened and shrink (atrophy). Knee exercises are designed to build up the tone and strength of the thigh muscles and to improve knee motion. Often times heat used for twenty to thirty minutes before working out will loosen up your tissues and help with improving the range of motion but do not use heat for the first two weeks following  surgery. These exercises can be done on a training (exercise) mat, on the floor, on a table or on a bed. Use what ever works the best and is most comfortable for you Knee exercises include:  °Leg Lifts - While your knee is still immobilized in a splint or cast, you can do straight leg raises. Lift the leg to 60 degrees, hold for 3 sec, and slowly lower the leg. Repeat 10-20 times 2-3 times daily. Perform this exercise against resistance later as your knee gets better.  °Quad and Hamstring Sets - Tighten up the muscle on the front of the thigh (Quad) and hold for 5-10 sec. Repeat this 10-20 times hourly. Hamstring sets are done by pushing the foot backward against an object and holding for 5-10 sec. Repeat as with quad sets.  °A rehabilitation program following serious knee injuries can speed recovery and prevent re-injury in the future due to weakened muscles. Contact your doctor or a physical therapist for more information on knee rehabilitation.  ° °SKILLED REHAB INSTRUCTIONS: °If the patient is transferred to a skilled rehab facility following release from the hospital, a list of the current medications will be sent to the facility for the patient to continue.  When discharged from the skilled rehab facility, please have the facility set up the patient's Home Health Physical Therapy prior to being released. Also, the skilled facility will be responsible for providing the patient with their medications at time of release from the facility to include their pain medication, the muscle relaxants, and their blood thinner medication. If the patient is still at the rehab facility at time of the two week follow up appointment, the skilled rehab facility will also need to assist the patient in arranging follow up appointment in our office and any transportation needs. ° °MAKE SURE YOU:  °Understand these instructions.  °Will watch your condition.  °Will get help right away if you are not doing well or get worse.   ° ° °Pick up stool softner and laxative for home use following surgery while on pain medications. °Do NOT remove your dressing. You may shower.  °Do not take tub baths or submerge incision under water. °May shower starting three days after surgery. °Please use a clean towel to pat the incision dry following showers. °Continue to use ice for pain and swelling after surgery. °Do not use any lotions or creams on the incision until instructed by your surgeon. °____________ ° °Information on my medicine - ELIQUIS® (apixaban) ° °This medication education was reviewed with me or my healthcare representative as part of my discharge preparation.  °Why was Eliquis® prescribed for you? °Eliquis® was prescribed for you to reduce the risk of blood clots forming after orthopedic surgery.   ° °What do You need to know about Eliquis®? °Take your Eliquis® TWICE DAILY - one tablet in the morning and one tablet in the evening with or without food.  It would be best to take the dose about the same time each day. ° °If   you have difficulty swallowing the tablet whole please discuss with your pharmacist how to take the medication safely. ° °Take Eliquis® exactly as prescribed by your doctor and DO NOT stop taking Eliquis® without talking to the doctor who prescribed the medication.  Stopping without other medication to take the place of Eliquis® may increase your risk of developing a clot. ° °After discharge, you should have regular check-up appointments with your healthcare provider that is prescribing your Eliquis®. ° °What do you do if you miss a dose? °If a dose of ELIQUIS® is not taken at the scheduled time, take it as soon as possible on the same day and twice-daily administration should be resumed.  The dose should not be doubled to make up for a missed dose.  Do not take more than one tablet of ELIQUIS at the same time. ° °Important Safety Information °A possible side effect of Eliquis® is bleeding. You should call your  healthcare provider right away if you experience any of the following: °? Bleeding from an injury or your nose that does not stop. °? Unusual colored urine (red or dark brown) or unusual colored stools (red or black). °? Unusual bruising for unknown reasons. °? A serious fall or if you hit your head (even if there is no bleeding). ° °Some medicines may interact with Eliquis® and might increase your risk of bleeding or clotting while on Eliquis®. To help avoid this, consult your healthcare provider or pharmacist prior to using any new prescription or non-prescription medications, including herbals, vitamins, non-steroidal anti-inflammatory drugs (NSAIDs) and supplements. ° °This website has more information on Eliquis® (apixaban): http://www.eliquis.com/eliquis/home ° ° °

## 2017-06-10 NOTE — Progress Notes (Signed)
   Subjective:  Patient reports pain as mild to moderate.  Denies N/V/CP/SOB.  Objective:   VITALS:   Vitals:   06/09/17 2147 06/10/17 0108 06/10/17 0556 06/10/17 0852  BP: (!) 124/52 (!) 138/54 (!) 122/50 (!) 134/53  Pulse: 71 73 66 71  Resp:  18 18   Temp: 98.4 F (36.9 C) 98.4 F (36.9 C) 98.2 F (36.8 C)   TempSrc: Oral Oral Oral   SpO2: 100% 95% 93% 93%  Weight:      Height:        NAD ABD soft Sensation intact distally Intact pulses distally Dorsiflexion/Plantar flexion intact Incision: dressing C/D/I Compartment soft   Lab Results  Component Value Date   WBC 9.7 06/10/2017   HGB 9.0 (L) 06/10/2017   HCT 28.2 (L) 06/10/2017   MCV 84.7 06/10/2017   PLT 224 06/10/2017   BMET    Component Value Date/Time   NA 140 06/10/2017 0735   K 4.2 06/10/2017 0735   CL 106 06/10/2017 0735   CO2 26 06/10/2017 0735   GLUCOSE 100 (H) 06/10/2017 0735   BUN 30 (H) 06/10/2017 0735   CREATININE 1.45 (H) 06/10/2017 0735   CALCIUM 8.1 (L) 06/10/2017 0735   GFRNONAA 35 (L) 06/10/2017 0735   GFRAA 41 (L) 06/10/2017 0735     Assessment/Plan: 1 Day Post-Op   Principal Problem:   Osteoarthritis of left knee Active Problems:   Degenerative arthritis of left knee   WBAT with walker DVT ppx: ASA, SCDs, TEDs Po pain control PT/OT ABLA on chronic anemia: hgb 9.0 today, cardiology wants to keep above 9 Dispo: recheck hgb in am, will d/c home when medically ready with HHPT   Lorijean Husser, Horald Pollen 06/10/2017, 1:25 PM   Rod Can, MD Cell [phone]

## 2017-06-10 NOTE — Evaluation (Signed)
Physical Therapy Evaluation Patient Details Name: Carmen Ford MRN: [id number] DOB: 1946/10/05 Today's Date: 06/10/2017   History of Present Illness  Pt admitted 8/6 for elective L TKA. PSH: h/o CABG PMH: CKD, bradycardia, heart failure, restless leg syndrome.  Clinical Impression  Pt is s/p TKA resulting in the deficits listed below (see PT Problem List). Pt with + SOB with amb however pt SpO2 at>91% on RA. Pt amb tolerance limited by L knee pain.  Pt will benefit from skilled PT to increase their independence and safety with mobility to allow discharge to the venue listed below.       Follow Up Recommendations Home health PT;Supervision/Assistance - 24 hour    Equipment Recommendations  3in1 (PT)    Recommendations for Other Services       Precautions / Restrictions Precautions Precautions: Knee Precaution Booklet Issued: No Precaution Comments: pt with verbal understanding not to place anything under knee Restrictions Weight Bearing Restrictions: Yes LLE Weight Bearing: Weight bearing as tolerated      Mobility  Bed Mobility Overal bed mobility: Needs Assistance Bed Mobility: Supine to Sit     Supine to sit: HOB elevated;Min assist     General bed mobility comments: minA for L LE management, increased time, use of bed rail  Transfers Overall transfer level: Needs assistance Equipment used: Rolling walker (2 wheeled) Transfers: Sit to/from Stand Sit to Stand: Min assist         General transfer comment: v/c's for safe hand placement, increased time, minA to maintain balance during transition of hands  Ambulation/Gait Ambulation/Gait assistance: Min assist Ambulation Distance (Feet): 75 Feet Assistive device: Rolling walker (2 wheeled) Gait Pattern/deviations: Step-to pattern;Decreased stride length;Antalgic Gait velocity: slow Gait velocity interpretation: Below normal speed for age/gender General Gait Details: pt with decreased L LE WBing tolerance, unable  to transition to reciprocal gait pattern, dependent on UEs, PTA pt amb on oustside of L foot  Stairs            Wheelchair Mobility    Modified Rankin (Stroke Patients Only)       Balance Overall balance assessment: Needs assistance Sitting-balance support: Feet supported;No upper extremity supported Sitting balance-Leahy Scale: Good     Standing balance support: Bilateral upper extremity supported Standing balance-Leahy Scale: Poor Standing balance comment: dependent on RW                             Pertinent Vitals/Pain Pain Assessment: 0-10 Pain Score: 5  Pain Location: L foot, also remains slightly numb Pain Descriptors / Indicators: Numbness Pain Intervention(s): Monitored during session    Home Living Family/patient expects to be discharged to:: Private residence Living Arrangements: Spouse/significant other Available Help at Discharge: Family;Available 24 hours/day (most of the time) Type of Home: House Home Access: Stairs to enter Entrance Stairs-Rails: None Entrance Stairs-Number of Steps: 2 Home Layout: One level Home Equipment: Shower seat - built in;Grab bars - tub/shower;Cane - single point;Walker - 2 wheels      Prior Function Level of Independence: Independent with assistive device(s)         Comments: using RW for mobility      Hand Dominance   Dominant Hand: Right    Extremity/Trunk Assessment   Upper Extremity Assessment Upper Extremity Assessment: Defer to OT evaluation    Lower Extremity Assessment Lower Extremity Assessment: LLE deficits/detail LLE Deficits / Details: pt able to initiate quad set and knee flexion to 30 deg  LLE Sensation: decreased light touch (in bottom of foot)    Cervical / Trunk Assessment Cervical / Trunk Assessment: Normal  Communication   Communication: No difficulties  Cognition Arousal/Alertness: Awake/alert Behavior During Therapy: WFL for tasks assessed/performed Overall Cognitive  Status: Within Functional Limits for tasks assessed                                        General Comments General comments (skin integrity, edema, etc.): L knee in ace wrap, hemovac drain noted and intact    Exercises Total Joint Exercises Ankle Circles/Pumps: AROM;Both;10 reps;Supine Quad Sets: AROM;Left;10 reps;Supine Heel Slides: AAROM;Left;5 reps;Supine   Assessment/Plan    PT Assessment Patient needs continued PT services  PT Problem List Decreased strength;Decreased range of motion;Decreased activity tolerance;Decreased balance;Decreased mobility;Pain       PT Treatment Interventions DME instruction;Gait training;Stair training;Functional mobility training;Therapeutic activities;Therapeutic exercise;Balance training    PT Goals (Current goals can be found in the Care Plan section)  Acute Rehab PT Goals Patient Stated Goal: home PT Goal Formulation: With patient Time For Goal Achievement: 06/17/17 Potential to Achieve Goals: Good    Frequency 7X/week   Barriers to discharge        Co-evaluation               AM-PAC PT "6 Clicks" Daily Activity  Outcome Measure Difficulty turning over in bed (including adjusting bedclothes, sheets and blankets)?: Total Difficulty moving from lying on back to sitting on the side of the bed? : A Little Difficulty sitting down on and standing up from a chair with arms (e.g., wheelchair, bedside commode, etc,.)?: A Little Help needed moving to and from a bed to chair (including a wheelchair)?: A Little Help needed walking in hospital room?: A Little Help needed climbing 3-5 steps with a railing? : A Lot 6 Click Score: 15    End of Session Equipment Utilized During Treatment: Gait belt Activity Tolerance: Patient tolerated treatment well Patient left: in chair;with call bell/phone within reach;with family/visitor present;with nursing/sitter in room Nurse Communication: Mobility status PT Visit Diagnosis:  Pain;Difficulty in walking, not elsewhere classified (R26.2) Pain - Right/Left: Left Pain - part of body: Knee    Time: 5956-3875 PT Time Calculation (min) (ACUTE ONLY): 21 min   Charges:   PT Evaluation $PT Eval Moderate Complexity: 1 Mod     PT G CodesKittie Plater, PT, DPT Pager #: [phone] Office #: [phone]   [place] 06/10/2017, 12:06 PM

## 2017-06-10 NOTE — Anesthesia Postprocedure Evaluation (Signed)
Anesthesia Post Note  Patient: Carmen Ford  Procedure(s) Performed: Procedure(s) (LRB): LEFT TOTAL KNEE ARTHROPLASTY WITH COMPUTER NAVIGATION (Left)     Patient location during evaluation: PACU Anesthesia Type: Regional Level of consciousness: awake and alert Pain management: pain level controlled Vital Signs Assessment: post-procedure vital signs reviewed and stable Respiratory status: spontaneous breathing and respiratory function stable Cardiovascular status: blood pressure returned to baseline and stable Postop Assessment: no headache, no backache and spinal receding Anesthetic complications: no    Last Vitals:  Vitals:   06/10/17 0556 06/10/17 0852  BP: (!) 122/50 (!) 134/53  Pulse: 66 71  Resp: 18   Temp: 36.8 C     Last Pain:  Vitals:   06/10/17 0857  TempSrc:   PainSc: 4                  Montez Hageman

## 2017-06-10 NOTE — Evaluation (Signed)
Occupational Therapy Evaluation Patient Details Name: Carmen Ford MRN: [id number] DOB: Feb 11, 1946 Today's Date: 06/10/2017    History of Present Illness Pt admitted 8/6 for elective L TKA. PSH: h/o CABG PMH: CKD, bradycardia, heart failure, restless leg syndrome.   Clinical Impression   This 71 y/o F presents with the above. Pt lives at home with spouse, and at baseline is mod independent with ADLs and functional mobility using RW. Pt currently requires MinA for functional mobility with RW, requires MaxA for LB ADLs. Pt will benefit from continued OT services while in acute setting to maximize Pt's safety and independence with ADLs and functional mobility prior to discharge home. Will follow.     Follow Up Recommendations  Supervision/Assistance - 24 hour;No OT follow up    Equipment Recommendations  3 in 1 bedside commode           Precautions / Restrictions Precautions Precautions: Knee Precaution Booklet Issued: No Precaution Comments: verbally reviewed knee precautions  Restrictions Weight Bearing Restrictions: Yes LLE Weight Bearing: Weight bearing as tolerated      Mobility Bed Mobility Overal bed mobility: Needs Assistance Bed Mobility: Supine to Sit     Supine to sit: HOB elevated;Min assist     General bed mobility comments: minA for L LE management, increased time, use of bed rail  Transfers Overall transfer level: Needs assistance Equipment used: Rolling walker (2 wheeled) Transfers: Sit to/from Stand Sit to Stand: Min assist         General transfer comment: v/c's for safe hand placement, increased time, minA to rise and steady     Balance Overall balance assessment: Needs assistance Sitting-balance support: Feet supported;No upper extremity supported Sitting balance-Leahy Scale: Good     Standing balance support: Bilateral upper extremity supported;During functional activity Standing balance-Leahy Scale: Poor Standing balance comment: dependent  on RW during mobility; Pt is able to stand at sink to wash hands with Min steady assist from therapist                            ADL either performed or assessed with clinical judgement   ADL Overall ADL's : Needs assistance/impaired Eating/Feeding: Set up;Sitting   Grooming: Wash/dry hands;Standing;Minimal assistance   Upper Body Bathing: Min guard;Sitting   Lower Body Bathing: Moderate assistance;Sit to/from stand   Upper Body Dressing : Min guard;Sitting   Lower Body Dressing: Maximal assistance;Sit to/from stand   Toilet Transfer: Moderate assistance;Ambulation;Comfort height toilet;RW;Grab bars   Toileting- Clothing Manipulation and Hygiene: Minimal assistance;Sit to/from stand Toileting - Clothing Manipulation Details (indicate cue type and reason): assist for gown management      Functional mobility during ADLs: Minimal assistance;Rolling walker                           Pertinent Vitals/Pain Pain Assessment: Faces Pain Score: 5  Faces Pain Scale: Hurts a little bit Pain Location: L foot  Pain Descriptors / Indicators: Aching Pain Intervention(s): Limited activity within patient's tolerance;Repositioned;Monitored during session     Hand Dominance Right   Extremity/Trunk Assessment Upper Extremity Assessment Upper Extremity Assessment: Overall WFL for tasks assessed   Lower Extremity Assessment Lower Extremity Assessment: Defer to PT evaluation LLE Deficits / Details: pt able to initiate quad set and knee flexion to 30 deg LLE Sensation: decreased light touch (in bottom of foot)   Cervical / Trunk Assessment Cervical / Trunk Assessment: Normal   Communication  Communication Communication: No difficulties   Cognition Arousal/Alertness: Awake/alert Behavior During Therapy: WFL for tasks assessed/performed Overall Cognitive Status: Within Functional Limits for tasks assessed                                     General  Comments  L knee in ace wrap, hemovac drain noted and intact               Home Living Family/patient expects to be discharged to:: Private residence Living Arrangements: Spouse/significant other Available Help at Discharge: Family;Available 24 hours/day (most of the time) Type of Home: House Home Access: Stairs to enter [employer] of Steps: 2 Entrance Stairs-Rails: None Home Layout: One level     Bathroom Shower/Tub: Occupational psychologist: Handicapped height     Home Equipment: Shower seat - built in;Grab bars - tub/shower;Cane - single point;Walker - 2 wheels          Prior Functioning/Environment Level of Independence: Independent with assistive device(s)        Comments: using RW for mobility         OT Problem List: Decreased strength;Impaired balance (sitting and/or standing);Decreased activity tolerance;Decreased knowledge of use of DME or AE      OT Treatment/Interventions: Self-care/ADL training;DME and/or AE instruction;Therapeutic activities;Balance training;Therapeutic exercise;Neuromuscular education;Patient/family education    OT Goals(Current goals can be found in the care plan section) Acute Rehab OT Goals Patient Stated Goal: home OT Goal Formulation: With patient Time For Goal Achievement: 06/24/17 Potential to Achieve Goals: Good  OT Frequency: Min 2X/week                             AM-PAC PT "6 Clicks" Daily Activity     Outcome Measure Help from another person eating meals?: None Help from another person taking care of personal grooming?: A Little Help from another person toileting, which includes using toliet, bedpan, or urinal?: A Lot Help from another person bathing (including washing, rinsing, drying)?: A Lot Help from another person to put on and taking off regular upper body clothing?: A Little Help from another person to put on and taking off regular lower body clothing?: A Lot 6 Click Score:  16   End of Session Equipment Utilized During Treatment: Gait belt;Rolling walker Nurse Communication: Mobility status  Activity Tolerance: Patient tolerated treatment well Patient left: in bed;with call bell/phone within reach;with family/visitor present  OT Visit Diagnosis: Unsteadiness on feet (R26.81);Muscle weakness (generalized) (M62.81)                Time: 0981-1914 OT Time Calculation (min): 23 min Charges:  OT General Charges $OT Visit: 1 Procedure OT Evaluation $OT Eval Low Complexity: 1 Procedure G-Codes:     Lou Cal, OT Pager [phone] 06/10/2017   [name] 06/10/2017, 12:53 PM

## 2017-06-11 LAB — CBC
HCT: 27.8 % — ABNORMAL LOW (ref 36.0–46.0)
HEMOGLOBIN: 9.1 g/dL — AB (ref 12.0–15.0)
MCH: 27.8 pg (ref 26.0–34.0)
MCHC: 32.7 g/dL (ref 30.0–36.0)
MCV: 85 fL (ref 78.0–100.0)
Platelets: 243 10*3/uL (ref 150–400)
RBC: 3.27 MIL/uL — ABNORMAL LOW (ref 3.87–5.11)
RDW: 14.6 % (ref 11.5–15.5)
WBC: 10.7 10*3/uL — ABNORMAL HIGH (ref 4.0–10.5)

## 2017-06-11 LAB — BASIC METABOLIC PANEL
Anion gap: 9 (ref 5–15)
BUN: 26 mg/dL — AB (ref 6–20)
CALCIUM: 8.2 mg/dL — AB (ref 8.9–10.3)
CHLORIDE: 104 mmol/L (ref 101–111)
CO2: 25 mmol/L (ref 22–32)
CREATININE: 1.35 mg/dL — AB (ref 0.44–1.00)
GFR calc non Af Amer: 38 mL/min — ABNORMAL LOW (ref >60)
GFR, EST AFRICAN AMERICAN: 45 mL/min — AB (ref >60)
Glucose, Bld: 116 mg/dL — ABNORMAL HIGH (ref 65–99)
Potassium: 4.5 mmol/L (ref 3.5–5.1)
SODIUM: 138 mmol/L (ref 135–145)

## 2017-06-11 LAB — GLUCOSE, CAPILLARY
GLUCOSE-CAPILLARY: 132 mg/dL — AB (ref 65–99)
GLUCOSE-CAPILLARY: 149 mg/dL — AB (ref 65–99)
Glucose-Capillary: 113 mg/dL — ABNORMAL HIGH (ref 65–99)

## 2017-06-11 MED ORDER — HYDROCODONE-ACETAMINOPHEN 5-325 MG PO TABS
1.0000 | ORAL_TABLET | ORAL | Status: DC | PRN
  Filled 2017-06-11: qty 2

## 2017-06-11 NOTE — Care Management Note (Signed)
Case Management Note  Patient Details  Name: Carmen Ford MRN: [id number] Date of Birth: Feb 14, 1946  Subjective/Objective:     71 yr old female s/p left total knee arthroplasty.               Action/Plan: Case manager spoke with patient concerning discharge plan. Referral was called to [facility], they will begin start of care on Tuesday 8/14 or Wednesday 06/18/2017. Case manager contacted Dr. Lyla Glassing for approval for start date, he is aware. She will have family support at discharge, has DME.   Expected Discharge Date:  06/11/17               Expected Discharge Plan:  [place]  In-House Referral:  NA  Discharge planning Services  CM Consult  Post Acute Care Choice:  Home Health Choice offered to:  Patient, Spouse  DME Arranged:  3-N-1 DME Agency:  [place]:  PT [place]:  [place]  Status of Service:  Completed, signed off  If discussed at [place] of Stay Meetings, dates discussed:    Additional Comments:  Ninfa Meeker, RN 06/11/2017, 2:32 PM

## 2017-06-11 NOTE — Progress Notes (Signed)
Occupational Therapy Treatment Patient Details Name: Carmen Ford MRN: [id number] DOB: [birth year] Today's Date: 06/11/2017    History of present illness Pt admitted 8/6 for elective L TKA. PSH: h/o CABG PMH: CKD, bradycardia, heart failure, restless leg syndrome.   OT comments  Pt progressing well towards goals. Completed room level functional mobility using RW, functional mobility transfers, and standing ADLs with MinGuard assist throughout. Education provided and questions answered throughout. Will continue to follow while in acute setting to maximize Pt's safety and independence with ADLs and functional mobility prior to return home.    Follow Up Recommendations  Supervision/Assistance - 24 hour;No OT follow up    Equipment Recommendations  3 in 1 bedside commode          Precautions / Restrictions Precautions Precautions: Knee;Fall Precaution Comments: reviewed with Pt  Restrictions Weight Bearing Restrictions: Yes LLE Weight Bearing: Weight bearing as tolerated       Mobility Bed Mobility Overal bed mobility: Independent Bed Mobility: Supine to Sit     Supine to sit: HOB elevated;Supervision     General bed mobility comments: supervision for safety  Transfers Overall transfer level: Needs assistance Equipment used: Rolling walker (2 wheeled) Transfers: Sit to/from Stand Sit to Stand: Min guard         General transfer comment: Min guard for safety. Pt did not require Vcs for hand placement today     Balance Overall balance assessment: Needs assistance Sitting-balance support: Feet supported;No upper extremity supported Sitting balance-Leahy Scale: Good     Standing balance support: Bilateral upper extremity supported;During functional activity Standing balance-Leahy Scale: Fair Standing balance comment: Pt utilizing UE support on RW during mobility; stands at sink to wash hands with no LOB                            ADL either performed or  assessed with clinical judgement   ADL Overall ADL's : Needs assistance/impaired     Grooming: Wash/dry hands;Min Dispensing optician: Min guard;Ambulation;BSC;RW Toilet Transfer Details (indicate cue type and reason): BSC over toilet  Toileting- Clothing Manipulation and Hygiene: Min guard;Sit to/from stand   Tub/ Shower Transfer: Walk-in shower;Min Engineer, drilling Details (indicate cue type and reason): Simulated; Pt completed x2 during session with min verbal cues for sequencing/technique with Pt demonstrating good carryover; minGuard during transfer for safety Functional mobility during ADLs: Min guard;Rolling walker                         Cognition Arousal/Alertness: Awake/alert Behavior During Therapy: WFL for tasks assessed/performed Overall Cognitive Status: Within Functional Limits for tasks assessed                                                           Pertinent Vitals/ Pain       Pain Assessment: Faces Pain Score: 9  Faces Pain Scale: Hurts little more Pain Location: Lt knee incision site  Pain Descriptors / Indicators: Sore Pain Intervention(s): Limited activity within patient's tolerance;Repositioned;Monitored during session  Frequency  Min 2X/week        Progress Toward Goals  OT Goals(current goals can now be found in the care plan section)  Progress towards OT goals: Progressing toward goals  Acute Rehab OT Goals Patient Stated Goal: home OT Goal Formulation: With patient Time For Goal Achievement: 06/24/17 Potential to Achieve Goals: Good  Plan Discharge plan remains appropriate                     AM-PAC PT "6 Clicks" Daily Activity     Outcome Measure   Help from another person eating meals?: None Help from another person taking care of personal  grooming?: A Little Help from another person toileting, which includes using toliet, bedpan, or urinal?: A Little Help from another person bathing (including washing, rinsing, drying)?: A Lot Help from another person to put on and taking off regular upper body clothing?: None Help from another person to put on and taking off regular lower body clothing?: A Lot 6 Click Score: 18    End of Session Equipment Utilized During Treatment: Gait belt;Rolling walker  OT Visit Diagnosis: Unsteadiness on feet (R26.81);Muscle weakness (generalized) (M62.81)   Activity Tolerance Patient tolerated treatment well   Patient Left in chair;with call bell/phone within reach;Other (comment) (handoff to PT )             Time: 0802-0823 OT Time Calculation (min): 21 min  Charges: OT General Charges $OT Visit: 1 Procedure OT Treatments $Self Care/Home Management : 8-22 mins   Carmen Ford, OT Pager [phone] 06/11/2017    Carmen Ford 06/11/2017, 9:08 AM

## 2017-06-11 NOTE — Discharge Summary (Signed)
Physician Discharge Summary  Patient ID: Carmen Ford MRN: [id number] DOB/AGE: [birth year] 71 y.o.  Admit date: 06/09/2017 Discharge date: 06/11/2017  Admission Diagnoses:  Osteoarthritis of left knee  Discharge Diagnoses:  Principal Problem:   Osteoarthritis of left knee Active Problems:   Degenerative arthritis of left knee   Past Medical History:  Diagnosis Date  . Anemia   . Anemia in chronic kidney disease 10/15/2016  . Bradycardia    Overview:  Not on a beta blocker with sinus bradycardia and Mobitz 1 second degree AV block  . Chronic diastolic heart failure ([place]) 02/26/2016   Overview:  Overview:  EF normal by echo 2013  . Chronic ischemic heart disease   . CKD (chronic kidney disease) 05/15/2017  . Colitis, acute   . Coronary artery disease    CABG  2011: LTA to LAD, SVG to M, SVG to PDA EF 40%  . Diabetes mellitus without complication ([place])   . Essential (primary) hypertension 05/15/2017  . Fatigue 10/15/2016  . Hyperlipidemia 05/15/2017  . Hypertensive heart disease with heart failure ([place])   . Myocardial infarction ([place])   . 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 Restless legs syndrome 05/15/2017  . Vitamin D deficiency 05/15/2017    Surgeries: Procedure(s): LEFT TOTAL KNEE ARTHROPLASTY WITH COMPUTER NAVIGATION on 06/09/2017   Consultants (if any):   Discharged Condition: Improved  Hospital Course: Carmen Ford is an 71 y.o. female who was admitted 06/09/2017 with a diagnosis of Osteoarthritis of left knee and went to the operating room on 06/09/2017 and underwent the above named procedures.  She received 1 unit PRBCs intraoperatively due to preop chronic anemia (hgb ~10) and cardiology recommendation to keep hgb above 9.   She was given perioperative antibiotics:  Anti-infectives    Start     Dose/Rate Route Frequency Ordered Stop   06/09/17 1900  clindamycin (CLEOCIN) IVPB 600 mg     600 mg 100 mL/hr over 30 Minutes Intravenous Every 6 hours 06/09/17 1745 06/10/17 0801    06/09/17 0915  clindamycin (CLEOCIN) IVPB 900 mg     900 mg 100 mL/hr over 30 Minutes Intravenous On call to O.R. 06/09/17 0915 06/09/17 1301    .  She was given sequential compression devices, early ambulation, and ASA for DVT prophylaxis.  She benefited maximally from the hospital stay and there were no complications.    Recent vital signs:  Vitals:   06/10/17 2047 06/11/17 0355  BP: (!) 132/50 (!) 144/51  Pulse: 70 76  Resp: 18 18  Temp: 98.3 F (36.8 C) 98.3 F (36.8 C)    Recent laboratory studies:  Lab Results  Component Value Date   HGB 9.1 (L) 06/11/2017   HGB 9.0 (L) 06/10/2017   HGB 10.3 (L) 05/29/2017   Lab Results  Component Value Date   WBC 10.7 (H) 06/11/2017   PLT 243 06/11/2017   No results found for: INR Lab Results  Component Value Date   NA 138 06/11/2017   K 4.5 06/11/2017   CL 104 06/11/2017   CO2 25 06/11/2017   BUN 26 (H) 06/11/2017   CREATININE 1.35 (H) 06/11/2017   GLUCOSE 116 (H) 06/11/2017    Discharge Medications:   Allergies as of 06/11/2017      Reactions   Penicillins    tiredess   Statins Other (See Comments)   myalgias      Medication List    TAKE these medications   amLODipine 10 MG tablet Commonly known as:  NORVASC Take 10  mg by mouth daily.   aspirin 81 MG tablet Take 1 tablet (81 mg total) by mouth daily.   docusate sodium 100 MG capsule Commonly known as:  COLACE Take 1 capsule (100 mg total) by mouth 2 (two) times daily.   EQL VITAMIN D3 2000 units Caps Generic drug:  Cholecalciferol Take 2,000 mg by mouth daily.   furosemide 20 MG tablet Commonly known as:  LASIX Take 40 mg by mouth 2 (two) times daily. On Monday, Wed, and Friday   furosemide 20 MG tablet Commonly known as:  LASIX Take 1 tablet by mouth daily. Takes one pill on Tues, Thurs, Sat and Sun   gabapentin 400 MG capsule Commonly known as:  NEURONTIN Take 400 mg by mouth 3 (three) times daily.   HYDROcodone-acetaminophen 5-325 MG  tablet Commonly known as:  NORCO/VICODIN Take 1-2 tablets by mouth every 4 (four) hours as needed (breakthrough pain).   insulin glargine 100 UNIT/ML injection Commonly known as:  LANTUS Inject 15-30 Units into the skin at bedtime. If BS is over 100, take 30 units. If BS is under 100 units, take 15 units   insulin lispro 100 UNIT/ML injection Commonly known as:  HUMALOG Inject 2-19 Units into the skin 3 (three) times daily with meals.   losartan 25 MG tablet Commonly known as:  COZAAR Take 1 tablet by mouth daily.   omega-3 acid ethyl esters 1 g capsule Commonly known as:  LOVAZA Take 2 g by mouth 2 (two) times daily.   ondansetron 4 MG tablet Commonly known as:  ZOFRAN Take 1 tablet (4 mg total) by mouth every 6 (six) hours as needed for nausea.   pravastatin 40 MG tablet Commonly known as:  PRAVACHOL Take 40 mg by mouth daily.   rOPINIRole 2 MG tablet Commonly known as:  REQUIP Take 2 mg by mouth at bedtime.   senna 8.6 MG Tabs tablet Commonly known as:  SENOKOT Take 2 tablets (17.2 mg total) by mouth at bedtime.            Durable Medical Equipment        Start     Ordered   06/09/17 1746  DME Walker rolling  Once    Question:  Patient needs a walker to treat with the following condition  Answer:  S/P total knee arthroplasty, left   06/09/17 1745   06/09/17 1746  DME 3 n 1  Once     06/09/17 1745      Diagnostic Studies: Dg Knee Left Port  Result Date: 06/09/2017 CLINICAL DATA:  Left total knee arthroplasty. EXAM: PORTABLE LEFT KNEE - 1-2 VIEW COMPARISON:  None. FINDINGS: Two views study shows the patient be status post tricompartmental knee replacement. No evidence for immediate hardware complications. Gas in the joint space and overlying soft tissues is compatible with the recent surgery. Surgical drain noted anteriorly. IMPRESSION: Status post tricompartmental knee replacement without evidence for immediate hardware complications. Electronically Signed    By: Misty Stanley M.D.   On: 06/09/2017 16:02    Disposition: Final discharge disposition not confirmed  Discharge Instructions    Call MD / Call 911    Complete by:  As directed    If you experience chest pain or shortness of breath, CALL 911 and be transported to the hospital emergency room.  If you develope a fever above 101 F, pus (white drainage) or increased drainage or redness at the wound, or calf pain, call your surgeon's office.   Constipation  Prevention    Complete by:  As directed    Drink plenty of fluids.  Prune juice may be helpful.  You may use a stool softener, such as Colace (over the counter) 100 mg twice a day.  Use MiraLax (over the counter) for constipation as needed.   Diet - low sodium heart healthy    Complete by:  As directed    Do not put a pillow under the knee. Place it under the heel.    Complete by:  As directed    Driving restrictions    Complete by:  As directed    No driving for 6 weeks   Increase activity slowly as tolerated    Complete by:  As directed    Lifting restrictions    Complete by:  As directed    No lifting for 6 weeks   TED hose    Complete by:  As directed    Use stockings (TED hose) for 2 weeks on both leg(s).  You may remove them at night for sleeping.      Follow-up Information    Naiah Donahoe, Aaron Edelman, MD. Schedule an appointment as soon as possible for a visit in 2 weeks.   Specialty:  Orthopedic Surgery Why:  For wound re-check Contact information: [address]. [address] [address] [phone]            Signed: Elie Goody 06/11/2017, 7:40 AM

## 2017-06-11 NOTE — Progress Notes (Signed)
PT Cancellation Note  Patient Details Name: Carmen Ford MRN: [id number] DOB: September 29, 1946   Cancelled Treatment:    Reason Eval/Treat Not Completed: Other (comment) . Upon PT arrival, pt in tears and in pain due to "i fell back into the seat to fast and now my knee is killing me worse then after surgery." RN to give pt pain medicine and PT to return as able.  Erby Sanderson M Hedwig Mcfall 06/11/2017, 2:22 PM   Kittie Plater, PT, DPT Pager #: [phone] Office #: [phone]

## 2017-06-11 NOTE — Progress Notes (Signed)
   Subjective:  Patient reports pain as mild to moderate.  Denies N/V/CP/SOB. C/o foot and ankle pain - is a chronic issue.  Objective:   VITALS:   Vitals:   06/10/17 0852 06/10/17 1449 06/10/17 2047 06/11/17 0355  BP: (!) 134/53 (!) 137/55 (!) 132/50 (!) 144/51  Pulse: 71 74 70 76  Resp:  16 18 18   Temp:  98.6 F (37 C) 98.3 F (36.8 C) 98.3 F (36.8 C)  TempSrc:  Oral Oral Oral  SpO2: 93% 96% 97% 95%  Weight:      Height:        NAD ABD soft Sensation intact distally Intact pulses distally Dorsiflexion/Plantar flexion intact Incision: dressing C/D/I Compartment soft   Lab Results  Component Value Date   WBC 10.7 (H) 06/11/2017   HGB 9.1 (L) 06/11/2017   HCT 27.8 (L) 06/11/2017   MCV 85.0 06/11/2017   PLT 243 06/11/2017   BMET    Component Value Date/Time   NA 138 06/11/2017 0339   K 4.5 06/11/2017 0339   CL 104 06/11/2017 0339   CO2 25 06/11/2017 0339   GLUCOSE 116 (H) 06/11/2017 0339   BUN 26 (H) 06/11/2017 0339   CREATININE 1.35 (H) 06/11/2017 0339   CALCIUM 8.2 (L) 06/11/2017 0339   GFRNONAA 38 (L) 06/11/2017 0339   GFRAA 45 (L) 06/11/2017 0339     Assessment/Plan: 2 Days Post-Op   Principal Problem:   Osteoarthritis of left knee Active Problems:   Degenerative arthritis of left knee   WBAT with walker DVT ppx: ASA, SCDs, TEDs Po pain control PT/OT ABLA on chronic anemia: hgb 9.1 today, stable, cardiology wants to keep above 9 Dispo: d/c home today with HHPT   Conard Alvira, Horald Pollen 06/11/2017, 7:38 AM   Rod Can, MD Cell [phone]

## 2017-06-11 NOTE — Progress Notes (Signed)
Physical Therapy Treatment Patient Details Name: Carmen Ford MRN: [id number] DOB: Feb 10, 1946 Today's Date: 06/11/2017    History of Present Illness Pt admitted 8/6 for elective L TKA. PSH: h/o CABG PMH: CKD, bradycardia, heart failure, restless leg syndrome.    PT Comments    Pt pleasant and agreeable to mobilize with PT. Pt requires VCs for sequencing of gait (increased DF of Lt LE) and cues for NMR for exercises such as quad sets. Pt able to ambulate and navigate stairs without physical assistance and able to recall sequencing of stairs using RW without cues on second trial. Will continue to follow for increased mobilization, cues for decreasing gait deviations of affected LE, and strengthening for progressing towards PLOF and safe d/c.    Follow Up Recommendations  Home health PT;Supervision/Assistance - 24 hour     Equipment Recommendations  3in1 (PT)    Recommendations for Other Services       Precautions / Restrictions Precautions Precautions: Knee;Fall Precaution Comments: reviewed with Pt  Restrictions Weight Bearing Restrictions: Yes LLE Weight Bearing: Weight bearing as tolerated    Mobility  Bed Mobility Overal bed mobility: Independent Bed Mobility: Supine to Sit     Supine to sit: HOB elevated;Supervision     General bed mobility comments: supervision for safety  Transfers Overall transfer level: Needs assistance Equipment used: Rolling walker (2 wheeled) Transfers: Sit to/from Stand Sit to Stand: Min guard         General transfer comment: Min guard for safety. Pt did not require Vcs for hand placement today   Ambulation/Gait Ambulation/Gait assistance: Min guard Ambulation Distance (Feet): 150 Feet Assistive device: Rolling walker (2 wheeled) Gait Pattern/deviations: Step-through pattern;Decreased dorsiflexion - left;Trunk flexed;Decreased weight shift to left Gait velocity: slow Gait velocity interpretation: Below normal speed for  age/gender General Gait Details: Min guard for safety. VCs for DF of Lt foot    Stairs Stairs: Yes   Stair Management: No rails;With walker;Step to pattern;Backwards Number of Stairs: 4 General stair comments: Min guard for safety and VCs for sequencing with use of RW. 2 stairs x 2 trials. Pt able to recall sequencing on second trial without VCs  Wheelchair Mobility    Modified Rankin (Stroke Patients Only)       Balance Overall balance assessment: Needs assistance Sitting-balance support: Feet supported;No upper extremity supported Sitting balance-Leahy Scale: Good     Standing balance support: Bilateral upper extremity supported;During functional activity Standing balance-Leahy Scale: Fair Standing balance comment: Pt utilizing UE support on RW during mobility; stands at sink to wash hands with no LOB                             Cognition Arousal/Alertness: Awake/alert Behavior During Therapy: WFL for tasks assessed/performed Overall Cognitive Status: Within Functional Limits for tasks assessed                                        Exercises Total Joint Exercises Quad Sets: AROM;Left;15 reps;Seated Heel Slides: AROM;Left;Seated;15 reps Hip ABduction/ADduction: AROM;Seated;Left;15 reps Goniometric ROM: 7-70 AROM; measured seated in chair     General Comments        Pertinent Vitals/Pain Pain Assessment: Faces Pain Score: 9  Faces Pain Scale: Hurts little more Pain Location: Lt knee incision site  Pain Descriptors / Indicators: Sore Pain Intervention(s): Limited activity within patient's tolerance;Repositioned;Monitored during  session    Home Living                      Prior Function            PT Goals (current goals can now be found in the care plan section) Acute Rehab PT Goals Patient Stated Goal: home PT Goal Formulation: With patient Time For Goal Achievement: 06/25/17 Potential to Achieve Goals:  Good Progress towards PT goals: Progressing toward goals    Frequency    7X/week      PT Plan Current plan remains appropriate    Co-evaluation              AM-PAC PT "6 Clicks" Daily Activity  Outcome Measure  Difficulty turning over in bed (including adjusting bedclothes, sheets and blankets)?: Total Difficulty moving from lying on back to sitting on the side of the bed? : A Little Difficulty sitting down on and standing up from a chair with arms (e.g., wheelchair, bedside commode, etc,.)?: A Little Help needed moving to and from a bed to chair (including a wheelchair)?: A Little Help needed walking in hospital room?: A Little Help needed climbing 3-5 steps with a railing? : A Little 6 Click Score: 16    End of Session Equipment Utilized During Treatment: Gait belt Activity Tolerance: Patient tolerated treatment well Patient left: in chair;with call bell/phone within reach   PT Visit Diagnosis: Pain;Difficulty in walking, not elsewhere classified (R26.2);Other abnormalities of gait and mobility (R26.89);Muscle weakness (generalized) (M62.81) Pain - Right/Left: Left Pain - part of body: Knee     Time: 0827-0852 PT Time Calculation (min) (ACUTE ONLY): 25 min  Charges:  $Gait Training: 8-22 mins $Therapeutic Exercise: 8-22 mins                    G Codes:       Elberta Leatherwood, SPT Acute Rehab [place] 06/11/2017, 10:45 AM

## 2017-06-11 NOTE — Progress Notes (Signed)
Discharge instructions and RX reviewed with pt verb understanding. Pt left via wheelchair in NAD. Pt left with all belongings with husband at side.

## 2017-06-12 LAB — TYPE AND SCREEN
ABO/RH(D): A NEG
ANTIBODY SCREEN: NEGATIVE
UNIT DIVISION: 0
Unit division: 0

## 2017-06-12 LAB — BPAM RBC
BLOOD PRODUCT EXPIRATION DATE: 201808202359
Blood Product Expiration Date: 201808212359
ISSUE DATE / TIME: 201808061325
ISSUE DATE / TIME: 201808061325
Unit Type and Rh: 600
Unit Type and Rh: 600

## 2017-07-01 ENCOUNTER — Ambulatory Visit: Payer: Self-pay | Admitting: Orthopedic Surgery

## 2017-07-02 ENCOUNTER — Encounter (HOSPITAL_COMMUNITY): Payer: Self-pay | Admitting: *Deleted

## 2017-07-03 ENCOUNTER — Encounter (HOSPITAL_COMMUNITY)
Admission: RE | Disposition: A | Discharge status: Home / Self Care | Payer: Self-pay | Source: Ambulatory Visit | Attending: Orthopedic Surgery

## 2017-07-03 ENCOUNTER — Encounter (HOSPITAL_COMMUNITY): Payer: Self-pay | Admitting: *Deleted

## 2017-07-03 ENCOUNTER — Inpatient Hospital Stay (HOSPITAL_COMMUNITY): Payer: Medicare Other | Admitting: Certified Registered Nurse Anesthetist

## 2017-07-03 ENCOUNTER — Observation Stay (HOSPITAL_COMMUNITY)
Admission: RE | Admit: 2017-07-03 | Discharge: 2017-07-04 | Disposition: A | Discharge status: Home / Self Care | Payer: Medicare Other | Source: Ambulatory Visit | Attending: Orthopedic Surgery | Admitting: Orthopedic Surgery

## 2017-07-03 DIAGNOSIS — M159 Polyosteoarthritis, unspecified: Secondary | ICD-10-CM | POA: Diagnosis not present

## 2017-07-03 DIAGNOSIS — I259 Chronic ischemic heart disease, unspecified: Secondary | ICD-10-CM | POA: Insufficient documentation

## 2017-07-03 DIAGNOSIS — I5032 Chronic diastolic (congestive) heart failure: Secondary | ICD-10-CM | POA: Diagnosis not present

## 2017-07-03 DIAGNOSIS — Z6837 Body mass index (BMI) 37.0-37.9, adult: Secondary | ICD-10-CM | POA: Diagnosis not present

## 2017-07-03 DIAGNOSIS — S86812A Strain of other muscle(s) and tendon(s) at lower leg level, left leg, initial encounter: Secondary | ICD-10-CM | POA: Diagnosis present

## 2017-07-03 DIAGNOSIS — G2581 Restless legs syndrome: Secondary | ICD-10-CM | POA: Diagnosis not present

## 2017-07-03 DIAGNOSIS — Z8719 Personal history of other diseases of the digestive system: Secondary | ICD-10-CM | POA: Diagnosis not present

## 2017-07-03 DIAGNOSIS — S76112A Strain of left quadriceps muscle, fascia and tendon, initial encounter: Secondary | ICD-10-CM | POA: Diagnosis present

## 2017-07-03 DIAGNOSIS — E1122 Type 2 diabetes mellitus with diabetic chronic kidney disease: Secondary | ICD-10-CM | POA: Insufficient documentation

## 2017-07-03 DIAGNOSIS — Z794 Long term (current) use of insulin: Secondary | ICD-10-CM | POA: Diagnosis not present

## 2017-07-03 DIAGNOSIS — E785 Hyperlipidemia, unspecified: Secondary | ICD-10-CM | POA: Diagnosis not present

## 2017-07-03 DIAGNOSIS — Z880 Allergy status to penicillin: Secondary | ICD-10-CM | POA: Diagnosis not present

## 2017-07-03 DIAGNOSIS — I252 Old myocardial infarction: Secondary | ICD-10-CM | POA: Diagnosis not present

## 2017-07-03 DIAGNOSIS — X509XXA Other and unspecified overexertion or strenuous movements or postures, initial encounter: Secondary | ICD-10-CM | POA: Insufficient documentation

## 2017-07-03 DIAGNOSIS — N189 Chronic kidney disease, unspecified: Secondary | ICD-10-CM | POA: Diagnosis not present

## 2017-07-03 DIAGNOSIS — Z79899 Other long term (current) drug therapy: Secondary | ICD-10-CM | POA: Diagnosis not present

## 2017-07-03 DIAGNOSIS — Z7982 Long term (current) use of aspirin: Secondary | ICD-10-CM | POA: Insufficient documentation

## 2017-07-03 DIAGNOSIS — E669 Obesity, unspecified: Secondary | ICD-10-CM | POA: Diagnosis not present

## 2017-07-03 DIAGNOSIS — I130 Hypertensive heart and chronic kidney disease with heart failure and stage 1 through stage 4 chronic kidney disease, or unspecified chronic kidney disease: Secondary | ICD-10-CM | POA: Diagnosis not present

## 2017-07-03 DIAGNOSIS — Z951 Presence of aortocoronary bypass graft: Secondary | ICD-10-CM | POA: Diagnosis not present

## 2017-07-03 DIAGNOSIS — I2510 Atherosclerotic heart disease of native coronary artery without angina pectoris: Secondary | ICD-10-CM | POA: Insufficient documentation

## 2017-07-03 DIAGNOSIS — Z7901 Long term (current) use of anticoagulants: Secondary | ICD-10-CM | POA: Insufficient documentation

## 2017-07-03 DIAGNOSIS — I441 Atrioventricular block, second degree: Secondary | ICD-10-CM | POA: Diagnosis not present

## 2017-07-03 DIAGNOSIS — Z888 Allergy status to other drugs, medicaments and biological substances status: Secondary | ICD-10-CM | POA: Insufficient documentation

## 2017-07-03 DIAGNOSIS — E559 Vitamin D deficiency, unspecified: Secondary | ICD-10-CM | POA: Diagnosis not present

## 2017-07-03 DIAGNOSIS — Z96652 Presence of left artificial knee joint: Secondary | ICD-10-CM | POA: Insufficient documentation

## 2017-07-03 HISTORY — DX: Strain of other muscle(s) and tendon(s) at lower leg level, left leg, initial encounter: S86.812A

## 2017-07-03 HISTORY — PX: PATELLAR TENDON REPAIR: SHX737

## 2017-07-03 LAB — CBC
HCT: 28 % — ABNORMAL LOW (ref 36.0–46.0)
Hemoglobin: 8.9 g/dL — ABNORMAL LOW (ref 12.0–15.0)
MCH: 27.4 pg (ref 26.0–34.0)
MCHC: 31.8 g/dL (ref 30.0–36.0)
MCV: 86.2 fL (ref 78.0–100.0)
Platelets: 243 10*3/uL (ref 150–400)
RBC: 3.25 MIL/uL — ABNORMAL LOW (ref 3.87–5.11)
RDW: 14.7 % (ref 11.5–15.5)
WBC: 7.7 10*3/uL (ref 4.0–10.5)

## 2017-07-03 LAB — GLUCOSE, CAPILLARY
GLUCOSE-CAPILLARY: 114 mg/dL — AB (ref 65–99)
GLUCOSE-CAPILLARY: 103 mg/dL — AB (ref 65–99)
Glucose-Capillary: 113 mg/dL — ABNORMAL HIGH (ref 65–99)
Glucose-Capillary: 96 mg/dL (ref 65–99)

## 2017-07-03 LAB — BASIC METABOLIC PANEL
Anion gap: 9 (ref 5–15)
BUN: 37 mg/dL — ABNORMAL HIGH (ref 6–20)
CO2: 25 mmol/L (ref 22–32)
Calcium: 8.4 mg/dL — ABNORMAL LOW (ref 8.9–10.3)
Chloride: 102 mmol/L (ref 101–111)
Creatinine, Ser: 1.52 mg/dL — ABNORMAL HIGH (ref 0.44–1.00)
GFR calc Af Amer: 39 mL/min — ABNORMAL LOW (ref >60)
GFR calc non Af Amer: 33 mL/min — ABNORMAL LOW (ref >60)
Glucose, Bld: 110 mg/dL — ABNORMAL HIGH (ref 65–99)
Potassium: 4.5 mmol/L (ref 3.5–5.1)
Sodium: 136 mmol/L (ref 135–145)

## 2017-07-03 LAB — ABO/RH: ABO/RH(D): A NEG

## 2017-07-03 LAB — TYPE AND SCREEN
ABO/RH(D): A NEG
Antibody Screen: NEGATIVE

## 2017-07-03 SURGERY — REPAIR, TENDON, PATELLAR
Anesthesia: Spinal | Site: Knee | Laterality: Left

## 2017-07-03 MED ORDER — HYDROCODONE-ACETAMINOPHEN 5-325 MG PO TABS
1.0000 | ORAL_TABLET | ORAL | Status: DC | PRN
  Administered 2017-07-04: 1 via ORAL
  Administered 2017-07-04 (×2): 2 via ORAL
  Filled 2017-07-03: qty 2
  Filled 2017-07-03: qty 1
  Filled 2017-07-03: qty 2

## 2017-07-03 MED ORDER — MEPERIDINE HCL 50 MG/ML IJ SOLN: 6.2500 mg | INTRAMUSCULAR | Status: DC | PRN

## 2017-07-03 MED ORDER — ACETAMINOPHEN 325 MG PO TABS: 650.0000 mg | ORAL_TABLET | Freq: Four times a day (QID) | ORAL | Status: DC | PRN

## 2017-07-03 MED ORDER — DOCUSATE SODIUM 100 MG PO CAPS
100.0000 mg | ORAL_CAPSULE | Freq: Two times a day (BID) | ORAL | Status: DC
  Administered 2017-07-04 (×2): 100 mg via ORAL
  Filled 2017-07-03 (×2): qty 1

## 2017-07-03 MED ORDER — PROMETHAZINE HCL 25 MG/ML IJ SOLN: 6.2500 mg | INTRAMUSCULAR | Status: DC | PRN

## 2017-07-03 MED ORDER — PROPOFOL 10 MG/ML IV BOLUS
INTRAVENOUS | Status: DC | PRN
  Administered 2017-07-03: 170 mg via INTRAVENOUS

## 2017-07-03 MED ORDER — PRAVASTATIN SODIUM 20 MG PO TABS
20.0000 mg | ORAL_TABLET | Freq: Every day | ORAL | Status: DC
Start: 2017-07-04 — End: 2017-07-04
  Administered 2017-07-04: 20 mg via ORAL
  Filled 2017-07-03: qty 1

## 2017-07-03 MED ORDER — METOCLOPRAMIDE HCL 5 MG/ML IJ SOLN: 5.0000 mg | Freq: Three times a day (TID) | INTRAMUSCULAR | Status: DC | PRN

## 2017-07-03 MED ORDER — VITAMIN D 1000 UNITS PO TABS
1000.0000 [IU] | ORAL_TABLET | Freq: Every day | ORAL | Status: DC
  Administered 2017-07-04: 1000 [IU] via ORAL
  Filled 2017-07-03: qty 1

## 2017-07-03 MED ORDER — METOCLOPRAMIDE HCL 5 MG PO TABS: 5.0000 mg | ORAL_TABLET | Freq: Three times a day (TID) | ORAL | Status: DC | PRN

## 2017-07-03 MED ORDER — AMLODIPINE BESYLATE 10 MG PO TABS
10.0000 mg | ORAL_TABLET | Freq: Every day | ORAL | Status: DC
  Administered 2017-07-04: 09:00:00 10 mg via ORAL
  Filled 2017-07-03: qty 1

## 2017-07-03 MED ORDER — HYDROMORPHONE HCL-NACL 0.5-0.9 MG/ML-% IV SOSY: 0.2500 mg | PREFILLED_SYRINGE | INTRAVENOUS | Status: DC | PRN

## 2017-07-03 MED ORDER — LACTATED RINGERS IV SOLN
INTRAVENOUS | Status: DC
  Administered 2017-07-03 (×2): via INTRAVENOUS

## 2017-07-03 MED ORDER — SUCCINYLCHOLINE CHLORIDE 20 MG/ML IJ SOLN
INTRAMUSCULAR | Status: DC | PRN
  Administered 2017-07-03: 120 mg via INTRAVENOUS

## 2017-07-03 MED ORDER — FUROSEMIDE 20 MG PO TABS
40.0000 mg | ORAL_TABLET | ORAL | Status: DC
  Administered 2017-07-04: 40 mg via ORAL
  Filled 2017-07-03: qty 2

## 2017-07-03 MED ORDER — CLINDAMYCIN PHOSPHATE 900 MG/50ML IV SOLN
900.0000 mg | INTRAVENOUS | Status: AC
  Administered 2017-07-03: 900 mg via INTRAVENOUS
  Filled 2017-07-03: qty 50

## 2017-07-03 MED ORDER — ONDANSETRON HCL 4 MG PO TABS: 4.0000 mg | ORAL_TABLET | Freq: Four times a day (QID) | ORAL | Status: DC | PRN

## 2017-07-03 MED ORDER — FENTANYL CITRATE (PF) 100 MCG/2ML IJ SOLN
INTRAMUSCULAR | Status: AC
  Filled 2017-07-03: qty 2

## 2017-07-03 MED ORDER — HYDROMORPHONE HCL-NACL 0.5-0.9 MG/ML-% IV SOSY
0.5000 mg | PREFILLED_SYRINGE | INTRAVENOUS | Status: DC | PRN
  Administered 2017-07-03 – 2017-07-04 (×3): 1 mg via INTRAVENOUS
  Filled 2017-07-03 (×2): qty 2
  Filled 2017-07-03: qty 4

## 2017-07-03 MED ORDER — METHOCARBAMOL 500 MG PO TABS
500.0000 mg | ORAL_TABLET | Freq: Four times a day (QID) | ORAL | Status: DC | PRN
  Administered 2017-07-04: 500 mg via ORAL
  Filled 2017-07-03: qty 1

## 2017-07-03 MED ORDER — SUCCINYLCHOLINE CHLORIDE 200 MG/10ML IV SOSY
PREFILLED_SYRINGE | INTRAVENOUS | Status: AC
  Filled 2017-07-03: qty 10

## 2017-07-03 MED ORDER — METHOCARBAMOL 1000 MG/10ML IJ SOLN
500.0000 mg | Freq: Four times a day (QID) | INTRAVENOUS | Status: DC | PRN
  Filled 2017-07-03: qty 5

## 2017-07-03 MED ORDER — ONDANSETRON HCL 4 MG/2ML IJ SOLN
INTRAMUSCULAR | Status: DC | PRN
  Administered 2017-07-03: 4 mg via INTRAVENOUS

## 2017-07-03 MED ORDER — APIXABAN 2.5 MG PO TABS
2.5000 mg | ORAL_TABLET | Freq: Two times a day (BID) | ORAL | Status: DC
  Administered 2017-07-04: 10:00:00 2.5 mg via ORAL
  Filled 2017-07-03: qty 1

## 2017-07-03 MED ORDER — LIDOCAINE HCL (CARDIAC) 20 MG/ML IV SOLN
INTRAVENOUS | Status: DC | PRN
  Administered 2017-07-03: 60 mg via INTRATRACHEAL

## 2017-07-03 MED ORDER — ONDANSETRON HCL 4 MG/2ML IJ SOLN: 4.0000 mg | Freq: Four times a day (QID) | INTRAMUSCULAR | Status: DC | PRN

## 2017-07-03 MED ORDER — INSULIN GLARGINE 100 UNIT/ML ~~LOC~~ SOLN
15.0000 [IU] | Freq: Every day | SUBCUTANEOUS | Status: DC
  Administered 2017-07-04: 15 [IU] via SUBCUTANEOUS
  Filled 2017-07-03 (×2): qty 0.15

## 2017-07-03 MED ORDER — FENTANYL CITRATE (PF) 100 MCG/2ML IJ SOLN
INTRAMUSCULAR | Status: DC | PRN
  Administered 2017-07-03 (×2): 100 ug via INTRAVENOUS

## 2017-07-03 MED ORDER — ONDANSETRON HCL 4 MG/2ML IJ SOLN
INTRAMUSCULAR | Status: AC
  Filled 2017-07-03: qty 2

## 2017-07-03 MED ORDER — 0.9 % SODIUM CHLORIDE (POUR BTL) OPTIME
TOPICAL | Status: DC | PRN
  Administered 2017-07-03: 1000 mL

## 2017-07-03 MED ORDER — DIPHENHYDRAMINE HCL 12.5 MG/5ML PO ELIX: 12.5000 mg | ORAL_SOLUTION | ORAL | Status: DC | PRN

## 2017-07-03 MED ORDER — GLYCOPYRROLATE 0.2 MG/ML IJ SOLN
INTRAMUSCULAR | Status: DC | PRN
  Administered 2017-07-03: 0.4 mg via INTRAVENOUS

## 2017-07-03 MED ORDER — POLYETHYLENE GLYCOL 3350 17 G PO PACK: 17.0000 g | PACK | Freq: Every day | ORAL | Status: DC | PRN

## 2017-07-03 MED ORDER — INSULIN ASPART 100 UNIT/ML ~~LOC~~ SOLN: 0.0000 [IU] | Freq: Three times a day (TID) | SUBCUTANEOUS | Status: DC

## 2017-07-03 MED ORDER — CHLORHEXIDINE GLUCONATE 4 % EX LIQD: 60.0000 mL | Freq: Once | CUTANEOUS | Status: DC

## 2017-07-03 MED ORDER — FUROSEMIDE 20 MG PO TABS: 20.0000 mg | ORAL_TABLET | ORAL | Status: DC

## 2017-07-03 MED ORDER — ROPINIROLE HCL 1 MG PO TABS
2.0000 mg | ORAL_TABLET | Freq: Every day | ORAL | Status: DC
  Administered 2017-07-04: 2 mg via ORAL
  Filled 2017-07-03: qty 2

## 2017-07-03 MED ORDER — KETOROLAC TROMETHAMINE 30 MG/ML IJ SOLN: 30.0000 mg | Freq: Once | INTRAMUSCULAR | Status: DC | PRN

## 2017-07-03 MED ORDER — SODIUM CHLORIDE 0.9 % IR SOLN
Status: DC | PRN
  Administered 2017-07-03: 1000 mL

## 2017-07-03 MED ORDER — ACETAMINOPHEN 650 MG RE SUPP: 650.0000 mg | Freq: Four times a day (QID) | RECTAL | Status: DC | PRN

## 2017-07-03 MED ORDER — HYDROMORPHONE HCL 2 MG/ML IJ SOLN
INTRAMUSCULAR | Status: AC
  Filled 2017-07-03: qty 1

## 2017-07-03 MED ORDER — GABAPENTIN 400 MG PO CAPS
400.0000 mg | ORAL_CAPSULE | Freq: Three times a day (TID) | ORAL | Status: DC
  Administered 2017-07-04 (×2): 400 mg via ORAL
  Filled 2017-07-03 (×2): qty 1

## 2017-07-03 MED ORDER — LIDOCAINE 2% (20 MG/ML) 5 ML SYRINGE
INTRAMUSCULAR | Status: AC
  Filled 2017-07-03: qty 5

## 2017-07-03 MED ORDER — LOSARTAN POTASSIUM 25 MG PO TABS: 25.0000 mg | ORAL_TABLET | Freq: Every evening | ORAL | Status: DC

## 2017-07-03 MED ORDER — ISOPROPANOL 70 % SOLN
Status: DC | PRN
  Administered 2017-07-03: 30 mL via TOPICAL

## 2017-07-03 MED ORDER — CLINDAMYCIN PHOSPHATE 600 MG/50ML IV SOLN
600.0000 mg | Freq: Four times a day (QID) | INTRAVENOUS | Status: DC
  Administered 2017-07-04 (×2): 600 mg via INTRAVENOUS
  Filled 2017-07-03 (×3): qty 50

## 2017-07-03 MED ORDER — SENNA 8.6 MG PO TABS
1.0000 | ORAL_TABLET | Freq: Two times a day (BID) | ORAL | Status: DC
  Administered 2017-07-04 (×2): 8.6 mg via ORAL
  Filled 2017-07-03 (×2): qty 1

## 2017-07-03 MED ORDER — HYDROMORPHONE HCL 1 MG/ML IJ SOLN
INTRAMUSCULAR | Status: DC | PRN
  Administered 2017-07-03 (×2): 1 mg via INTRAVENOUS

## 2017-07-03 SURGICAL SUPPLY — 54 items
BANDAGE ACE 6X5 VEL STRL LF (GAUZE/BANDAGES/DRESSINGS) ×3 IMPLANT
BANDAGE ESMARK 6X9 LF (GAUZE/BANDAGES/DRESSINGS) ×1 IMPLANT
BIT DRILL 2.0X128 (BIT) ×2 IMPLANT
BIT DRILL 2.0X128MM (BIT) ×1
BIT DRILL 2.4X128 (BIT) IMPLANT
BIT DRILL 2.4X128MM (BIT)
BLADE 10 SAFETY STRL DISP (BLADE) ×3 IMPLANT
BNDG ESMARK 6X9 LF (GAUZE/BANDAGES/DRESSINGS) ×3
CHLORAPREP W/TINT 26ML (MISCELLANEOUS) ×3 IMPLANT
COVER SURGICAL LIGHT HANDLE (MISCELLANEOUS) ×3 IMPLANT
CUFF TOURN SGL QUICK 34 (TOURNIQUET CUFF) ×2
CUFF TRNQT CYL 34X4X40X1 (TOURNIQUET CUFF) ×1 IMPLANT
DERMABOND ADVANCED (GAUZE/BANDAGES/DRESSINGS)
DERMABOND ADVANCED .7 DNX12 (GAUZE/BANDAGES/DRESSINGS) IMPLANT
DRAPE EXTREMITY T 121X128X90 (DRAPE) ×3 IMPLANT
DRAPE U-SHAPE 47X51 STRL (DRAPES) ×3 IMPLANT
DRESSING AQUACEL AG SP 3.5X10 (GAUZE/BANDAGES/DRESSINGS) IMPLANT
DRSG AQUACEL AG SP 3.5X10 (GAUZE/BANDAGES/DRESSINGS)
FACESHIELD WRAPAROUND (MASK) ×3 IMPLANT
GAUZE SPONGE 4X4 12PLY STRL (GAUZE/BANDAGES/DRESSINGS) IMPLANT
GLOVE BIO SURGEON STRL SZ8.5 (GLOVE) ×6 IMPLANT
GLOVE BIOGEL M 7.0 STRL (GLOVE) IMPLANT
GLOVE BIOGEL PI IND STRL 7.5 (GLOVE) IMPLANT
GLOVE BIOGEL PI IND STRL 8.5 (GLOVE) ×1 IMPLANT
GLOVE BIOGEL PI INDICATOR 7.5 (GLOVE)
GLOVE BIOGEL PI INDICATOR 8.5 (GLOVE) ×2
GOWN SPEC L3 XXLG W/TWL (GOWN DISPOSABLE) ×3 IMPLANT
GOWN STRL REUS W/TWL XL LVL3 (GOWN DISPOSABLE) ×3 IMPLANT
HANDPIECE INTERPULSE COAX TIP (DISPOSABLE)
IMMOBILIZER KNEE 20 (SOFTGOODS) ×3
IMMOBILIZER KNEE 20 THIGH 36 (SOFTGOODS) ×1 IMPLANT
KIT BASIN OR (CUSTOM PROCEDURE TRAY) ×3 IMPLANT
KIT PREVENA INCISION MGT20CM45 (CANNISTER) ×3 IMPLANT
MANIFOLD NEPTUNE II (INSTRUMENTS) ×3 IMPLANT
PACK TOTAL JOINT WL LF (CUSTOM PROCEDURE TRAY) ×3 IMPLANT
PAD ABD 8X10 STRL (GAUZE/BANDAGES/DRESSINGS) IMPLANT
PADDING CAST COTTON 6X4 STRL (CAST SUPPLIES) IMPLANT
POSITIONER SURGICAL ARM (MISCELLANEOUS) ×3 IMPLANT
SET HNDPC FAN SPRY TIP SCT (DISPOSABLE) IMPLANT
SUT ETHILON 2 0 PSLX (SUTURE) IMPLANT
SUT FIBERWIRE #2 38 T-5 BLUE (SUTURE) ×6
SUT MON AB 2-0 CT1 36 (SUTURE) ×3 IMPLANT
SUT STRATAFIX PDO 1 14 VIOLET (SUTURE) ×2
SUT STRATFX PDO 1 14 VIOLET (SUTURE) ×1
SUT VIC AB 0 CT1 27 (SUTURE) ×6
SUT VIC AB 0 CT1 27XBRD ANTBC (SUTURE) ×3 IMPLANT
SUT VIC AB 1 CT1 36 (SUTURE) ×3 IMPLANT
SUT VIC AB 2-0 CT1 27 (SUTURE) ×2
SUT VIC AB 2-0 CT1 TAPERPNT 27 (SUTURE) ×1 IMPLANT
SUTURE FIBERWR #2 38 T-5 BLUE (SUTURE) ×2 IMPLANT
SUTURE STRATFX PDO 1 14 VIOLET (SUTURE) ×1 IMPLANT
TOWEL OR 17X26 10 PK STRL BLUE (TOWEL DISPOSABLE) IMPLANT
TOWEL OR NON WOVEN STRL DISP B (DISPOSABLE) IMPLANT
WRAP KNEE MAXI GEL POST OP (GAUZE/BANDAGES/DRESSINGS) ×3 IMPLANT

## 2017-07-03 NOTE — Anesthesia Preprocedure Evaluation (Addendum)
Anesthesia Evaluation  Patient identified by MRN, date of birth, ID band Patient awake    Reviewed: Allergy & Precautions, NPO status , Patient's Chart, lab work & pertinent test results  Airway Mallampati: III  TM Distance: >3 FB Neck ROM: Full    Dental  (+) Edentulous Upper, Edentulous Lower   Pulmonary neg pulmonary ROS,    Pulmonary exam normal breath sounds clear to auscultation       Cardiovascular hypertension, Pt. on medications + CAD, + Past MI, + CABG and +CHF  Normal cardiovascular exam Rhythm:Regular Rate:Normal  ECG: SR, 1st degree AV block, rate 65  Sees cardiologist [facility])   Echo 03/11/16 [facility] cardiology [place]): 1. Concentric LVH. Visual EF 55-60%. Doppler evidence of grade II diastolic dysfunction.  2. LA cavity moderately dilated. 3. Structurally normal mitral valve with trace regurgitation. 4. Structurally normal tricuspid valve with mild regurgitation   Neuro/Psych negative neurological ROS  negative psych ROS   GI/Hepatic negative GI ROS, Neg liver ROS,   Endo/Other  diabetes, Insulin Dependent  Renal/GU Renal InsufficiencyRenal disease     Musculoskeletal  (+) Arthritis , Osteoarthritis,    Abdominal (+) + obese,   Peds  Hematology  (+) anemia ,   Anesthesia Other Findings Hyperlipidemia RLS  Reproductive/Obstetrics                             Anesthesia Physical  Anesthesia Plan  ASA: III  Anesthesia Plan: General   Post-op Pain Management:    Induction: Intravenous  PONV Risk Score and Plan: 4 or greater and Ondansetron and Treatment may vary due to age or medical condition  Airway Management Planned: Oral ETT  Additional Equipment:   Intra-op Plan:   Post-operative Plan: Extubation in OR  Informed Consent: I have reviewed the patients History and Physical, chart, labs and discussed the procedure including the risks, benefits and  alternatives for the proposed anesthesia with the patient or authorized representative who has indicated his/her understanding and acceptance.   Dental advisory given  Plan Discussed with: CRNA and Surgeon  Anesthesia Plan Comments: (Plan switched to GA in OR when CSF no obtained)      Anesthesia Quick Evaluation

## 2017-07-03 NOTE — Transfer of Care (Signed)
Immediate Anesthesia Transfer of Care Note  Patient: Ralynn San  Procedure(s) Performed: Procedure(s): PATELLA TENDON REPAIR LEFT KNEE (Left)  Patient Location: PACU  Anesthesia Type:General  Level of Consciousness: awake, alert  and oriented  Airway & Oxygen Therapy: Patient Spontanous Breathing and Patient connected to face mask oxygen  Post-op Assessment: Report given to RN and Post -op Vital signs reviewed and stable  Post vital signs: Reviewed and stable  Last Vitals:  Vitals:   07/03/17 1155  BP: (!) 145/51  Pulse: 63  Resp: 20  Temp: 36.9 C  SpO2: 97%    Last Pain:  Vitals:   07/03/17 1258  TempSrc:   PainSc: 4       Patients Stated Pain Goal: 4 (46/96/29 5284)  Complications: No apparent anesthesia complications

## 2017-07-03 NOTE — Anesthesia Procedure Notes (Signed)
Procedure Name: Intubation Date/Time: 07/03/2017 5:59 PM Performed by: [place] Rohith Fauth C Pre-anesthesia Checklist: Patient identified, Emergency Drugs available, Suction available and Patient being monitored Patient Re-evaluated:Patient Re-evaluated prior to induction Oxygen Delivery Method: Circle system utilized Preoxygenation: Pre-oxygenation with 100% oxygen Induction Type: IV induction Ventilation: Mask ventilation without difficulty Tube type: Oral Number of attempts: 1 Airway Equipment and Method: Stylet and Oral airway Placement Confirmation: ETT inserted through vocal cords under direct vision,  positive ETCO2 and breath sounds checked- equal and bilateral Tube secured with: Tape Dental Injury: Teeth and Oropharynx as per pre-operative assessment

## 2017-07-03 NOTE — H&P (Signed)
PREOPERATIVE H&P  Chief Complaint: TEAR  HPI: Carmen Ford is a 71 y.o. female who presents for preoperative history and physical with a diagnosis of LEFT PATELLAR TENDON TEAR. She had an extensor lag.  She has elected for surgical management.   Past Medical History:  Diagnosis Date  . Anemia   . Anemia in chronic kidney disease 10/15/2016  . Bradycardia    Overview:  Not on a beta blocker with sinus bradycardia and Mobitz 1 second degree AV block  . Chronic diastolic heart failure ([place]) 02/26/2016   Overview:  Overview:  EF normal by echo 2013  . Chronic ischemic heart disease   . CKD (chronic kidney disease) 05/15/2017  . Colitis, acute   . Coronary artery disease    CABG  2011: LTA to LAD, SVG to M, SVG to PDA EF 40%  . Diabetes mellitus without complication ([place])    type II   . Essential (primary) hypertension 05/15/2017  . Fatigue 10/15/2016  . Hyperlipidemia 05/15/2017  . Hypertensive heart disease with heart failure ([place])   . Myocardial infarction ([place])   . 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 Restless legs syndrome 05/15/2017  . Vitamin D deficiency 05/15/2017   Past Surgical History:  Procedure Laterality Date  . ACHILLES TENDON SURGERY Left 2005   inserted a new tendon  . CARDIAC SURGERY    . CHOLECYSTECTOMY  02/2016  . COLONOSCOPY    . CORONARY ARTERY BYPASS GRAFT  2011   [facility]  . KNEE ARTHROPLASTY Left 06/09/2017   Procedure: LEFT TOTAL KNEE ARTHROPLASTY WITH COMPUTER NAVIGATION;  Surgeon: Rod Can, MD;  Location: [facility];  Service: Orthopedics;  Laterality: Left;  NEED RNFA  . VARICOSE VEIN SURGERY Bilateral 1990's   Social History   Social History  . Marital status: Married    Spouse name: N/A  . Number of children: N/A  . Years of education: N/A   Social History Main Topics  . Smoking status: Never Smoker  . Smokeless tobacco: Never Used  . Alcohol use No  . Drug use: No  . Sexual activity: Not Asked   Other Topics Concern  . None    Social History Narrative  . None   Family History  Problem Relation Age of Onset  . Diabetes Mother   . Hypertension Mother   . Congestive Heart Failure Father    Allergies  Allergen Reactions  . Penicillins Other (See Comments)    Tiredness Has patient had a PCN reaction causing immediate rash, facial/tongue/throat swelling, SOB or lightheadedness with hypotension:No Has patient had a PCN reaction causing severe rash involving mucus membranes or skin necrosis: No Has patient had a PCN reaction that required hospitalization: No Has patient had a PCN reaction occurring within the last 10 years: No If all of the above answers are "NO", then may proceed with Cephalosporin use.   . Statins Other (See Comments)    myalgias   Prior to Admission medications   Medication Sig Start Date End Date Taking? Authorizing Provider  amLODipine (NORVASC) 10 MG tablet Take 10 mg by mouth daily.  04/21/15  Yes [provider]  aspirin EC 81 MG tablet Take 81 mg by mouth daily.   Yes [provider]  Cholecalciferol (EQL VITAMIN D3) 2000 units CAPS Take 2,000 mg by mouth daily.   Yes [provider]  furosemide (LASIX) 20 MG tablet Take 20-40 mg by mouth every Monday, Wednesday, and Friday. Take 1 tablet (20 mg) on Tuesday, Thursday, Saturday,  and Sunday; then take 2 tablets (40 mg) on Monday, Wednesday, & Friday--in the morning. 05/06/15  Yes [provider]  gabapentin (NEURONTIN) 400 MG capsule Take 400 mg by mouth 3 (three) times daily.  03/23/15  Yes [provider]  HYDROcodone-acetaminophen (NORCO/VICODIN) 5-325 MG tablet Take 1-2 tablets by mouth every 4 (four) hours as needed (breakthrough pain). 06/10/17  Yes Mardell Suttles, Aaron Edelman, MD  insulin glargine (LANTUS) 100 UNIT/ML injection Inject 15-25 Units into the skin at bedtime. Take 25 units if blood glucose is over 100 & take 15 units if blood glucose is lower than 100.   Yes [provider]  insulin  lispro (HUMALOG) 100 UNIT/ML injection Inject 2-19 Units into the skin 3 (three) times daily with meals.    Yes [provider]  losartan (COZAAR) 25 MG tablet Take 25 mg by mouth every evening.  04/18/17  Yes [provider]  omega-3 acid ethyl esters (LOVAZA) 1 G capsule Take 2 g by mouth 2 (two) times daily.  04/21/15  Yes [provider]  pravastatin (PRAVACHOL) 40 MG tablet Take 20 mg by mouth daily.  04/21/15  Yes [provider]  rOPINIRole (REQUIP) 2 MG tablet Take 2 mg by mouth at bedtime.  04/21/15  Yes [provider]  docusate sodium (COLACE) 100 MG capsule Take 1 capsule (100 mg total) by mouth 2 (two) times daily. 06/10/17   Symone Cornman, Aaron Edelman, MD  ondansetron (ZOFRAN) 4 MG tablet Take 1 tablet (4 mg total) by mouth every 6 (six) hours as needed for nausea. Patient not taking: Reported on 07/02/2017 06/10/17   Rod Can, MD  senna (SENOKOT) 8.6 MG TABS tablet Take 2 tablets (17.2 mg total) by mouth at bedtime. Patient not taking: Reported on 07/02/2017 06/10/17   Rod Can, MD     Positive ROS: All other systems have been reviewed and were otherwise negative with the exception of those mentioned in the HPI and as above.  Physical Exam: General: Alert, no acute distress Cardiovascular: No pedal edema Respiratory: No cyanosis, no use of accessory musculature GI: No organomegaly, abdomen is soft and non-tender Skin: No lesions in the area of chief complaint Neurologic: Sensation intact distally Psychiatric: Patient is competent for consent with normal mood and affect Lymphatic: No axillary or cervical lymphadenopathy  MUSCULOSKELETAL: examination of the left knee reveals a well-healed incision.  She has a 14 extensor lag with straight leg raising.  Passively she achieves full extension.  She is neurovascularly intact.  Assessment: LEFT PATELLAR TENDON TEAR  Plan: Plan for Procedure(s): PATELLA TENDON REPAIR LEFT KNEE  The risks  benefits and alternatives were discussed with the patient including but not limited to the risks of nonoperative treatment, versus surgical intervention including infection, bleeding, nerve injury, blood clots, cardiopulmonary complications, morbidity, mortality, among others, and they were willing to proceed.   Lucelia Lacey, Horald Pollen, MD Cell [phone]   07/03/2017 5:30 PM

## 2017-07-03 NOTE — Discharge Instructions (Addendum)
Keep the left knee in full extension at all times.  Do not bend your left knee under any circumstance. Wear knee immobilizer at all times. Weight-bearing as tolerated in the immobilizer. Keep VAC dressing clean and dry.  Do not remove. Charge VAC unit nightly.  Information on my medicine - ELIQUIS (apixaban)  Why was Eliquis prescribed for you? Eliquis was prescribed for you to reduce the risk of blood clots forming after orthopedic surgery.    What do You need to know about Eliquis? Take your Eliquis TWICE DAILY - one tablet in the morning and one tablet in the evening with or without food.  It would be best to take the dose about the same time each day.  If you have difficulty swallowing the tablet whole please discuss with your pharmacist how to take the medication safely.  Take Eliquis exactly as prescribed by your doctor and DO NOT stop taking Eliquis without talking to the doctor who prescribed the medication.  Stopping without other medication to take the place of Eliquis may increase your risk of developing a clot.  After discharge, you should have regular check-up appointments with your healthcare provider that is prescribing your Eliquis.  What do you do if you miss a dose? If a dose of ELIQUIS is not taken at the scheduled time, take it as soon as possible on the same day and twice-daily administration should be resumed.  The dose should not be doubled to make up for a missed dose.  Do not take more than one tablet of ELIQUIS at the same time.  Important Safety Information A possible side effect of Eliquis is bleeding. You should call your healthcare provider right away if you experience any of the following: ? Bleeding from an injury or your nose that does not stop. ? Unusual colored urine (red or dark brown) or unusual colored stools (red or black). ? Unusual bruising for unknown reasons. ? A serious fall or if you hit your head (even if there is no  bleeding).  Some medicines may interact with Eliquis and might increase your risk of bleeding or clotting while on Eliquis. To help avoid this, consult your healthcare provider or pharmacist prior to using any new prescription or non-prescription medications, including herbals, vitamins, non-steroidal anti-inflammatory drugs (NSAIDs) and supplements.  This website has more information on Eliquis (apixaban): http://www.eliquis.com/eliquis/home

## 2017-07-03 NOTE — Anesthesia Postprocedure Evaluation (Signed)
Anesthesia Post Note  Patient: Carmen Ford  Procedure(s) Performed: Procedure(s) (LRB): PATELLA TENDON REPAIR LEFT KNEE (Left)     Patient location during evaluation: PACU Anesthesia Type: General Level of consciousness: awake and sedated Pain management: pain level controlled Vital Signs Assessment: post-procedure vital signs reviewed and stable Respiratory status: spontaneous breathing Cardiovascular status: stable Postop Assessment: no signs of nausea or vomiting Anesthetic complications: no    Last Vitals:  Vitals:   07/03/17 2000 07/03/17 2015  BP: (!) 152/69 (!) 124/59  Pulse: 84 84  Resp: 12 16  Temp:    SpO2: 100% 98%    Last Pain:  Vitals:   07/03/17 2015  TempSrc:   PainSc: 0-No pain   Pain Goal: Patients Stated Pain Goal: 4 (07/03/17 2015)               Zackariah Vanderpol JR,JOHN Mateo Flow

## 2017-07-03 NOTE — Brief Op Note (Signed)
07/03/2017  7:50 PM  PATIENT:  Carmen Ford  71 y.o. female  PRE-OPERATIVE DIAGNOSIS:  Left patellar tendon TEAR  POST-OPERATIVE DIAGNOSIS:  Left patellar tendon TEAR  PROCEDURE:  Procedure(s): PATELLA TENDON REPAIR LEFT KNEE (Left)  SURGEON:  Surgeon(s) and Role:    * Louetta Hollingshead, Aaron Edelman, MD - Primary  PHYSICIAN ASSISTANT: none  ASSISTANTS: staff   ANESTHESIA:   general  EBL:  Total I/O In: 700 [I.V.:700] Out: 25 [Blood:25]  BLOOD ADMINISTERED:none  DRAINS: Prevena VAC  LOCAL MEDICATIONS USED:  NONE  SPECIMEN:  No Specimen  DISPOSITION OF SPECIMEN:  N/A  COUNTS:  YES  TOURNIQUET:   Total Tourniquet Time Documented: Thigh (Left) - 60 minutes Total: Thigh (Left) - 60 minutes   DICTATION: .Other Dictation: Dictation Number [id number]  PLAN OF CARE: Admit for overnight observation  PATIENT DISPOSITION:  PACU - hemodynamically stable.   Delay start of Pharmacological VTE agent (>24hrs) due to surgical blood loss or risk of bleeding: no

## 2017-07-04 ENCOUNTER — Other Ambulatory Visit: Payer: Self-pay | Admitting: Orthopedic Surgery

## 2017-07-04 ENCOUNTER — Encounter (HOSPITAL_COMMUNITY): Payer: Self-pay | Admitting: Orthopedic Surgery

## 2017-07-04 DIAGNOSIS — S76112A Strain of left quadriceps muscle, fascia and tendon, initial encounter: Secondary | ICD-10-CM | POA: Diagnosis not present

## 2017-07-04 LAB — GLUCOSE, CAPILLARY: Glucose-Capillary: 101 mg/dL — ABNORMAL HIGH (ref 65–99)

## 2017-07-04 MED ORDER — APIXABAN 2.5 MG PO TABS: 2.5000 mg | ORAL_TABLET | Freq: Two times a day (BID) | ORAL | 0 refills | Status: DC

## 2017-07-04 MED ORDER — HYDROCODONE-ACETAMINOPHEN 5-325 MG PO TABS: 1.0000 | ORAL_TABLET | ORAL | 0 refills | Status: DC | PRN

## 2017-07-04 MED FILL — Isopropyl Alcohol 70%: Qty: 480 | Status: AC

## 2017-07-04 NOTE — Progress Notes (Signed)
Patient d/c prior to being seen, [place] orders not entered but d/c summary indicated [place] needed. Contacted Leonides Grills at office, she states will take care of making arrangements. [phone]

## 2017-07-04 NOTE — Evaluation (Signed)
Physical Therapy Evaluation Patient Details Name: Michelina Mexicano MRN: [id number] DOB: 29-Apr-1946 Today's Date: 07/04/2017   History of Present Illness  Pt s/p L patellar tendon repair and with hx of L TKR 06/09/17.  Clinical Impression  Pt admitted as above and presenting with functional mobility limitations 2* post op pain and no L LE in [place].  Pt should progress to dc home with family assist.  Eval ltd 2* to pt need for [facility] and arrival of physician to change wound vac.    Follow Up Recommendations No PT follow up    Equipment Recommendations  None recommended by PT    Recommendations for Other Services       Precautions / Restrictions Precautions Precautions: Fall;Other (comment) Precaution Comments: No ROM at L knee Required Braces or Orthoses: Knee Immobilizer - Right Knee Immobilizer - Right: On at all times Restrictions Weight Bearing Restrictions: No Other Position/Activity Restrictions: WBAT      Mobility  Bed Mobility Overal bed mobility: Needs Assistance Bed Mobility: Supine to Sit     Supine to sit: Min assist     General bed mobility comments: cues for sequence and min assist to manage L LE  Transfers Overall transfer level: Needs assistance Equipment used: Rolling walker (2 wheeled) Transfers: Sit to/from [facility] Sit to Stand: Min assist Stand pivot transfers: Min guard       General transfer comment: cues for LE management and use of UEs to self assist.  Stand/pvt with RW bed to [facility].  Pt tolerating min WB on L LE  Ambulation/Gait                Stairs            Wheelchair Mobility    Modified Rankin (Stroke Patients Only)       Balance Overall balance assessment: Needs assistance Sitting-balance support: No upper extremity supported;Feet supported Sitting balance-Leahy Scale: Good     Standing balance support: Bilateral upper extremity supported Standing balance-Leahy Scale: Fair                                Pertinent Vitals/Pain Pain Assessment: 0-10 Pain Score: 5  Pain Location: L knee Pain Descriptors / Indicators: Aching;Sore Pain Intervention(s): Limited activity within patient's tolerance;Monitored during session;Premedicated before session;Ice applied    Home Living Family/patient expects to be discharged to:: Private residence Living Arrangements: Spouse/significant other Available Help at Discharge: Family Type of Home: House Home Access: Stairs to enter Entrance Stairs-Rails: None Entrance Stairs-Number of Steps: 1 Home Layout: One level Home Equipment: Environmental consultant - 4 wheels;Cane - single point;Bedside commode;Shower seat      Prior Function Level of Independence: Independent with assistive device(s)         Comments: using RW for mobility      Hand Dominance   Dominant Hand: Right    Extremity/Trunk Assessment   Upper Extremity Assessment Upper Extremity Assessment: Overall WFL for tasks assessed    Lower Extremity Assessment Lower Extremity Assessment: LLE deficits/detail LLE Deficits / Details: KI in place, NO ROM at knee    Cervical / Trunk Assessment Cervical / Trunk Assessment: Normal  Communication   Communication: No difficulties  Cognition Arousal/Alertness: Awake/alert Behavior During Therapy: WFL for tasks assessed/performed Overall Cognitive Status: Within Functional Limits for tasks assessed  General Comments      Exercises Total Joint Exercises Ankle Circles/Pumps: AROM;Both;15 reps;Supine   Assessment/Plan    PT Assessment Patient needs continued PT services  PT Problem List Decreased strength;Decreased range of motion;Decreased activity tolerance;Decreased balance;Pain;Decreased mobility;Decreased knowledge of use of DME;Obesity       PT Treatment Interventions DME instruction;Gait training;Stair training;Functional mobility training;Therapeutic  activities;Therapeutic exercise;Patient/family education    PT Goals (Current goals can be found in the Care Plan section)  Acute Rehab PT Goals Patient Stated Goal: Regain IND PT Goal Formulation: With patient Time For Goal Achievement: 07/06/17 Potential to Achieve Goals: Good    Frequency 7X/week   Barriers to discharge        Co-evaluation               AM-PAC PT "6 Clicks" Daily Activity  Outcome Measure Difficulty turning over in bed (including adjusting bedclothes, sheets and blankets)?: A Lot Difficulty moving from lying on back to sitting on the side of the bed? : A Lot Difficulty sitting down on and standing up from a chair with arms (e.g., wheelchair, bedside commode, etc,.)?: A Lot Help needed moving to and from a bed to chair (including a wheelchair)?: A Little Help needed walking in hospital room?: A Little Help needed climbing 3-5 steps with a railing? : A Lot 6 Click Score: 14    End of Session Equipment Utilized During Treatment: Gait belt;Left knee immobilizer Activity Tolerance: Patient limited by pain Patient left: Other (comment) (BSC) Nurse Communication: Mobility status PT Visit Diagnosis: Unsteadiness on feet (R26.81);Difficulty in walking, not elsewhere classified (R26.2)    Time: 4496-7591 PT Time Calculation (min) (ACUTE ONLY): 16 min   Charges:   PT Evaluation $PT Eval Low Complexity: 1 Low     PT G Codes:   PT G-Codes **NOT FOR INPATIENT CLASS** Functional Assessment Tool Used: AM-PAC 6 Clicks Basic Mobility Functional Limitation: Mobility: Walking and moving around Mobility: Walking and Moving Around Current Status (M3846): At least 40 percent but less than 60 percent impaired, limited or restricted Mobility: Walking and Moving Around Goal Status [phone]): At least 1 percent but less than 20 percent impaired, limited or restricted    Pg [phone]   Nateisha Moyd 07/04/2017, 1:13 PM

## 2017-07-04 NOTE — Progress Notes (Signed)
Physical Therapy Treatment Patient Details Name: Carmen Ford MRN: [id number] DOB: [birth year] Today's Date: 07/04/2017    History of Present Illness Pt s/p L patellar tendon repair and with hx of L TKR 06/09/17.    PT Comments    Pt progressing with ambulation including up/down step.  Pt and dtr reviewed on proper KI fit   Follow Up Recommendations  No PT follow up     Equipment Recommendations  None recommended by PT    Recommendations for Other Services       Precautions / Restrictions Precautions Precautions: Fall;Other (comment) Precaution Comments: No ROM at L knee Required Braces or Orthoses: Knee Immobilizer - Right Knee Immobilizer - Right: On at all times Restrictions Weight Bearing Restrictions: No Other Position/Activity Restrictions: WBAT    Mobility  Bed Mobility Overal bed mobility: Needs Assistance Bed Mobility: Supine to Sit     Supine to sit: Min assist     General bed mobility comments: NT - pt up on BSC  Transfers Overall transfer level: Needs assistance Equipment used: Rolling walker (2 wheeled) Transfers: Sit to/from Stand Sit to Stand: Min assist;Min guard Stand pivot transfers: Min guard       General transfer comment: cues for LE management and use of UEs to self assist.  Stand/pvt with RW bed to [facility].  Pt tolerating min WB on L LE  Ambulation/Gait Ambulation/Gait assistance: Min assist;Min guard Ambulation Distance (Feet): 40 Feet Assistive device: Rolling walker (2 wheeled) Gait Pattern/deviations: Step-to pattern;Decreased step length - right;Decreased step length - left;Shuffle;Trunk flexed Gait velocity: decr Gait velocity interpretation: Below normal speed for age/gender General Gait Details: Standing with RW, pt encouraged to and able to tolerate increased WB on L LE.  Pt ambulated with cues for sequence, posture and position from RW   Stairs Stairs: Yes   Stair Management: No rails;Step to pattern;Backwards;With  walker Number of Stairs: 1 General stair comments: cues for sequence and foot/RW placement  Wheelchair Mobility    Modified Rankin (Stroke Patients Only)       Balance Overall balance assessment: Needs assistance Sitting-balance support: No upper extremity supported;Feet supported Sitting balance-Leahy Scale: Good     Standing balance support: Bilateral upper extremity supported Standing balance-Leahy Scale: Fair                              Cognition Arousal/Alertness: Awake/alert Behavior During Therapy: WFL for tasks assessed/performed Overall Cognitive Status: Within Functional Limits for tasks assessed                                        Exercises Total Joint Exercises Ankle Circles/Pumps: AROM;Both;15 reps;Supine    General Comments        Pertinent Vitals/Pain Pain Assessment: 0-10 Pain Score: 5  Pain Location: L knee Pain Descriptors / Indicators: Aching;Sore Pain Intervention(s): Limited activity within patient's tolerance;Monitored during session;Premedicated before session;Ice applied    Home Living Family/patient expects to be discharged to:: Private residence Living Arrangements: Spouse/significant other Available Help at Discharge: Family Type of Home: House Home Access: Stairs to enter Entrance Stairs-Rails: None Home Layout: One level Home Equipment: Environmental consultant - 4 wheels;Cane - single point;Bedside commode;Shower seat      Prior Function Level of Independence: Independent with assistive device(s)      Comments: using RW for mobility    PT Goals (current  goals can now be found in the care plan section) Acute Rehab PT Goals Patient Stated Goal: Regain IND PT Goal Formulation: With patient Time For Goal Achievement: 07/06/17 Potential to Achieve Goals: Good Progress towards PT goals: Progressing toward goals    Frequency    7X/week      PT Plan Current plan remains appropriate    Co-evaluation               AM-PAC PT "6 Clicks" Daily Activity  Outcome Measure  Difficulty turning over in bed (including adjusting bedclothes, sheets and blankets)?: A Lot Difficulty moving from lying on back to sitting on the side of the bed? : A Lot Difficulty sitting down on and standing up from a chair with arms (e.g., wheelchair, bedside commode, etc,.)?: A Lot Help needed moving to and from a bed to chair (including a wheelchair)?: A Little Help needed walking in hospital room?: A Little Help needed climbing 3-5 steps with a railing? : A Lot 6 Click Score: 14    End of Session Equipment Utilized During Treatment: Gait belt;Left knee immobilizer Activity Tolerance: Patient limited by pain Patient left: Other (comment) Nurse Communication: Mobility status PT Visit Diagnosis: Unsteadiness on feet (R26.81);Difficulty in walking, not elsewhere classified (R26.2)     Time: 0981-1914 PT Time Calculation (min) (ACUTE ONLY): 20 min  Charges:  $Gait Training: 8-22 mins                    G Codes:  Functional Assessment Tool Used: AM-PAC 6 Clicks Basic Mobility Functional Limitation: Mobility: Walking and moving around Mobility: Walking and Moving Around Current Status (N8295): At least 40 percent but less than 60 percent impaired, limited or restricted Mobility: Walking and Moving Around Goal Status [phone]): At least 1 percent but less than 20 percent impaired, limited or restricted    Pg [phone]    Antonetta Clanton 07/04/2017, 1:25 PM

## 2017-07-04 NOTE — Discharge Summary (Signed)
Physician Discharge Summary  Patient ID: Carmen Ford MRN: [id number] DOB/AGE: 05/06/1946 71 y.o.  Admit date: 07/03/2017 Discharge date: 07/04/2017  Admission Diagnoses:  Rupture of left patellar tendon  Discharge Diagnoses:  Principal Problem:   Rupture of left patellar tendon   Past Medical History:  Diagnosis Date  . Anemia   . Anemia in chronic kidney disease 10/15/2016  . Bradycardia    Overview:  Not on a beta blocker with sinus bradycardia and Mobitz 1 second degree AV block  . Chronic diastolic heart failure ([place]) 02/26/2016   Overview:  Overview:  EF normal by echo 2013  . Chronic ischemic heart disease   . CKD (chronic kidney disease) 05/15/2017  . Colitis, acute   . Coronary artery disease    CABG  2011: LTA to LAD, SVG to M, SVG to PDA EF 40%  . Diabetes mellitus without complication ([place])    type II   . Essential (primary) hypertension 05/15/2017  . Fatigue 10/15/2016  . Hyperlipidemia 05/15/2017  . Hypertensive heart disease with heart failure ([place])   . Myocardial infarction ([place])   . 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 Restless legs syndrome 05/15/2017  . Vitamin D deficiency 05/15/2017    Surgeries: Procedure(s): PATELLA TENDON REPAIR LEFT KNEE on 07/03/2017   Consultants (if any):   Discharged Condition: Improved  Hospital Course: Carmen Ford is an 71 y.o. female who was admitted 07/03/2017 with a diagnosis of Rupture of left patellar tendon and went to the operating room on 07/03/2017 and underwent the above named procedures.    She was given perioperative antibiotics:  Anti-infectives    Start     Dose/Rate Route Frequency Ordered Stop   07/04/17 0000  clindamycin (CLEOCIN) IVPB 600 mg  Status:  Discontinued     600 mg 100 mL/hr over 30 Minutes Intravenous Every 6 hours 07/03/17 2147 07/04/17 1609   07/03/17 1215  clindamycin (CLEOCIN) IVPB 900 mg     900 mg 100 mL/hr over 30 Minutes Intravenous On call to O.R. 07/03/17 1206 07/03/17 1820    .  She  was given sequential compression devices, early ambulation, and apixaban for DVT prophylaxis.  Knee immobilizer at all times. Prevena VAC intact.  She benefited maximally from the hospital stay and there were no complications.    Recent vital signs:  Vitals:   07/04/17 0642 07/04/17 0942  BP: 115/86 (!) 150/61  Pulse: 67 71  Resp: 15 16  Temp: 97.9 F (36.6 C) 98.4 F (36.9 C)  SpO2: 98% 100%    Recent laboratory studies:  Lab Results  Component Value Date   HGB 8.9 (L) 07/03/2017   HGB 9.1 (L) 06/11/2017   HGB 9.0 (L) 06/10/2017   Lab Results  Component Value Date   WBC 7.7 07/03/2017   PLT 243 07/03/2017   No results found for: INR Lab Results  Component Value Date   NA 136 07/03/2017   K 4.5 07/03/2017   CL 102 07/03/2017   CO2 25 07/03/2017   BUN 37 (H) 07/03/2017   CREATININE 1.52 (H) 07/03/2017   GLUCOSE 110 (H) 07/03/2017    Discharge Medications:   Allergies as of 07/04/2017      Reactions   Penicillins Other (See Comments)   Tiredness Has patient had a PCN reaction causing immediate rash, facial/tongue/throat swelling, SOB or lightheadedness with hypotension:No Has patient had a PCN reaction causing severe rash involving mucus membranes or skin necrosis: No Has patient had a PCN reaction that required hospitalization: No  Has patient had a PCN reaction occurring within the last 10 years: No If all of the above answers are "NO", then may proceed with Cephalosporin use.   Statins Other (See Comments)   myalgias      Medication List    STOP taking these medications   aspirin EC 81 MG tablet     TAKE these medications   amLODipine 10 MG tablet Commonly known as:  NORVASC Take 10 mg by mouth daily.   apixaban 2.5 MG Tabs tablet Commonly known as:  ELIQUIS Take 1 tablet (2.5 mg total) by mouth 2 (two) times daily.   docusate sodium 100 MG capsule Commonly known as:  COLACE Take 1 capsule (100 mg total) by mouth 2 (two) times daily.   EQL  VITAMIN D3 2000 units Caps Generic drug:  Cholecalciferol Take 2,000 mg by mouth daily.   furosemide 20 MG tablet Commonly known as:  LASIX Take 20-40 mg by mouth every Monday, Wednesday, and Friday. Take 1 tablet (20 mg) on Tuesday, Thursday, Saturday, and "Sunday; then take 2 tablets (40 mg) on Monday, Wednesday, & Friday--in the morning.   gabapentin 400 MG capsule Commonly known as:  NEURONTIN Take 400 mg by mouth 3 (three) times daily.   HYDROcodone-acetaminophen 5-325 MG tablet Commonly known as:  NORCO/VICODIN Take 1-2 tablets by mouth every 4 (four) hours as needed (breakthrough pain).   insulin glargine 100 UNIT/ML injection Commonly known as:  LANTUS Inject 15-25 Units into the skin at bedtime. Take 25 units if blood glucose is over 100 & take 15 units if blood glucose is lower than 100.   insulin lispro 100 UNIT/ML injection Commonly known as:  HUMALOG Inject 2-19 Units into the skin 3 (three) times daily with meals.   losartan 25 MG tablet Commonly known as:  COZAAR Take 25 mg by mouth every evening.   omega-3 acid ethyl esters 1 g capsule Commonly known as:  LOVAZA Take 2 g by mouth 2 (two) times daily.   ondansetron 4 MG tablet Commonly known as:  ZOFRAN Take 1 tablet (4 mg total) by mouth every 6 (six) hours as needed for nausea.   pravastatin 40 MG tablet Commonly known as:  PRAVACHOL Take 20 mg by mouth daily.   rOPINIRole 2 MG tablet Commonly known as:  REQUIP Take 2 mg by mouth at bedtime.   senna 8.6 MG Tabs tablet Commonly known as:  SENOKOT Take 2 tablets (17.2 mg total) by mouth at bedtime.            Discharge Care Instructions        Start     Ordered   07/04/17 0000  apixaban (ELIQUIS) 2.5 MG TABS tablet  2 times daily     07/04/17 1109   07/04/17 0000  HYDROcodone-acetaminophen (NORCO/VICODIN) 5-325 MG tablet  Every 4 hours PRN     07/04/17 1109   07/04/17 0000  Call MD / Call 911    Comments:  If you experience chest pain or  shortness of breath, CALL 911 and be transported to the hospital emergency room.  If you develope a fever above 101 F, pus (white drainage) or increased drainage or redness at the wound, or calf pain, call your surgeon's office.   07/04/17 1109   07/04/17 0000  Diet - low sodium heart healthy     08" /31/18 1109   07/04/17 0000  Constipation Prevention    Comments:  Drink plenty of fluids.  Prune juice may be helpful.  You may use a stool softener, such as Colace (over the counter) 100 mg twice a day.  Use MiraLax (over the counter) for constipation as needed.   07/04/17 1109   07/04/17 0000  Increase activity slowly as tolerated     07/04/17 1109   07/04/17 0000  Discharge instructions    Comments:  Maintain knee in full extension at all times Knee immobilizer at all times Weight bearing as tolerated with knee immobilizer Keep VAC dressing clean and dry Charge VAC unit nightly   07/04/17 1109   07/04/17 0000  Driving restrictions    Comments:  No driving for 6 weeks   07/04/17 1109   07/04/17 0000  Lifting restrictions    Comments:  No lifting for 6 weeks   07/04/17 1109      Diagnostic Studies: Dg Knee Left Port  Result Date: 06/09/2017 CLINICAL DATA:  Left total knee arthroplasty. EXAM: PORTABLE LEFT KNEE - 1-2 VIEW COMPARISON:  None. FINDINGS: Two views study shows the patient be status post tricompartmental knee replacement. No evidence for immediate hardware complications. Gas in the joint space and overlying soft tissues is compatible with the recent surgery. Surgical drain noted anteriorly. IMPRESSION: Status post tricompartmental knee replacement without evidence for immediate hardware complications. Electronically Signed   By: Misty Stanley M.D.   On: 06/09/2017 16:02    Disposition: 01-Home or Self Care  Discharge Instructions    Call MD / Call 911    Complete by:  As directed    If you experience chest pain or shortness of breath, CALL 911 and be transported to the  hospital emergency room.  If you develope a fever above 101 F, pus (white drainage) or increased drainage or redness at the wound, or calf pain, call your surgeon's office.   Constipation Prevention    Complete by:  As directed    Drink plenty of fluids.  Prune juice may be helpful.  You may use a stool softener, such as Colace (over the counter) 100 mg twice a day.  Use MiraLax (over the counter) for constipation as needed.   Diet - low sodium heart healthy    Complete by:  As directed    Discharge instructions    Complete by:  As directed    Maintain knee in full extension at all times Knee immobilizer at all times Weight bearing as tolerated with knee immobilizer Keep VAC dressing clean and dry Charge VAC unit nightly   Driving restrictions    Complete by:  As directed    No driving for 6 weeks   Increase activity slowly as tolerated    Complete by:  As directed    Lifting restrictions    Complete by:  As directed    No lifting for 6 weeks      Follow-up Information    Shila Kruczek, Aaron Edelman, MD. Schedule an appointment as soon as possible for a visit on 07/11/2017.   Specialty:  Orthopedic Surgery Why:  for [facility] removal Contact information: [address] [address] [phone]            Signed: Elie Goody 07/05/2017, 8:52 AM

## 2017-07-04 NOTE — Progress Notes (Signed)
   Subjective:  Patient reports pain as mild to moderate.  Denies N/V/CP/SOB.  Objective:   VITALS:   Vitals:   07/04/17 0028 07/04/17 0443 07/04/17 0642 07/04/17 0942  BP: (!) 121/53 (!) 115/42 115/86 (!) 150/61  Pulse: 62 62 67 71  Resp: 14 14 15 16   Temp: 98 F (36.7 C) 98 F (36.7 C) 97.9 F (36.6 C) 98.4 F (36.9 C)  TempSrc: Oral Oral Oral Oral  SpO2: 95% 100% 98% 100%  Weight:      Height:        NAD ABD soft Sensation intact distally Intact pulses distally Dorsiflexion/Plantar flexion intact Incision: dressing C/D/I Compartment soft VAC intact   Lab Results  Component Value Date   WBC 7.7 07/03/2017   HGB 8.9 (L) 07/03/2017   HCT 28.0 (L) 07/03/2017   MCV 86.2 07/03/2017   PLT 243 07/03/2017   BMET    Component Value Date/Time   NA 136 07/03/2017 1220   K 4.5 07/03/2017 1220   CL 102 07/03/2017 1220   CO2 25 07/03/2017 1220   GLUCOSE 110 (H) 07/03/2017 1220   BUN 37 (H) 07/03/2017 1220   CREATININE 1.52 (H) 07/03/2017 1220   CALCIUM 8.4 (L) 07/03/2017 1220   GFRNONAA 33 (L) 07/03/2017 1220   GFRAA 39 (L) 07/03/2017 1220     Assessment/Plan: 1 Day Post-Op   Active Problems:   Rupture of left patellar tendon   WBAT with walker Knee immobilizer at all times Maintain knee in full extension at all times PT/OT ASA, SCDs, TEDs PO pain control D/C home today with HHPT   Charlesia Canaday, Horald Pollen 07/04/2017, 11:04 AM   Rod Can, MD Cell [phone]

## 2017-07-04 NOTE — Op Note (Signed)
NAME:  Carmen Ford, Carmen Ford NO.:  MEDICAL RECORD NO.:  [id number]  LOCATION:                                 FACILITY:  PHYSICIAN:  Rod Can, MD     DATE OF BIRTH:  May 29, 1946  DATE OF PROCEDURE:  07/03/2017 DATE OF DISCHARGE:                              OPERATIVE REPORT   SURGEON:  Rod Can, MD  ASSISTANT:  Staff.  PREOPERATIVE DIAGNOSIS:  Status post left total knee arthroplasty with acute left patellar tendon rupture.  POSTOPERATIVE DIAGNOSIS:  Status post left total knee arthroplasty with acute left patellar tendon rupture.  PROCEDURE PERFORMED:  Repair, left patellar tendon.  ANESTHESIA:  General.  EBL:  50 mL.  TOURNIQUET TIME:  60 minutes.  COMPLICATIONS:  None.  TUBES AND DRAINS:  Prevena incisional wound VAC.  DISPOSITION:  Stable to PACU.  COMPLICATIONS:  None.  INDICATIONS:  The patient is a 71 year old female who underwent primary left total knee arthroplasty on June 09, 2017.  She was doing very well until about a week ago.  She was ambulating, got her foot stuck and injured her knee.  She felt a pop.  Since that time, she has been having difficulty extending the knee, difficulty weightbearing.  She has had to go back on the walker.  I saw her in the office.  Physical exam was consistent with patellar tendon rupture.  We discussed the risks, benefits, alternatives to open repair of the left patellar tendon.  They understand that with an implant in place that the chances of healing are somewhat reduced.  They understand, they wished to proceed.  DESCRIPTION OF PROCEDURE IN DETAIL:  I identified the patient in the holding area.  Surgical site was marked by myself.  She was taken to the operating room, placed supine on the operating table, general anesthesia was induced.  All bony prominences were well padded.  Nonsterile tourniquet was applied to the left groin.  Left lower extremity was prepped and draped in normal  sterile surgical fashion.  Time-out was called verifying site and site of surgery.  I used an Geographical information systems officer to exsanguinate the lower extremity, I elevated the tourniquet to 300 mmHg. I used a #10 blade to sharply excise her previous incision.  I created full-thickness skin flaps.  I immediately identified her patellar tendon rupture.  She had ruptured the tendon off the inferior pole of the patella.  The retinacular repair just medial to this area was torn for about 1 cm both proximally and distally.  I debrided all loose suture material.  I copiously irrigated the knee joint itself with copious saline using pulse lavage.  I rongeured the distal pole of the patella to a bleeding bony surface. I then used a 2-mm drill bit to drill three longitudinal tunnels in the patella.  These were passed without any difficulty.  I then used a #2 FiberWire 2 stitches for a total of 4 limbs Krackow sutures within the patellar tendon.  I passed the 4 sutures through the 3 bone tunnels and the knee was brought into full extension.  I pulled on the sutures.  I cycled the knee  few times to reduce creep from the system.  The knee was brought back to full extension.  The sutures were tightened down over the bony bridge of the superior patella.  The repair was excellent.  I then repaired the torn retinaculum with #1 interrupted Vicryl.  I oversewed the entire retinacular repair with #1 Stratafix.  I then brought her knee through a range of motion.  I was able to flexor down past 60 degrees with no gapping of the repair, the repair was excellent.  I irrigated the superficial layer with saline.  I closed the deep fat with 2-0 Vicryl sutures.  I closed the deep dermal layer with 2-0 Monocryl.  Skin was closed with staples.  I then applied a Prevena incisional wound VAC according to the manufacture's instructions, got excellent suction. Tourniquet was let down.  Ace wrap was applied followed by a knee immobilizer.  The  patient was extubated, taken to the PACU in stable condition.  Sponge, needle and instrument counts were correct at the end of the case x2.  There were no known complications.  Postoperatively, we will admit the patient for overnight observation. She is to keep the knee in full extension at all times.  She is not allowed to flex the knee at all under any circumstances.  We will keep her in a knee immobilizer.  She may weightbear as tolerated in the knee immobilizer.  Prior to discharge, we will convert her to portable Prevena unit.  She will work with physical therapy.  Plan to discharge her tomorrow.  Place her on low-dose Apixaban for DVT prophylaxis.  I will need to see her in the office 7 days after discharge to remove her Prevena VAC.          ______________________________ Rod Can, MD     BS/MEDQ  D:  07/03/2017  T:  07/04/2017  Job:  [id number]

## 2017-07-04 NOTE — Progress Notes (Signed)
OT Cancellation Note  Patient Details Name: Carmen Ford MRN: [id number] DOB: 07/23/1946   Cancelled Treatment:    Reason Eval/Treat Not Completed: OT screened, no needs identified, will sign off.  Pt has BSC and assist for ADLs.  She will not be allowed to shower.  PT practiced 3:1 commode transfer  [place] 07/04/2017, [place], OTR/L [phone] 07/04/2017

## 2017-10-23 ENCOUNTER — Ambulatory Visit: Payer: Medicare Other | Admitting: Cardiology

## 2017-10-23 ENCOUNTER — Encounter: Payer: Self-pay | Admitting: Cardiology

## 2017-10-23 VITALS — BP 138/62 | HR 67 | Ht 66.0 in | Wt 230.0 lb

## 2017-10-23 DIAGNOSIS — I25119 Atherosclerotic heart disease of native coronary artery with unspecified angina pectoris: Secondary | ICD-10-CM | POA: Diagnosis not present

## 2017-10-23 DIAGNOSIS — I110 Hypertensive heart disease with heart failure: Secondary | ICD-10-CM | POA: Diagnosis not present

## 2017-10-23 DIAGNOSIS — I5032 Chronic diastolic (congestive) heart failure: Secondary | ICD-10-CM | POA: Diagnosis not present

## 2017-10-23 NOTE — Patient Instructions (Addendum)
Medication Instructions:  Your physician recommends that you continue on your current medications as directed. Please refer to the Current Medication list given to you today.  Labwork: None  Testing/Procedures: None  Follow-Up: Your physician wants you to follow-up in: 7 months. You will receive a reminder letter in the mail two months in advance. If you don't receive a letter, please call our office to schedule the follow-up appointment.  Any Other Special Instructions Will Be Listed Below (If Applicable).     If you need a refill on your cardiac medications before your next appointment, please call your pharmacy.    Heart Failure  Weigh yourself every morning when you first wake up and record on a calender or note pad, bring this to your office visits. Using a pill tender can help with taking your medications consistently.  Limit your fluid intake to 2 liters daily  Limit your sodium intake to less than 2-3 grams daily. Ask if you need dietary teaching.  If you gain more than 3 pounds (from your dry weight ), double your dose of diuretic for the day.  If you gain more than 5 pounds (from your dry weight), double your dose of lasix and call your heart failure doctor.  Please do not smoke tobacco since it is very bad for your heart.  Please do not drink alcohol since it can worsen your heart failure.Also avoid OTC nonsteroidal drugs, such as advil, aleve and motrin.  Try to exercise for at least 30 minutes every day because this will help your heart be more efficient. You may be eligible for supervised cardiac rehab, ask your physician.

## 2017-10-23 NOTE — Progress Notes (Signed)
Cardiology Office Note:    Date:  10/23/2017   ID:  Carmen Ford, DOB 04-Aug-1946, MRN [id number]  PCP:  Dellia Beckwith, PA-C  Cardiologist:  Shirlee More, MD    Referring MD: Dellia Beckwith, *    ASSESSMENT:    1. Chronic diastolic heart failure ([place])   2. Coronary artery disease involving native coronary artery of native heart with angina pectoris ([place])   3. Hypertensive heart disease with heart failure ([place])    PLAN:    In order of problems listed above:  1. Stable continue current diuretic sodium restriction home self management 2. Stable continue current medical treatment including aspirin and statin 3. Stable blood pressure target continue current medical treatment avoid beta-blockers with previous symptomatic bradycardia 4. Stable hyperlipidemia continue her low intensity statin   Next appointment: 6 months   Medication Adjustments/Labs and Tests Ordered: Current medicines are reviewed at length with the patient today.  Concerns regarding medicines are outlined above.  No orders of the defined types were placed in this encounter.  No orders of the defined types were placed in this encounter.   Chief Complaint  Patient presents with  . Follow-up  . Congestive Heart Failure  . Coronary Artery Disease  . Hyperlipidemia  . Hypertension    History of Present Illness:    Carmen Ford is a 71 y.o. female with a hx of CAD heart failure CKD hypertension hyperlipidemia last seen 6 months ago.  She has had a successful total knee arthroplasty in the interim.  Routine labs were performed in the last week at her PCP office requested she continues to follow with nephrology and hematology. Compliance with diet, lifestyle and medications: Yes Past Medical History:  Diagnosis Date  . Anemia   . Anemia in chronic kidney disease 10/15/2016  . Bradycardia    Overview:  Not on a beta blocker with sinus bradycardia and Mobitz 1 second degree AV block  . Chronic  diastolic heart failure ([place]) 02/26/2016   Overview:  Overview:  EF normal by echo 2013  . Chronic ischemic heart disease   . CKD (chronic kidney disease) 05/15/2017  . Colitis, acute   . Coronary artery disease    CABG  2011: LTA to LAD, SVG to M, SVG to PDA EF 40%  . Diabetes mellitus without complication ([place])    type II   . Essential (primary) hypertension 05/15/2017  . Fatigue 10/15/2016  . Hyperlipidemia 05/15/2017  . Hypertensive heart disease with heart failure ([place])   . Myocardial infarction ([place])   . 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 Restless legs syndrome 05/15/2017  . Vitamin D deficiency 05/15/2017    Past Surgical History:  Procedure Laterality Date  . ACHILLES TENDON SURGERY Left 2005   inserted a new tendon  . CARDIAC SURGERY    . CHOLECYSTECTOMY  02/2016  . COLONOSCOPY    . CORONARY ARTERY BYPASS GRAFT  2011   [facility]  . KNEE ARTHROPLASTY Left 06/09/2017   Procedure: LEFT TOTAL KNEE ARTHROPLASTY WITH COMPUTER NAVIGATION;  Surgeon: Rod Can, MD;  Location: [facility];  Service: Orthopedics;  Laterality: Left;  NEED RNFA  . PATELLAR TENDON REPAIR Left 07/03/2017   Procedure: PATELLA TENDON REPAIR LEFT KNEE;  Surgeon: Rod Can, MD;  Location: [facility];  Service: Orthopedics;  Laterality: Left;  [name] VARICOSE VEIN SURGERY Bilateral 1990's    Current Medications: Current Meds  Medication Sig  . amLODipine (NORVASC) 10 MG tablet Take 10 mg by mouth daily.   [name]  Cholecalciferol (EQL VITAMIN D3) 2000 units CAPS Take 2,000 mg by mouth daily.  . furosemide (LASIX) 20 MG tablet Take 20-40 mg by mouth every Monday, Wednesday, and Friday. Take 1 tablet (20 mg) on Tuesday, Thursday, Saturday, and Sunday; then take 2 tablets (40 mg) on Monday, Wednesday, & Friday--in the morning.  . gabapentin (NEURONTIN) 400 MG capsule Take 400 mg by mouth 3 (three) times daily.   . insulin glargine (LANTUS) 100 UNIT/ML injection Inject 15-25 Units into the skin at bedtime. Take 25  units if blood glucose is over 100 & take 15 units if blood glucose is lower than 100.  . insulin lispro (HUMALOG) 100 UNIT/ML injection Inject 2-19 Units into the skin 3 (three) times daily with meals.   [name] losartan (COZAAR) 25 MG tablet Take 25 mg by mouth every evening.   [name] omega-3 acid ethyl esters (LOVAZA) 1 G capsule Take 2 g by mouth 2 (two) times daily.   . pravastatin (PRAVACHOL) 40 MG tablet Take 20 mg by mouth daily.   [name] rOPINIRole (REQUIP) 2 MG tablet Take 2 mg by mouth at bedtime.      Allergies:   Penicillins and Statins   Social History   Socioeconomic History  . Marital status: Married    Spouse name: None  . Number of children: None  . Years of education: None  . Highest education level: None  Social Needs  . Financial resource strain: None  . Food insecurity - worry: None  . Food insecurity - inability: None  . Transportation needs - medical: None  . Transportation needs - non-medical: None  Occupational History  . None  Tobacco Use  . Smoking status: Never Smoker  . Smokeless tobacco: Never Used  Substance and Sexual Activity  . Alcohol use: No    Alcohol/week: 0.0 oz  . Drug use: No  . Sexual activity: None  Other Topics Concern  . None  Social History Narrative  . None     Family History: The patient's family history includes Congestive Heart Failure in her father; Diabetes in her mother; Hypertension in her mother. ROS:   Please see the history of present illness.    All other systems reviewed and are negative.  EKGs/Labs/Other Studies Reviewed:    The following studies were reviewed today:   Recent Labs: 07/03/2017: BUN 37; Creatinine, Ser 1.52; Hemoglobin 8.9; Platelets 243; Potassium 4.5; Sodium 136  Recent Lipid Panel No results found for: CHOL, TRIG, HDL, CHOLHDL, VLDL, LDLCALC, LDLDIRECT  Physical Exam:    VS:  BP 138/62 (BP Location: Right Arm, Patient Position: Sitting)   Pulse 67   Ht 5\' 6"  (1.676 m)   Wt 230 lb (104.3 kg)    SpO2 98%   BMI 37.12 kg/m     Wt Readings from Last 3 Encounters:  10/23/17 230 lb (104.3 kg)  07/03/17 235 lb 2 oz (106.7 kg)  06/09/17 242 lb 11.2 oz (110.1 kg)     GEN:  Well nourished, well developed in no acute distress HEENT: Normal NECK: No JVD; No carotid bruits LYMPHATICS: No lymphadenopathy CARDIAC: RRR, no murmurs, rubs, gallops RESPIRATORY:  Clear to auscultation without rales, wheezing or rhonchi  ABDOMEN: Soft, non-tender, non-distended MUSCULOSKELETAL:  No edema; No deformity  SKIN: Warm and dry NEUROLOGIC:  Alert and oriented x 3 PSYCHIATRIC:  Normal affect    Signed, Shirlee More, MD  10/23/2017 1:56 PM    [facility]

## 2017-12-24 DIAGNOSIS — M6790 Unspecified disorder of synovium and tendon, unspecified site: Secondary | ICD-10-CM | POA: Insufficient documentation

## 2017-12-24 DIAGNOSIS — M7740 Metatarsalgia, unspecified foot: Secondary | ICD-10-CM | POA: Insufficient documentation

## 2017-12-24 DIAGNOSIS — M67979 Unspecified disorder of synovium and tendon, unspecified ankle and foot: Secondary | ICD-10-CM | POA: Insufficient documentation

## 2017-12-24 DIAGNOSIS — M216X2 Other acquired deformities of left foot: Secondary | ICD-10-CM | POA: Insufficient documentation

## 2017-12-24 DIAGNOSIS — M2040 Other hammer toe(s) (acquired), unspecified foot: Secondary | ICD-10-CM | POA: Insufficient documentation

## 2017-12-24 HISTORY — DX: Metatarsalgia, unspecified foot: M77.40

## 2017-12-24 HISTORY — DX: Other acquired deformities of left foot: M21.6X2

## 2017-12-24 HISTORY — DX: Unspecified disorder of synovium and tendon, unspecified site: M67.90

## 2017-12-24 HISTORY — DX: Other hammer toe(s) (acquired), unspecified foot: M20.40

## 2018-06-30 DIAGNOSIS — Z96659 Presence of unspecified artificial knee joint: Secondary | ICD-10-CM

## 2018-06-30 HISTORY — DX: Presence of unspecified artificial knee joint: Z96.659

## 2018-09-30 DIAGNOSIS — R001 Bradycardia, unspecified: Secondary | ICD-10-CM | POA: Insufficient documentation

## 2018-09-30 DIAGNOSIS — E875 Hyperkalemia: Secondary | ICD-10-CM | POA: Insufficient documentation

## 2018-09-30 DIAGNOSIS — H539 Unspecified visual disturbance: Secondary | ICD-10-CM | POA: Insufficient documentation

## 2018-09-30 HISTORY — DX: Bradycardia, unspecified: R00.1

## 2018-09-30 HISTORY — DX: Unspecified visual disturbance: H53.9

## 2018-09-30 HISTORY — DX: Hyperkalemia: E87.5

## 2018-12-10 NOTE — Progress Notes (Signed)
Cardiology Office Note:    Date:  12/11/2018   ID:  Carmen Ford, DOB 1946-02-12, MRN [id number]  PCP:  Carmen Beckwith, PA-C  Cardiologist:  Carmen More, MD    Referring MD: Carmen Ford, *    ASSESSMENT:    1. Persistent atrial fibrillation   2. Chronic diastolic heart failure ([place])   3. Coronary artery disease involving native coronary artery of native heart with angina pectoris ([place])   4. Essential (primary) hypertension   5. Bradycardia   6. Hyperlipidemia, unspecified hyperlipidemia type   7. Stage 3 chronic kidney disease ([place])    PLAN:    In order of problems listed above:  1. Presently her biggest problem she has sick sinus syndrome she has had multiple interactions in the past with transient AV block most recently in the setting of acute renal failure attributed to hyperkalemia but her potassium was never severely elevated.  I think the best approach here right now some lower to remain in atrial fibrillation switch from aspirin to anticoagulant with moderate stroke risk avoid suppressant treatment referred to EP for consideration of permanent pacing resumption of sinus rhythm and then suppressant treatment.  I reviewed with the patient and granddaughter and they are in agreement. 2. Compensated continue current diuretic at high risk of recurrent acute renal failure check labs today including potassium 3. Stable at this time I do not think she requires an ischemia evaluation 4. Stable continue current treatment avoid beta-blockers rate limiting calcium channel blockers or ARB agents with recent hyperkalemia directed by her nephrologist in [place] 5. She has sick sinus syndrome referred to EP for consideration of management of atrial fibrillation whether to try to resume sinus rhythm and decision about the advisability of a backup permanent pacemaker if she needs suppressive antiarrhythmic therapy 6. Stable continue her statin 7. Recently worsened with acute  renal failure managed by nephrologist she has very brittle renal function and recheck her potassium and creatinine today 8. Presently treated for bronchitis continue her current antibiotics   Next appointment: 4 weeks   Medication Adjustments/Labs and Tests Ordered: Current medicines are reviewed at length with the patient today.  Concerns regarding medicines are outlined above.  Orders Placed This Encounter  Procedures  . Comp Met (CMET)  . Pro b natriuretic peptide (BNP)  . Ambulatory referral to Cardiac Electrophysiology  . EKG 12-Lead   Meds ordered this encounter  Medications  . apixaban (ELIQUIS) 5 MG TABS tablet    Sig: Take 1 tablet (5 mg total) by mouth 2 (two) times daily.    Dispense:  60 tablet    Refill:  3    Chief Complaint  Patient presents with  . Follow-up  . Coronary Artery Disease  . Congestive Heart Failure    History of Present Illness:    Carmen Ford is a 73 y.o. female with a hx of CAD CABG  heart failure stage 4 CKD hypertension hyperlipidemia and bradycardia second degree AV block with hyperkalemia and a beta blocker last seen 10/23/17. Compliance with diet, lifestyle and medications: Yes  She is not doing well she was not [facility] on Sunday treated as pneumonia by chest x-ray is clear cough shortness of breath and purulent sputum.  Prior that she was admitted to the hospital with acute renal failure and recurrent AV block.  This is been an ongoing problem except this time although her creatinine was elevated her potassium was only in the range of 5 4 or 5  5.  She feels very weak debilitated short of breath with activity no chest pain edema orthopnea syncope or TIA.  Today she is in atrial fibrillation controlled rate.  When she was in the emergency room on Sunday did not have an EKG performed.  I reviewed with Carmen Ford and her granddaughter that she has sick sinus syndrome she has had several acute medical interactions with AV block and bradycardia  typically in the setting of acute renal failure and hyperkalemia however this episode did not have significant hyperkalemia I think the solution to her problem would be to have a backup pacemaker and then be able to resume sinus rhythm and maintain it with antiarrhythmic drugs and suppressive therapy.  I would have her stop aspirin start oral anticoagulant she does not need rate slowing medications her heart rate is 79 bpm today and referred to my EP colleagues.  Recent echocardiogram showed normal left ventricular function clinically is not in heart failure but will recheck labs today including a proBNP renal function and potassium.  She wants to have her cardiology care done through my practice. Past Medical History:  Diagnosis Date  . Anemia   . Anemia in chronic kidney disease 10/15/2016  . Bradycardia    Overview:  Not on a beta blocker with sinus bradycardia and Mobitz 1 second degree AV block  . Chronic diastolic heart failure ([place]) 02/26/2016   Overview:  Overview:  EF normal by echo 2013  . Chronic ischemic heart disease   . CKD (chronic kidney disease) 05/15/2017  . Colitis, acute   . Coronary artery disease    CABG  2011: LTA to LAD, SVG to M, SVG to PDA EF 40%  . Diabetes mellitus without complication ([place])    type II   . Essential (primary) hypertension 05/15/2017  . Fatigue 10/15/2016  . Hyperlipidemia 05/15/2017  . Hypertensive heart disease with heart failure ([place])   . Myocardial infarction ([place])   . 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 Restless legs syndrome 05/15/2017  . Vitamin D deficiency 05/15/2017    Past Surgical History:  Procedure Laterality Date  . ACHILLES TENDON SURGERY Left 2005   inserted a new tendon  . CARDIAC SURGERY    . CHOLECYSTECTOMY  02/2016  . COLONOSCOPY    . CORONARY ARTERY BYPASS GRAFT  2011   [facility]  . KNEE ARTHROPLASTY Left 06/09/2017   Procedure: LEFT TOTAL KNEE ARTHROPLASTY WITH COMPUTER NAVIGATION;  Surgeon: Carmen Can, MD;   Location: [facility];  Service: Orthopedics;  Laterality: Left;  NEED RNFA  . PATELLAR TENDON REPAIR Left 07/03/2017   Procedure: PATELLA TENDON REPAIR LEFT KNEE;  Surgeon: Carmen Can, MD;  Location: [facility];  Service: Orthopedics;  Laterality: Left;  [name] VARICOSE VEIN SURGERY Bilateral 1990's    Current Medications: Current Meds  Medication Sig  . amLODipine (NORVASC) 10 MG tablet Take 10 mg by mouth daily.   . benzonatate (TESSALON) 100 MG capsule TAKE 1 CAPSULE BY MOUTH EVERY 8 HOURS FOR 7 DAYS  . Cholecalciferol (EQL VITAMIN D3) 2000 units CAPS Take 2,000 mg by mouth daily.  [name] gabapentin (NEURONTIN) 400 MG capsule Take 400 mg by mouth 3 (three) times daily.   . insulin glargine (LANTUS) 100 UNIT/ML injection Inject 15-25 Units into the skin at bedtime. Take 25 units if blood glucose is over 100 & take 15 units if blood glucose is lower than 100.  . insulin lispro (HUMALOG) 100 UNIT/ML injection Inject 2-19 Units into the skin 3 (three) times  daily with meals.   [name] levofloxacin (LEVAQUIN) 750 MG tablet TAKE 1 TABLET BY MOUTH ONCE DAILY FOR 5 DAYS  . omega-3 acid ethyl esters (LOVAZA) 1 G capsule Take 2 g by mouth 2 (two) times daily.   . pravastatin (PRAVACHOL) 40 MG tablet Take 20 mg by mouth daily.   [name] rOPINIRole (REQUIP) 2 MG tablet Take 2 mg by mouth at bedtime.   . [DISCONTINUED] aspirin EC 81 MG tablet Take 1 tablet by mouth daily.     Allergies:   Penicillins and Statins   Social History   Socioeconomic History  . Marital status: Married    Spouse name: Not on file  . Number of children: Not on file  . Years of education: Not on file  . Highest education level: Not on file  Occupational History  . Not on file  Social Needs  . Financial resource strain: Not on file  . Food insecurity:    Worry: Not on file    Inability: Not on file  . Transportation needs:    Medical: Not on file    Non-medical: Not on file  Tobacco Use  . Smoking status: Never Smoker  . Smokeless  tobacco: Never Used  Substance and Sexual Activity  . Alcohol use: No    Alcohol/week: 0.0 standard drinks  . Drug use: No  . Sexual activity: Not on file  Lifestyle  . Physical activity:    Days per week: Not on file    Minutes per session: Not on file  . Stress: Not on file  Relationships  . Social connections:    Talks on phone: Not on file    Gets together: Not on file    Attends religious service: Not on file    Active member of club or organization: Not on file    Attends meetings of clubs or organizations: Not on file    Relationship status: Not on file  Other Topics Concern  . Not on file  Social History Narrative  . Not on file     Family History: The patient's family history includes Congestive Heart Failure in her father; Diabetes in her mother; Hypertension in her mother. ROS:   Please see the history of present illness.    All other systems reviewed and are negative.  EKGs/Labs/Other Studies Reviewed:    The following studies were reviewed today:  EKG:  EKG ordered today.  The ekg ordered today demonstrates atrial fibrillation controlled rate  Echo 09/30/18:  Definity was used to optimize study.  Global left ventricular wall motion and contractility are within normal limits. The estimated ejection fraction is 60-65%.  Moderate concentric left ventricular hypertrophy is observed. The left atrium is moderately dilated. There is mild mitral regurgitation.  There is mild tricuspid regurgitation. There is no pericardial effusion.  Recent Labs:  10/16/18 cr 1.37 GFR 37 No results found for requested labs within last 8760 hours.  Recent Lipid Panel No results found for: CHOL, TRIG, HDL, CHOLHDL, VLDL, LDLCALC, LDLDIRECT  Physical Exam:    VS:  BP 130/66 (BP Location: Right Arm, Patient Position: Sitting, Cuff Size: Large)   Pulse 79   Ht '5\' 5"'  (1.651 m)   Wt 229 lb 9.6 oz (104.1 kg)   SpO2 91%   BMI 38.21 kg/m     Wt Readings from Last 3  Encounters:  12/11/18 229 lb 9.6 oz (104.1 kg)  10/23/17 230 lb (104.3 kg)  07/03/17 235 lb 2 oz (106.7 kg)  GEN:  Well nourished, well developed in no acute distress HEENT: Normal NECK: No JVD; No carotid bruits LYMPHATICS: No lymphadenopathy CARDIAC: Irregular rhythm variable first heart sound , no murmurs, rubs, gallops RESPIRATORY:  Clear to auscultation without rales, wheezing or rhonchi  ABDOMEN: Soft, non-tender, non-distended MUSCULOSKELETAL:  No edema; No deformity  SKIN: Warm and dry NEUROLOGIC:  Alert and oriented x 3 PSYCHIATRIC:  Normal affect    Signed, Carmen More, MD  12/11/2018 8:53 AM    [place]

## 2018-12-11 ENCOUNTER — Encounter: Payer: Self-pay | Admitting: Cardiology

## 2018-12-11 ENCOUNTER — Ambulatory Visit (INDEPENDENT_AMBULATORY_CARE_PROVIDER_SITE_OTHER): Payer: Medicare Other | Admitting: Cardiology

## 2018-12-11 VITALS — BP 130/66 | HR 79 | Ht 65.0 in | Wt 229.6 lb

## 2018-12-11 DIAGNOSIS — I5032 Chronic diastolic (congestive) heart failure: Secondary | ICD-10-CM

## 2018-12-11 DIAGNOSIS — N183 Chronic kidney disease, stage 3 unspecified: Secondary | ICD-10-CM

## 2018-12-11 DIAGNOSIS — I25119 Atherosclerotic heart disease of native coronary artery with unspecified angina pectoris: Secondary | ICD-10-CM | POA: Diagnosis not present

## 2018-12-11 DIAGNOSIS — I10 Essential (primary) hypertension: Secondary | ICD-10-CM | POA: Diagnosis not present

## 2018-12-11 DIAGNOSIS — I4819 Other persistent atrial fibrillation: Secondary | ICD-10-CM | POA: Diagnosis not present

## 2018-12-11 DIAGNOSIS — I480 Paroxysmal atrial fibrillation: Secondary | ICD-10-CM

## 2018-12-11 DIAGNOSIS — E785 Hyperlipidemia, unspecified: Secondary | ICD-10-CM

## 2018-12-11 DIAGNOSIS — R001 Bradycardia, unspecified: Secondary | ICD-10-CM

## 2018-12-11 HISTORY — DX: Other persistent atrial fibrillation: I48.19

## 2018-12-11 HISTORY — DX: Paroxysmal atrial fibrillation: I48.0

## 2018-12-11 MED ORDER — APIXABAN 5 MG PO TABS: 5.0000 mg | ORAL_TABLET | Freq: Two times a day (BID) | ORAL | 3 refills | Status: DC

## 2018-12-11 NOTE — Patient Instructions (Addendum)
Medication Instructions:  Your physician has recommended you make the following change in your medication:  STOP: Aspirin START: Eliquis 5mg  twice daily  If you need a refill on your cardiac medications before your next appointment, please call your pharmacy.   Lab work: You will have the following lab work today: CMP and ProBNP  If you have labs (blood work) drawn today and your tests are completely normal, you will receive your results only by: [name] MyChart Message (if you have MyChart) OR . A paper copy in the mail If you have any lab test that is abnormal or we need to change your treatment, we will call you to review the results.  Testing/Procedures: You had an EKG today.  Follow-Up: At [facility], you and your health needs are our priority.  As part of our continuing mission to provide you with exceptional heart care, we have created designated Provider Care Teams.  These Care Teams include your primary Cardiologist (physician) and Advanced Practice Providers (APPs -  Physician Assistants and Nurse Practitioners) who all work together to provide you with the care you need, when you need it. You will need a follow up appointment in 6 weeks.  Please call our office 2 months in advance to schedule this appointment.    You will be scheduled for an appointment in the [facility] office on [address] with Dr. Curt Bears.

## 2018-12-14 ENCOUNTER — Encounter: Payer: Self-pay | Admitting: *Deleted

## 2018-12-14 ENCOUNTER — Other Ambulatory Visit: Payer: Self-pay | Admitting: *Deleted

## 2018-12-14 DIAGNOSIS — E1140 Type 2 diabetes mellitus with diabetic neuropathy, unspecified: Secondary | ICD-10-CM | POA: Insufficient documentation

## 2018-12-14 DIAGNOSIS — E1142 Type 2 diabetes mellitus with diabetic polyneuropathy: Secondary | ICD-10-CM

## 2018-12-14 HISTORY — DX: Type 2 diabetes mellitus with diabetic polyneuropathy: E11.42

## 2018-12-14 LAB — COMPREHENSIVE METABOLIC PANEL
ALBUMIN: 3.3 g/dL — AB (ref 3.7–4.7)
ALK PHOS: 73 IU/L (ref 39–117)
ALT: 10 IU/L (ref 0–32)
AST: 9 IU/L (ref 0–40)
Albumin/Globulin Ratio: 1.2 (ref 1.2–2.2)
BILIRUBIN TOTAL: 0.2 mg/dL (ref 0.0–1.2)
BUN / CREAT RATIO: 19 (ref 12–28)
BUN: 23 mg/dL (ref 8–27)
CHLORIDE: 103 mmol/L (ref 96–106)
CO2: 22 mmol/L (ref 20–29)
Calcium: 8.6 mg/dL — ABNORMAL LOW (ref 8.7–10.3)
Creatinine, Ser: 1.22 mg/dL — ABNORMAL HIGH (ref 0.57–1.00)
GFR calc Af Amer: 51 mL/min/{1.73_m2} — ABNORMAL LOW (ref >59)
GFR calc non Af Amer: 44 mL/min/{1.73_m2} — ABNORMAL LOW (ref >59)
Globulin, Total: 2.8 g/dL (ref 1.5–4.5)
Glucose: 100 mg/dL — ABNORMAL HIGH (ref 65–99)
Potassium: 4.7 mmol/L (ref 3.5–5.2)
SODIUM: 140 mmol/L (ref 134–144)
Total Protein: 6.1 g/dL (ref 6.0–8.5)

## 2018-12-14 LAB — PRO B NATRIURETIC PEPTIDE: NT-PRO BNP: 2254 pg/mL — AB (ref 0–301)

## 2018-12-18 ENCOUNTER — Encounter: Payer: Self-pay | Admitting: Cardiology

## 2018-12-18 ENCOUNTER — Ambulatory Visit: Payer: Medicare Other | Admitting: Cardiology

## 2018-12-18 VITALS — BP 176/72 | HR 77 | Ht 65.0 in | Wt 231.0 lb

## 2018-12-18 DIAGNOSIS — I4819 Other persistent atrial fibrillation: Secondary | ICD-10-CM

## 2018-12-18 NOTE — Patient Instructions (Addendum)
Medication Instructions:  Your physician recommends that you continue on your current medications as directed. Please refer to the Current Medication list given to you today.  * If you need a refill on your cardiac medications before your next appointment, please call your pharmacy.   Labwork: None ordered  Testing/Procedures: None ordered  Follow-Up: To be determined.   Thank you for choosing [facility]!!   Trinidad Curet, RN [phone]  Any Other Special Instructions Will Be Listed Below (If Applicable). Dr. Curt Bears is going to discuss with Dr. Bettina Gavia and we will call you about treatment plan.

## 2018-12-18 NOTE — Progress Notes (Signed)
Electrophysiology Office Note   Date:  12/18/2018   ID:  Carmen Ford, DOB Jan 13, 1946, MRN [id number]  PCP:  Dellia Beckwith, PA-C  Cardiologist:  Bettina Gavia Primary Electrophysiologist:  Ichiro Chesnut Meredith Leeds, MD    No chief complaint on file.    History of Present Illness: Carmen Ford is a 73 y.o. female who is being seen today for the evaluation of atrial fibrillation at the request of Bettina Gavia, Hilton Cork, MD. Presenting today for electrophysiology evaluation.  She has a history of persistent atrial fibrillation, chronic diastolic heart failure, coronary artery disease, hypertension, hyperlipidemia, and stage III CKD.  She was recently diagnosed with pneumonia and was hospitalized.  She was also in acute on chronic renal failure.  During her hospitalization, she had episodes of AV block.  She presented to her primary cardiologist office in atrial fibrillation.  She has had multiple episodes of AV block and bradycardia typically in the setting of acute renal failure and hyperkalemia.  Her most recent episode, she did not have hyperkalemia.    Today, she denies symptoms of palpitations, chest pain, shortness of breath, orthopnea, PND, lower extremity edema, claudication, dizziness, presyncope, syncope, bleeding, or neurologic sequela. The patient is tolerating medications without difficulties.  Her main complaint today is of weakness, fatigue, and shortness of breath.  She was told at her last visit that she was in atrial fibrillation.  Her weakness and fatigue has been occurring for the last few weeks.   Past Medical History:  Diagnosis Date  . Anemia   . Anemia in chronic kidney disease 10/15/2016  . Bradycardia    Overview:  Not on a beta blocker with sinus bradycardia and Mobitz 1 second degree AV block  . Chronic diastolic heart failure ([place]) 02/26/2016   Overview:  Overview:  EF normal by echo 2013  . Chronic ischemic heart disease   . CKD (chronic kidney disease) 05/15/2017  .  Colitis, acute   . Coronary artery disease    CABG  2011: LTA to LAD, SVG to M, SVG to PDA EF 40%  . Diabetes mellitus without complication ([place])    type II   . Essential (primary) hypertension 05/15/2017  . Fatigue 10/15/2016  . Hyperlipidemia 05/15/2017  . Hypertensive heart disease with heart failure ([place])   . Myocardial infarction ([place])   . 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 Restless legs syndrome 05/15/2017  . Vitamin D deficiency 05/15/2017   Past Surgical History:  Procedure Laterality Date  . ACHILLES TENDON SURGERY Left 2005   inserted a new tendon  . CARDIAC SURGERY    . CHOLECYSTECTOMY  02/2016  . COLONOSCOPY    . CORONARY ARTERY BYPASS GRAFT  2011   [facility]  . KNEE ARTHROPLASTY Left 06/09/2017   Procedure: LEFT TOTAL KNEE ARTHROPLASTY WITH COMPUTER NAVIGATION;  Surgeon: Rod Can, MD;  Location: [facility];  Service: Orthopedics;  Laterality: Left;  NEED RNFA  . PATELLAR TENDON REPAIR Left 07/03/2017   Procedure: PATELLA TENDON REPAIR LEFT KNEE;  Surgeon: Rod Can, MD;  [facility];  Service: Orthopedics;  Laterality: Left;  [name] VARICOSE VEIN SURGERY Bilateral 1990's     Current Outpatient Medications  Medication Sig Dispense Refill  . amLODipine (NORVASC) 10 MG tablet Take 10 mg by mouth daily.     [name] apixaban (ELIQUIS) 5 MG TABS tablet Take 1 tablet (5 mg total) by mouth 2 (two) times daily. 60 tablet 3  . benzonatate (TESSALON) 100 MG capsule TAKE 1 CAPSULE BY MOUTH EVERY  8 HOURS FOR 7 DAYS    . Cholecalciferol (EQL VITAMIN D3) 2000 units CAPS Take 2,000 mg by mouth daily.    [name] gabapentin (NEURONTIN) 400 MG capsule Take 400 mg by mouth 3 (three) times daily.     . insulin glargine (LANTUS) 100 UNIT/ML injection Inject 15-25 Units into the skin at bedtime. Take 25 units if blood glucose is over 100 & take 15 units if blood glucose is lower than 100.    . insulin lispro (HUMALOG) 100 UNIT/ML injection Inject 2-19 Units into the skin 3 (three) times  daily with meals.     [name] omega-3 acid ethyl esters (LOVAZA) 1 G capsule Take 2 g by mouth 2 (two) times daily.     . pravastatin (PRAVACHOL) 40 MG tablet Take 20 mg by mouth daily.     [name] rOPINIRole (REQUIP) 2 MG tablet Take 2 mg by mouth at bedtime.      No current facility-administered medications for this visit.     Allergies:   Penicillins and Statins   Social History:  The patient  reports that she has never smoked. She has never used smokeless tobacco. She reports that she does not drink alcohol or use drugs.   Family History:  The patient's family history includes Cancer in her brother; Congestive Heart Failure in her father; Diabetes in her mother; Heart failure in her mother; Hypertension in her mother and son.    ROS:  Please see the history of present illness.   Otherwise, review of systems is positive for leg pain, SOB, balance problems.   All other systems are reviewed and negative.    PHYSICAL EXAM: VS:  There were no vitals taken for this visit. , BMI There is no height or weight on file to calculate BMI. GEN: Well nourished, well developed, in no acute distress  HEENT: normal  Neck: no JVD, carotid bruits, or masses Cardiac: iRRR; no murmurs, rubs, or gallops,no edema  Respiratory:  clear to auscultation bilaterally, normal work of breathing GI: soft, nontender, nondistended, + BS MS: no deformity or atrophy  Skin: warm and dry Neuro:  Strength and sensation are intact Psych: euthymic mood, full affect  EKG:  EKG is not ordered today. Personal review of the ekg ordered 12/11/18 shows atrial fibrillation  Recent Labs: 12/14/2018: ALT 10; BUN 23; Creatinine, Ser 1.22; NT-Pro BNP 2,254; Potassium 4.7; Sodium 140    Lipid Panel  No results found for: CHOL, TRIG, HDL, CHOLHDL, VLDL, LDLCALC, LDLDIRECT   Wt Readings from Last 3 Encounters:  12/11/18 229 lb 9.6 oz (104.1 kg)  10/23/17 230 lb (104.3 kg)  07/03/17 235 lb 2 oz (106.7 kg)      Other studies  Reviewed: Additional studies/ records that were reviewed today include: Epic notes   ASSESSMENT AND PLAN:  1.  Sick sinus syndrome: Also associated with complete heart block when she has been sick with acute renal failure.  She has atrial fibrillation and thus further therapy has been difficult.  It is unclear to me whether or not she would benefit from a pacemaker.  I Sydne Krahl discuss this with her primary cardiologist.  2.  Persistent atrial fibrillation: Currently on Eliquis.  She Rumaysa Sabatino likely require cardioversion in the future.  This patients CHA2DS2-VASc Score and unadjusted Ischemic Stroke Rate (% per year) is equal to 4.8 % stroke rate/year from a score of 4  Above score calculated as 1 point each if present [CHF, HTN, DM, Vascular=MI/PAD/Aortic Plaque, Age if 95-74,  or Female] Above score calculated as 2 points each if present [Age > 75, or Stroke/TIA/TE]      Current medicines are reviewed at length with the patient today.   The patient does not have concerns regarding her medicines.  The following changes were made today:  none  Labs/ tests ordered today include:  No orders of the defined types were placed in this encounter.    Disposition:   FU with Shannin Naab 3 months  Signed, Freman Lapage Meredith Leeds, MD  12/18/2018 1:45 PM     [place] [place] [address] [phone] (office) [phone] (fax)

## 2019-01-08 ENCOUNTER — Telehealth: Payer: Self-pay | Admitting: *Deleted

## 2019-01-08 NOTE — Telephone Encounter (Signed)
Updated pt that Dr. Curt Bears and [facility] spoke.  Recommendation is to discuss PPM implant. Pt agreeable She is scheduled to see Camnitz on 3/30 in [place].

## 2019-01-11 ENCOUNTER — Telehealth: Payer: Self-pay | Admitting: *Deleted

## 2019-01-11 NOTE — Telephone Encounter (Signed)
-----   Message from Richardo Priest, MD sent at 01/09/2019 11:21 AM EST ----- Regarding: RE: OV 3/24 No need to see me TY ----- Message ----- From: [name], RN Sent: 01/08/2019   5:31 PM EST To: Richardo Priest, MD, Austin Miles, RN Subject: Ok Edwards 3/24                                        Pt is scheduled for OV w/ you on 3/24.  She is scheduled to see Camnitz on 3/30 to discuss PPM implant.  Does she still need to see you guys on 3/24?  Please let pt know your recommendation.  Thx :) Trinidad Curet, RN

## 2019-01-11 NOTE — Telephone Encounter (Signed)
Pt informed. Agreeable to keeping 3/30 appt w/ Camnitz.

## 2019-01-11 NOTE — Telephone Encounter (Signed)
Richardo Priest, MD  [name], RN        No need to see me TY

## 2019-01-22 ENCOUNTER — Encounter: Payer: Self-pay | Admitting: Cardiology

## 2019-01-25 ENCOUNTER — Ambulatory Visit: Payer: Medicare Other | Admitting: Cardiology

## 2019-01-26 ENCOUNTER — Ambulatory Visit: Payer: Medicare Other | Admitting: Cardiology

## 2019-01-29 ENCOUNTER — Other Ambulatory Visit: Payer: Self-pay | Admitting: *Deleted

## 2019-01-29 NOTE — Telephone Encounter (Signed)
LVM on home phone to cal the office pertaining to appointment on Monday at the [place] office.

## 2019-01-29 NOTE — Telephone Encounter (Signed)
Pt called returning Terin's call. Please contact patient on her cell number.

## 2019-02-01 ENCOUNTER — Ambulatory Visit: Payer: Medicare Other | Admitting: Cardiology

## 2019-02-18 ENCOUNTER — Encounter: Payer: Self-pay | Admitting: Cardiology

## 2019-02-18 ENCOUNTER — Other Ambulatory Visit: Payer: Self-pay

## 2019-02-18 ENCOUNTER — Telehealth (INDEPENDENT_AMBULATORY_CARE_PROVIDER_SITE_OTHER): Payer: Medicare Other | Admitting: Cardiology

## 2019-02-18 ENCOUNTER — Telehealth: Payer: Self-pay | Admitting: *Deleted

## 2019-02-18 DIAGNOSIS — R001 Bradycardia, unspecified: Secondary | ICD-10-CM | POA: Diagnosis not present

## 2019-02-18 NOTE — Telephone Encounter (Signed)
Scheduled pt for telephone visit w/ [facility] this morning.  Pt does not have a smart phone.     Virtual Visit Pre-Appointment Phone Call  Steps For Call:  1. Confirm consent - "In the setting of the current Covid19 crisis, you are scheduled for a (phone or video) visit with your provider on (date) at (time).  Just as we do with many in-office visits, in order for you to participate in this visit, we must obtain consent.  If you'd like, I can send this to your mychart (if signed up) or email for you to review.  Otherwise, I can obtain your verbal consent now.  All virtual visits are billed to your insurance company just like a normal visit would be.  By agreeing to a virtual visit, we'd like you to understand that the technology does not allow for your provider to perform an examination, and thus may limit your provider's ability to fully assess your condition.  Finally, though the technology is pretty good, we cannot assure that it will always work on either your or our end, and in the setting of a video visit, we may have to convert it to a phone-only visit.  In either situation, we cannot ensure that we have a secure connection.  Are you willing to proceed?" STAFF: Did the patient verbally acknowledge consent to telehealth visit? Document YES/NO here: YES  2. Confirm the BEST phone number to call the day of the visit by including in appointment notes  3. Give patient instructions for WebEx/MyChart download to smartphone as below or Doximity/Doxy.me if video visit (depending on what platform provider is using)  4. Advise patient to be prepared with their blood pressure, heart rate, weight, any heart rhythm information, their current medicines, and a piece of paper and pen handy for any instructions they may receive the day of their visit  5. Inform patient they will receive a phone call 15 minutes prior to their appointment time (may be from unknown caller ID) so they should be prepared to answer   6. Confirm that appointment type is correct in Epic appointment notes (VIDEO vs PHONE)     TELEPHONE CALL NOTE  Carmen Ford has been deemed a candidate for a follow-up tele-health visit to limit community exposure during the Covid-19 pandemic. I spoke with the patient via phone to ensure availability of phone/video source, confirm preferred email & phone number, and discuss instructions and expectations.  I reminded Carmen Ford to be prepared with any vital sign and/or heart rhythm information that could potentially be obtained via home monitoring, at the time of her visit. I reminded Carmen Ford to expect a phone call at the time of her visit if her visit.  [name], RN 02/18/2019 8:57 AM   INSTRUCTIONS FOR DOWNLOADING THE WEBEX APP TO SMARTPHONE  - If Apple, ask patient to go to App Store and type in WebEx in the search bar. [place] [employer], the blue/green circle. If Android, go to [employer] and type in [employer] in the search bar. The app is free but as with any other app downloads, their phone may require them to verify saved payment information or Apple/Android password.  - The patient does NOT have to create an account. - On the day of the visit, the assist will walk the patient through joining the meeting with the meeting number/password.  INSTRUCTIONS FOR DOWNLOADING THE MYCHART APP TO SMARTPHONE  - The patient must first make sure to have  activated MyChart and know their login information - If Apple, go to [employer] and type in MyChart in the search bar and download the app. If Android, ask patient to go to [employer] and type in [place] in the search bar and download the app. The app is free but as with any other app downloads, their phone may require them to verify saved payment information or Apple/Android password.  - The patient will need to then log into the app with their MyChart username and password, and select [facility] as their  healthcare provider to link the account. When it is time for your visit, go to the MyChart app, find appointments, and click Begin Video Visit. Be sure to Select Allow for your device to access the Microphone and Camera for your visit. You will then be connected, and your provider will be with you shortly.  **If they have any issues connecting, or need assistance please contact MyChart service desk [phone])**  **If using a computer, in order to ensure the best quality for their visit they will need to use either of the following Internet Browsers: [employer], or Google Chrome**  IF USING DOXIMITY or DOXY.ME - The patient will receive a link just prior to their visit, either by text or email (to be determined day of appointment depending on if it's doxy.me or Doximity).     FULL LENGTH CONSENT FOR TELE-HEALTH VISIT   I hereby voluntarily request, consent and authorize [facility] and its employed or contracted physicians, physician assistants, nurse practitioners or other licensed health care professionals (the Practitioner), to provide me with telemedicine health care services (the "Services") as deemed necessary by the treating Practitioner. I acknowledge and consent to receive the Services by the Practitioner via telemedicine. I understand that the telemedicine visit will involve communicating with the Practitioner through live audiovisual communication technology and the disclosure of certain medical information by electronic transmission. I acknowledge that I have been given the opportunity to request an in-person assessment or other available alternative prior to the telemedicine visit and am voluntarily participating in the telemedicine visit.  I understand that I have the right to withhold or withdraw my consent to the use of telemedicine in the course of my care at any time, without affecting my right to future care or treatment, and that the Practitioner or I may  terminate the telemedicine visit at any time. I understand that I have the right to inspect all information obtained and/or recorded in the course of the telemedicine visit and may receive copies of available information for a reasonable fee.  I understand that some of the potential risks of receiving the Services via telemedicine include:  [name] Delay or interruption in medical evaluation due to technological equipment failure or disruption; . Information transmitted may not be sufficient (e.g. poor resolution of images) to allow for appropriate medical decision making by the Practitioner; and/or  . In rare instances, security protocols could fail, causing a breach of personal health information.  Furthermore, I acknowledge that it is my responsibility to provide information about my medical history, conditions and care that is complete and accurate to the best of my ability. I acknowledge that Practitioner's advice, recommendations, and/or decision may be based on factors not within their control, such as incomplete or inaccurate data provided by me or distortions of diagnostic images or specimens that may result from electronic transmissions. I understand that the practice of medicine is not an exact science and that  Practitioner makes no warranties or guarantees regarding treatment outcomes. I acknowledge that I will receive a copy of this consent concurrently upon execution via email to the email address I last provided but may also request a printed copy by calling the office of [facility].    I understand that my insurance will be billed for this visit.   I have read or had this consent read to me. . I understand the contents of this consent, which adequately explains the benefits and risks of the Services being provided via telemedicine.  . I have been provided ample opportunity to ask questions regarding this consent and the Services and have had my questions answered to my satisfaction. . I  give my informed consent for the services to be provided through the use of telemedicine in my medical care  By participating in this telemedicine visit I agree to the above.

## 2019-02-18 NOTE — Progress Notes (Signed)
Electrophysiology TeleHealth Note   Due to national recommendations of social distancing due to COVID 19, an audio/video telehealth visit is felt to be most appropriate for this patient at this time.  CV epic note from today for telehealth consent with [facility].  Date:  02/18/2019   ID:  Carmen Ford, DOB Apr 16, 1946, MRN [id number]  Location: patient's home  Provider location: [address] [place]  Evaluation Performed: Follow-up visit  PCP:  Carmen Beckwith, PA-C  Cardiologist: Carmen Ford Electrophysiologist:  Dr Carmen Ford  Chief Complaint: Atrial fibrillation  History of Present Illness:    Carmen Ford is a 73 y.o. female who presents via audio/video conferencing for a telehealth visit today.  Since last being seen in our clinic, the patient reports doing very well.  Today, she denies symptoms of palpitations, chest pain, shortness of breath,  lower extremity edema, dizziness, presyncope, or syncope.  The patient is otherwise without complaint today.  The patient denies symptoms of fevers, chills, cough, or new SOB worrisome for COVID 19.  She has a history of persistent atrial fibrillation, chronic diastolic heart failure, coronary artery disease, hypertension, hyperlipidemia, and stage III CKD.  She has had multiple hospitalizations with renal failure and hyperkalemia.  These have been associated with AV block.  She did subsequently have a hospitalization with some AV block that was not associated with hyperkalemia.  Today, denies symptoms of palpitations, chest pain, shortness of breath, orthopnea, PND, lower extremity edema, claudication, dizziness, presyncope, syncope, bleeding, or neurologic sequela. The patient is tolerating medications without difficulties.    Past Medical History:  Diagnosis Date   Anemia    Anemia in chronic kidney disease 10/15/2016   Bradycardia    Overview:  Not on a beta blocker with sinus bradycardia and Mobitz 1 second  degree AV block   Chronic diastolic heart failure ([place]) 02/26/2016   Overview:  Overview:  EF normal by echo 2013   Chronic ischemic heart disease    CKD (chronic kidney disease) 05/15/2017   Colitis, acute    Coronary artery disease    CABG  2011: LTA to LAD, SVG to M, SVG to PDA EF 40%   Diabetes mellitus without complication ([place])    type II    Essential (primary) hypertension 05/15/2017   Fatigue 10/15/2016   Hyperlipidemia 05/15/2017   Hypertensive heart disease with heart failure ([place])    Myocardial infarction ([place])    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   Restless legs syndrome 05/15/2017   Vitamin D deficiency 05/15/2017    Past Surgical History:  Procedure Laterality Date   ACHILLES TENDON SURGERY Left 2005   inserted a new tendon   CARDIAC SURGERY     CHOLECYSTECTOMY  02/2016   COLONOSCOPY     CORONARY ARTERY BYPASS GRAFT  2011   [facility]   KNEE ARTHROPLASTY Left 06/09/2017   Procedure: LEFT TOTAL KNEE ARTHROPLASTY WITH COMPUTER NAVIGATION;  Surgeon: Rod Can, MD;  Location: [facility];  Service: Orthopedics;  Laterality: Left;  NEED RNFA   PATELLAR TENDON REPAIR Left 07/03/2017   Procedure: PATELLA TENDON REPAIR LEFT KNEE;  Surgeon: Rod Can, MD;  Location: [facility];  Service: Orthopedics;  Laterality: Left;   VARICOSE VEIN SURGERY Bilateral 1990's    Current Outpatient Medications  Medication Sig Dispense Refill   amLODipine (NORVASC) 10 MG tablet Take 10 mg by mouth daily.      apixaban (ELIQUIS) 5 MG TABS tablet Take 1 tablet (5 mg total)  by mouth 2 (two) times daily. 60 tablet 3   Cholecalciferol (EQL VITAMIN D3) 2000 units CAPS Take 2,000 mg by mouth daily.     gabapentin (NEURONTIN) 400 MG capsule Take 400 mg by mouth 3 (three) times daily.      insulin glargine (LANTUS) 100 UNIT/ML injection Inject 15-25 Units into the skin at bedtime. Take 25 units if blood glucose is over 100 & take 15 units if blood glucose is lower than 100.      insulin lispro (HUMALOG) 100 UNIT/ML injection Inject 2-19 Units into the skin 3 (three) times daily with meals.      omega-3 acid ethyl esters (LOVAZA) 1 G capsule Take 2 g by mouth 2 (two) times daily.      rOPINIRole (REQUIP) 2 MG tablet Take 2 mg by mouth at bedtime.      No current facility-administered medications for this visit.     Allergies:   Penicillins and Statins   Social History:  The patient  reports that she has never smoked. She has never used smokeless tobacco. She reports that she does not drink alcohol or use drugs.   Family History:  The patient's  family history includes Cancer in her brother; Congestive Heart Failure in her father; Diabetes in her mother; Heart failure in her mother; Hypertension in her mother and son.   ROS:  Please see the history of present illness.   All other systems are personally reviewed and negative.    Exam:    Vital Signs:  There were no vitals taken for this visit.  Over the phone, no acute distress, not short of breath   Labs/Other Tests and Data Reviewed:    Recent Labs: 12/14/2018: ALT 10; BUN 23; Creatinine, Ser 1.22; NT-Pro BNP 2,254; Potassium 4.7; Sodium 140   Wt Readings from Last 3 Encounters:  12/18/18 231 lb (104.8 kg)  12/11/18 229 lb 9.6 oz (104.1 kg)  10/23/17 230 lb (104.3 kg)     Other studies personally reviewed: Additional studies/ records that were reviewed today include: ECG 12/11/2018 Review of the above records today demonstrates:   Atrial fibrillation, rate 79   ASSESSMENT & PLAN:    1.  Sick sinus syndrome: Also associated with complete heart block.  This is occurred when she is sick with renal failure as well as when she has not had a renal failure in the past.  At this point, I spoke with her primary cardiologist who feels that pacemaker implantation would be beneficial.  At this point, the patient has agreed to pacemaker implantation.  We Carmen Ford see her once the coronavirus pandemic has passed  and Carmen Ford likely plan for pacemaker at that point.  2.  Persistent atrial fibrillation: Currently on Eliquis.  She was in atrial fibrillation and February.  Julyan Ford likely need antiarrhythmic medications and cardioversion once pacemaker implant has been done.  This patients CHA2DS2-VASc Score and unadjusted Ischemic Stroke Rate (% per year) is equal to 4.8 % stroke rate/year from a score of 4  Above score calculated as 1 point each if present [CHF, HTN, DM, Vascular=MI/PAD/Aortic Plaque, Age if 65-74, or Female] Above score calculated as 2 points each if present [Age > 75, or Stroke/TIA/TE]    COVID 19 screen The patient denies symptoms of COVID 19 at this time.  The importance of social distancing was discussed today.  Follow-up: 3 months  Due to the fact that the patient does not have a smart phone, nor does she have access  to a computer, a video visit was not able to be established.  The visit was thus done over her landline as a telephone visit.  Current medicines are reviewed at length with the patient today.   The patient does not have concerns regarding her medicines.  The following changes were made today:  none  Labs/ tests ordered today include:  No orders of the defined types were placed in this encounter.    Patient Risk:  after full review of this patients clinical status, I feel that they are at moderate risk at this time.  Today, I have spent 12 minutes with the patient with telehealth technology discussing pneumonia, kidney failure, bradycardia.    Signed, Cheryl Stabenow Meredith Leeds, MD  02/18/2019 10:37 AM     [place] [id number] [phone] (office) [phone] (fax)

## 2019-04-20 HISTORY — DX: Morbid (severe) obesity due to excess calories: E66.01

## 2019-05-03 ENCOUNTER — Telehealth: Payer: Self-pay | Admitting: *Deleted

## 2019-05-03 MED ORDER — APIXABAN 5 MG PO TABS: 5.0000 mg | ORAL_TABLET | Freq: Two times a day (BID) | ORAL | 1 refills | Status: DC

## 2019-05-03 NOTE — Telephone Encounter (Signed)
Rx refill sent to pharmacy.  *STAT* If patient is at the pharmacy, call can be transferred to refill team.   1. Which medications need to be refilled? (please list name of each medication and dose if known) Eliquis 5 mg bid  2. Which pharmacy/location (including street and city if local pharmacy) is medication to be sent to?[facility] in [place]  3. Do they need a 30 day or 90 day supply? [place]

## 2019-05-04 ENCOUNTER — Ambulatory Visit: Payer: Medicare Other | Admitting: Cardiology

## 2019-05-24 NOTE — Progress Notes (Signed)
Cardiology Office Note:    Date:  05/25/2019   ID:  Carmen Ford, DOB [birth year], MRN [id number]  PCP:  Dellia Beckwith, PA-C  Cardiologist:  Shirlee More, MD    Referring MD: Dellia Beckwith, *    ASSESSMENT:    1. Persistent atrial fibrillation   2. Chronic anticoagulation   3. Chronic diastolic heart failure ([place])   4. Hypertensive heart disease with heart failure ([place])   5. Coronary artery disease involving native coronary artery of native heart with angina pectoris ([place])    PLAN:    In order of problems listed above:  1. Improved she is in sinus rhythm continue anticoagulant.  She has sick sinus syndrome she has had previous heart block in the setting of worsening renal function at this time I would not consider suppressive antiarrhythmic drug therapy without backup pacemaker.  She has arrangements to follow-up with EP.  Her last episode of high degree heart block was not associated with significant hyperkalemia. 2. Moderate stroke risk continue anticoagulant 3. Compensated continue her current diuretic 4. Stable hypertension continue treatment with none rate limiting calcium channel blocker and diuretic 5. Stable CAD continue medical treatment note she is intolerant of lipid-lowering medications.   Next appointment: 6 months   Medication Adjustments/Labs and Tests Ordered: Current medicines are reviewed at length with the patient today.  Concerns regarding medicines are outlined above.  Orders Placed This Encounter  Procedures  . EKG 12-Lead   No orders of the defined types were placed in this encounter.   Chief Complaint  Patient presents with  . Follow-up  . Congestive Heart Failure  . Atrial Fibrillation  . Anticoagulation    History of Present Illness:    Carmen Ford is a 73 y.o. female with a hx of  CAD CABG  heart failure stage 4 CKD hypertension hyperlipidemia and bradycardia second degree AV block with hyperkalemia  last seen 12/11/2018.[name]  Compliance with diet, lifestyle and medications: Yes  Overall is doing better no palpitation or syncope no repeat hospitalizations.  Weights are stable no orthopnea but short of breath when she walks for long distance hot humid weather no chest pain or TIA.  Tolerates her anticoagulant without bleeding complication Past Medical History:  Diagnosis Date  . Anemia   . Anemia in chronic kidney disease 10/15/2016  . Bradycardia    Overview:  Not on a beta blocker with sinus bradycardia and Mobitz 1 second degree AV block  . Chronic diastolic heart failure ([place]) 02/26/2016   Overview:  Overview:  EF normal by echo 2013  . Chronic ischemic heart disease   . CKD (chronic kidney disease) 05/15/2017  . Colitis, acute   . Coronary artery disease    CABG  2011: LTA to LAD, SVG to M, SVG to PDA EF 40%  . Diabetes mellitus without complication ([place])    type II   . Essential (primary) hypertension 05/15/2017  . Fatigue 10/15/2016  . Hyperlipidemia 05/15/2017  . Hypertensive heart disease with heart failure ([place])   . Myocardial infarction ([place])   . 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 Restless legs syndrome 05/15/2017  . Vitamin D deficiency 05/15/2017    Past Surgical History:  Procedure Laterality Date  . ACHILLES TENDON SURGERY Left 2005   inserted a new tendon  . CARDIAC SURGERY    . CATARACT EXTRACTION, BILATERAL    . CHOLECYSTECTOMY  02/2016  . COLONOSCOPY    . CORONARY ARTERY BYPASS GRAFT  2011   [facility]  . KNEE ARTHROPLASTY Left 06/09/2017   Procedure: LEFT TOTAL KNEE ARTHROPLASTY WITH COMPUTER NAVIGATION;  Surgeon: Rod Can, MD;  Location: [facility];  Service: Orthopedics;  Laterality: Left;  NEED RNFA  . PATELLAR TENDON REPAIR Left 07/03/2017   Procedure: PATELLA TENDON REPAIR LEFT KNEE;  Surgeon: Rod Can, MD;  [facility];  Service: Orthopedics;  Laterality: Left;  [name] VARICOSE VEIN SURGERY Bilateral 1990's    Current Medications: Current Meds  Medication Sig   . amLODipine (NORVASC) 10 MG tablet Take 10 mg by mouth daily.   [name] apixaban (ELIQUIS) 5 MG TABS tablet Take 1 tablet (5 mg total) by mouth 2 (two) times daily.  . Cholecalciferol (EQL VITAMIN D3) 2000 units CAPS Take 2,000 mg by mouth daily.  . furosemide (LASIX) 20 MG tablet Take 20 mg by mouth daily as needed.  . gabapentin (NEURONTIN) 400 MG capsule Take 400 mg by mouth 3 (three) times daily.   . insulin glargine (LANTUS) 100 UNIT/ML injection Inject 15-25 Units into the skin at bedtime. Take 25 units if blood glucose is over 100 & take 15 units if blood glucose is lower than 100.  . insulin lispro (HUMALOG) 100 UNIT/ML injection Inject 2-19 Units into the skin 3 (three) times daily with meals.   [name] omega-3 acid ethyl esters (LOVAZA) 1 G capsule Take 2 g by mouth 2 (two) times daily.   [name] rOPINIRole (REQUIP) 2 MG tablet Take 2 mg by mouth at bedtime.      Allergies:   Penicillins and Statins   Social History   Socioeconomic History  . Marital status: Married    Spouse name: Not on file  . Number of children: Not on file  . Years of education: Not on file  . Highest education level: Not on file  Occupational History  . Not on file  Social Needs  . Financial resource strain: Not on file  . Food insecurity    Worry: Not on file    Inability: Not on file  . Transportation needs    Medical: Not on file    Non-medical: Not on file  Tobacco Use  . Smoking status: Never Smoker  . Smokeless tobacco: Never Used  Substance and Sexual Activity  . Alcohol use: No    Alcohol/week: 0.0 standard drinks  . Drug use: No  . Sexual activity: Not on file  Lifestyle  . Physical activity    Days per week: Not on file    Minutes per session: Not on file  . Stress: Not on file  Relationships  . Social Herbalist on phone: Not on file    Gets together: Not on file    Attends religious service: Not on file    Active member of club or organization: Not on file    Attends meetings of  clubs or organizations: Not on file    Relationship status: Not on file  Other Topics Concern  . Not on file  Social History Narrative  . Not on file     Family History: The patient's family history includes Cancer in her brother; Congestive Heart Failure in her father; Diabetes in her mother; Heart failure in her mother; Hypertension in her mother and son. ROS:   Please see the history of present illness.    All other systems reviewed and are negative.  EKGs/Labs/Other Studies Reviewed:    The following studies were reviewed today:  EKG:  EKG ordered today and personally  reviewed.  The ekg ordered today demonstrates sinus rhythm first-degree heart block  Recent Labs: 04/20/2019 WBC 6200 BMP GFR 43 cc cholesterol 232 HDL 35 LDL 164   Physical Exam:    VS:  BP (!) 148/74 (BP Location: Left Arm, Patient Position: Sitting, Cuff Size: Large)   Pulse 63   Temp 97.8 F (36.6 C)   Ht 5\' 5"  (1.651 m)   Wt 240 lb (108.9 kg)   SpO2 97%   BMI 39.94 kg/m     Wt Readings from Last 3 Encounters:  05/25/19 240 lb (108.9 kg)  12/18/18 231 lb (104.8 kg)  12/11/18 229 lb 9.6 oz (104.1 kg)     GEN:  Well nourished, well developed in no acute distress HEENT: Normal NECK: No JVD; No carotid bruits LYMPHATICS: No lymphadenopathy CARDIAC: RRR, no murmurs, rubs, gallops RESPIRATORY:  Clear to auscultation without rales, wheezing or rhonchi  ABDOMEN: Soft, non-tender, non-distended MUSCULOSKELETAL:  No edema; No deformity  SKIN: Warm and dry NEUROLOGIC:  Alert and oriented x 3 PSYCHIATRIC:  Normal affect    Signed, Shirlee More, MD  05/25/2019 8:50 AM    [place]

## 2019-05-25 ENCOUNTER — Encounter: Payer: Self-pay | Admitting: Cardiology

## 2019-05-25 ENCOUNTER — Ambulatory Visit (INDEPENDENT_AMBULATORY_CARE_PROVIDER_SITE_OTHER): Payer: Medicare Other | Admitting: Cardiology

## 2019-05-25 ENCOUNTER — Other Ambulatory Visit: Payer: Self-pay

## 2019-05-25 VITALS — BP 148/74 | HR 63 | Temp 97.8°F | Ht 65.0 in | Wt 240.0 lb

## 2019-05-25 DIAGNOSIS — I4819 Other persistent atrial fibrillation: Secondary | ICD-10-CM | POA: Diagnosis not present

## 2019-05-25 DIAGNOSIS — I5032 Chronic diastolic (congestive) heart failure: Secondary | ICD-10-CM

## 2019-05-25 DIAGNOSIS — Z7901 Long term (current) use of anticoagulants: Secondary | ICD-10-CM

## 2019-05-25 DIAGNOSIS — I25119 Atherosclerotic heart disease of native coronary artery with unspecified angina pectoris: Secondary | ICD-10-CM | POA: Diagnosis not present

## 2019-05-25 DIAGNOSIS — I110 Hypertensive heart disease with heart failure: Secondary | ICD-10-CM

## 2019-05-25 NOTE — Patient Instructions (Addendum)
Medication Instructions:  Your physician recommends that you continue on your current medications as directed. Please refer to the Current Medication list given to you today. If you need a refill on your cardiac medications before your next appointment, please call your pharmacy.   Lab work: NONE If you have labs (blood work) drawn today and your tests are completely normal, you will receive your results only by: [name] MyChart Message (if you have MyChart) OR . A paper copy in the mail If you have any lab test that is abnormal or we need to change your treatment, we will call you to review the results.  Testing/Procedures: NONE  Follow-Up: At [facility], you and your health needs are our priority.  As part of our continuing mission to provide you with exceptional heart care, we have created designated Provider Care Teams.  These Care Teams include your primary Cardiologist (physician) and Advanced Practice Providers (APPs -  Physician Assistants and Nurse Practitioners) who all work together to provide you with the care you need, when you need it. . You will need a follow up appointment in 6 months.  Please call our office 2 months in advance to schedule this appointment.    Any Other Special Instructions Will Be Listed Below (If Applicable). NONE

## 2019-06-14 ENCOUNTER — Ambulatory Visit: Payer: Medicare Other | Admitting: Cardiology

## 2019-07-19 ENCOUNTER — Ambulatory Visit (INDEPENDENT_AMBULATORY_CARE_PROVIDER_SITE_OTHER): Payer: Medicare Other | Admitting: Cardiology

## 2019-07-19 ENCOUNTER — Other Ambulatory Visit: Payer: Self-pay

## 2019-07-19 ENCOUNTER — Encounter: Payer: Self-pay | Admitting: Cardiology

## 2019-07-19 VITALS — BP 144/64 | HR 67 | Ht 65.0 in | Wt 239.4 lb

## 2019-07-19 DIAGNOSIS — Z01812 Encounter for preprocedural laboratory examination: Secondary | ICD-10-CM

## 2019-07-19 DIAGNOSIS — I495 Sick sinus syndrome: Secondary | ICD-10-CM

## 2019-07-19 NOTE — Progress Notes (Signed)
Electrophysiology Office Note   Date:  07/19/2019   ID:  Carmen Ford, DOB Oct 01, 1946, MRN [id number]  PCP:  Dellia Beckwith, PA-C  Cardiologist:  Bettina Gavia Primary Electrophysiologist:  Will Meredith Leeds, MD    No chief complaint on file.    History of Present Illness: Carmen Ford is a 73 y.o. female who is being seen today for the evaluation of atrial fibrillation at the request of Dellia Beckwith, *. Presenting today for electrophysiology evaluation.  She has a history of persistent atrial fibrillation, chronic diastolic heart failure, coronary artery disease, hypertension, hyperlipidemia, and stage III CKD.  She was recently diagnosed with pneumonia and was hospitalized.  She was also in acute on chronic renal failure.  During her hospitalization, she had episodes of AV block.  She presented to her primary cardiologist office in atrial fibrillation.  She has had multiple episodes of AV block and bradycardia typically in the setting of acute renal failure and hyperkalemia.  Her most recent episode, she did not have hyperkalemia.  Today, denies symptoms of palpitations, chest pain, shortness of breath, orthopnea, PND, lower extremity edema, claudication, dizziness, presyncope, syncope, bleeding, or neurologic sequela. The patient is tolerating medications without difficulties.  She overall feels well.  She has no chest pain or shortness of breath.  She is able to do all of her daily activities.  She has not noted any bradycardia, though she has had documented bradycardia with complete heart block in the past.  Past Medical History:  Diagnosis Date  . Anemia   . Anemia in chronic kidney disease 10/15/2016  . Bradycardia    Overview:  Not on a beta blocker with sinus bradycardia and Mobitz 1 second degree AV block  . Chronic diastolic heart failure ([place]) 02/26/2016   Overview:  Overview:  EF normal by echo 2013  . Chronic ischemic heart disease   . CKD (chronic kidney disease)  05/15/2017  . Colitis, acute   . Coronary artery disease    CABG  2011: LTA to LAD, SVG to M, SVG to PDA EF 40%  . Diabetes mellitus without complication ([place])    type II   . Essential (primary) hypertension 05/15/2017  . Fatigue 10/15/2016  . Hyperlipidemia 05/15/2017  . Hypertensive heart disease with heart failure ([place])   . Myocardial infarction ([place])   . 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 Restless legs syndrome 05/15/2017  . Vitamin D deficiency 05/15/2017   Past Surgical History:  Procedure Laterality Date  . ACHILLES TENDON SURGERY Left 2005   inserted a new tendon  . CARDIAC SURGERY    . CATARACT EXTRACTION, BILATERAL    . CHOLECYSTECTOMY  02/2016  . COLONOSCOPY    . CORONARY ARTERY BYPASS GRAFT  2011   [facility]  . KNEE ARTHROPLASTY Left 06/09/2017   Procedure: LEFT TOTAL KNEE ARTHROPLASTY WITH COMPUTER NAVIGATION;  Surgeon: Rod Can, MD;  Location: [facility];  Service: Orthopedics;  Laterality: Left;  NEED RNFA  . PATELLAR TENDON REPAIR Left 07/03/2017   Procedure: PATELLA TENDON REPAIR LEFT KNEE;  Surgeon: Rod Can, MD;  Location: [facility];  Service: Orthopedics;  Laterality: Left;  [name] VARICOSE VEIN SURGERY Bilateral 1990's     Current Outpatient Medications  Medication Sig Dispense Refill  . amLODipine (NORVASC) 10 MG tablet Take 10 mg by mouth daily.     [name] apixaban (ELIQUIS) 5 MG TABS tablet Take 1 tablet (5 mg total) by mouth 2 (two) times daily. 180 tablet 1  . Cholecalciferol (EQL  VITAMIN D3) 2000 units CAPS Take 2,000 mg by mouth daily.    . furosemide (LASIX) 20 MG tablet Take 20 mg by mouth daily as needed.    . gabapentin (NEURONTIN) 400 MG capsule Take 400 mg by mouth 3 (three) times daily.     . insulin glargine (LANTUS) 100 UNIT/ML injection Inject 15-25 Units into the skin at bedtime. Take 25 units if blood glucose is over 100 & take 15 units if blood glucose is lower than 100.    . insulin lispro (HUMALOG) 100 UNIT/ML injection Inject 2-19 Units  into the skin 3 (three) times daily with meals.     [name] omega-3 acid ethyl esters (LOVAZA) 1 G capsule Take 2 g by mouth 2 (two) times daily.     [name] rOPINIRole (REQUIP) 2 MG tablet Take 2 mg by mouth at bedtime.      No current facility-administered medications for this visit.     Allergies:   Penicillins and Statins   Social History:  The patient  reports that she has never smoked. She has never used smokeless tobacco. She reports that she does not drink alcohol or use drugs.   Family History:  The patient's family history includes Cancer in her brother; Congestive Heart Failure in her father; Diabetes in her mother; Heart failure in her mother; Hypertension in her mother and son.    ROS:  Please see the history of present illness.   Otherwise, review of systems is positive for none.   All other systems are reviewed and negative.   PHYSICAL EXAM: VS:  BP (!) 144/64   Pulse 67   Ht 5\' 5"  (1.651 m)   Wt 239 lb 7.2 oz (108.6 kg)   SpO2 95%   BMI 39.85 kg/m  , BMI Body mass index is 39.85 kg/m. GEN: Well nourished, well developed, in no acute distress  HEENT: normal  Neck: no JVD, carotid bruits, or masses Cardiac: RRR; no murmurs, rubs, or gallops,no edema  Respiratory:  clear to auscultation bilaterally, normal work of breathing GI: soft, nontender, nondistended, + BS MS: no deformity or atrophy  Skin: warm and dry Neuro:  Strength and sensation are intact Psych: euthymic mood, full affect  EKG:  EKG is ordered today. Personal review of the ekg ordered shows SR, 1dAVB   Recent Labs: 12/14/2018: ALT 10; BUN 23; Creatinine, Ser 1.22; NT-Pro BNP 2,254; Potassium 4.7; Sodium 140    Lipid Panel  No results found for: CHOL, TRIG, HDL, CHOLHDL, VLDL, LDLCALC, LDLDIRECT   Wt Readings from Last 3 Encounters:  07/19/19 239 lb 7.2 oz (108.6 kg)  05/25/19 240 lb (108.9 kg)  12/18/18 231 lb (104.8 kg)      Other studies Reviewed: Additional studies/ records that were reviewed  today include: Epic notes   ASSESSMENT AND PLAN:  1.  Sick sinus syndrome with intermittent complete heart block: Has occurred with acute renal failure, but also without episodes of hypokalemia.  We will plan for pacemaker implant.  Risks and benefits were discussed and include bleeding, tamponade, infection, pneumothorax.  She understands these risks and is agreed to the procedure.    2.  Persistent atrial fibrillation: Currently on Eliquis.  In sinus rhythm today.  This patients CHA2DS2-VASc Score and unadjusted Ischemic Stroke Rate (% per year) is equal to 4.8 % stroke rate/year from a score of 4  Above score calculated as 1 point each if present [CHF, HTN, DM, Vascular=MI/PAD/Aortic Plaque, Age if 68-74, or Female] Above score  calculated as 2 points each if present [Age > 75, or Stroke/TIA/TE]      Current medicines are reviewed at length with the patient today.   The patient does not have concerns regarding her medicines.  The following changes were made today:  none  Labs/ tests ordered today include:  Orders Placed This Encounter  Procedures  . EKG 12-Lead     Disposition:   FU with Will Camnitz 3 months  Signed, Will Meredith Leeds, MD  07/19/2019 9:30 AM     [facility] [address] [place] [address] [phone] (office) [phone] (fax)

## 2019-07-19 NOTE — Patient Instructions (Signed)
Medication Instructions:  Your physician recommends that you continue on your current medications as directed. Please refer to the Current Medication list given to you today.     * If you need a refill on your cardiac medications before your next appointment, please call your pharmacy. 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 A pacemaker is a small device that is placed under the skin of your chest or abdomen to help control abnormal heart rhythms. This device uses electrical pulses to prompt the heart to beat at a normal rate. Pacemakers are used to treat heart rhythms that are too slow. Wire (leads) are attached to the pacemaker that goes into the chambers of you heart. This is done in the hospital and usually requires and overnight stay. Please follow the instructions below, located under the special instructions section.   Follow-Up: Your physician recommends that you schedule a wound check appointment 10-14 days, after your procedure on 08/10/19, with the/ /device clinic. --  This appt is scheduled for 08/24/19 @ 10:30 am at the [place] office at [address], [place] recommends that you schedule a follow up appointment in 91 days, after your procedure on 08/10/19, with Dr. Curt Bears.  * Please note that any paperwork needing to be filled out by the provider will need to be addressed at the front desk prior to seeing the provider.  Please note that any FMLA, disability or other documents regarding health condition is subject to a $25.00 charge that must be received prior to completion of paperwork in the form of a money order or check. *  Thank you for choosing [facility]!!   Trinidad Curet, RN [phone]   Any Other Special Instructions Will Be Listed Below (If Applicable).      Implantable Device Instructions  You are scheduled for: Pacemaker implant on 08/10/19 with Dr. Curt Bears.  1.   Pre procedure testing-             A.  LAB WORK--- On 07/19/19.  You do not need to be fasting.               B. COVID TEST-- On 08/07/19 @ 11:10 am - You will go to [facility] hospital ([place]) for your Covid testing.   This is a drive thru test site.  There will be multiple testing areas.  Be sure to share with the first checkpoint that you are there for pre-procedure/surgery testing. This will put you into the right (yellow) lane that leads to the PAT testing team.   Stay in your car and the nurse team will come to your car to test you.  After you are tested please go home and self quarantine until the day of your procedure.    2. On the day of your procedure 08/10/19 you will go to [facility] [phone]) at 10:30 am.  Dennis Bast will go to the main entrance [address] [employer]) and enter where the [employer] are.  You will check in at ADMITTING.  You may have one support person come in to the hospital with you.  They will be asked to wait in the waiting room.   3.   Take 1/2 your usual dose of bedtime insulin the night before this  procedure.  Do not eat or drink after midnight prior to your procedure.   4.   On the morning of your procedure do NOT take any medication.  5.  The night before your procedure and the morning of your procedure scrub your neck/chest with surgical scrub.  An instruction letter is included with this letter.    5.  Plan for an overnight stay.  If you use your phone frequently bring your phone charger.  When you are discharged you will need someone to drive you home.   6.  You will follow up with the [place] clinic 10-14 days after your procedure. You will follow up with Dr. Curt Bears 91 days after your procedure.  These appointments will be made for you.   * If you have ANY questions after you get home, please  call the office [phone] and ask for Amdrew Oboyle RN or send a MyChart message.     [place] - Preparing For Surgery  Before surgery, you can play an important role. Because skin is not sterile, your skin needs to be as free of germs as possible. You can reduce the number of germs on your skin by washing with CHG (chlorahexidine gluconate) Soap before surgery.  CHG is an antiseptic cleaner which kills germs and bonds with the skin to continue killing germs even after washing.   Please do not use if you have an allergy to CHG or antibacterial soaps.  If your skin becomes reddened/irritated stop using the CHG.   Do not shave (including legs and underarms) for at least 48 hours prior to first CHG shower.  It is OK to shave your face.  Please follow these instructions carefully:  1.  Shower the night before surgery and the morning of surgery with CHG.  2.  If you choose to wash your hair, wash your hair first as usual with your normal shampoo.  3.  After you shampoo, rinse your hair and body thoroughly to remove the shampoo.  4.  Use CHG as you would any other liquid soap.  You can apply CHG directly to the skin and wash gently with a clean washcloth. 5.  Apply the CHG Soap to your body ONLY FROM THE NECK DOWN.  Do not use on open wounds or open sores.  Avoid contact with your eyes, ears, mouth and genitals (private parts).  Wash genitals (private parts) with your normal soap.  6.  Wash thoroughly, paying special attention to the area where your surgery will be performed.  7.  Thoroughly rinse your body with warm water from the neck down.   8.  DO NOT shower/wash with your normal soap after using and rinsing off the CHG soap.  9.  Pat yourself dry with a clean towel.           10.  Wear clean pajamas.           11.  Place clean sheets on your bed the night of your first shower and do not sleep with pets.  Day of Surgery: Do not apply any deodorants/lotions.  Please wear clean clothes to the  hospital/surgery center.     Pacemaker Implantation, Adult Pacemaker implantation is a procedure to place a pacemaker inside your chest. A pacemaker is a small computer that sends electrical signals to the heart and helps your heart beat normally. A pacemaker also stores information about your heart rhythms. You may need pacemaker implantation if you:  Have a slow  heartbeat (bradycardia).  Faint (syncope).  Have shortness of breath (dyspnea) due to heart problems.  The pacemaker attaches to your heart through a wire, called a lead. Sometimes just one lead is needed. Other times, there will be two leads. There are two types of pacemakers:  Transvenous pacemaker. This type is placed under the skin or muscle of your chest. The lead goes through a vein in the chest area to reach the inside of the heart.  Epicardial pacemaker. This type is placed under the skin or muscle of your chest or belly. The lead goes through your chest to the outside of the heart.  Tell a health care provider about:  Any allergies you have.  All medicines you are taking, including vitamins, herbs, eye drops, creams, and over-the-counter medicines.  Any problems you or family members have had with anesthetic medicines.  Any blood or bone disorders you have.  Any surgeries you have had.  Any medical conditions you have.  Whether you are pregnant or may be pregnant. What are the risks? Generally, this is a safe procedure. However, problems may occur, including:  Infection.  Bleeding.  Failure of the pacemaker or the lead.  Collapse of a lung or bleeding into a lung.  Blood clot inside a blood vessel with a lead.  Damage to the heart.  Infection inside the heart (endocarditis).  Allergic reactions to medicines.  What happens before the procedure? Staying hydrated Follow instructions from your health care provider about hydration, which may include:  Up to 2 hours before the procedure - you may  continue to drink clear liquids, such as water, clear fruit juice, black coffee, and plain tea.  Eating and drinking restrictions Follow instructions from your health care provider about eating and drinking, which may include:  8 hours before the procedure - stop eating heavy meals or foods such as meat, fried foods, or fatty foods.  6 hours before the procedure - stop eating light meals or foods, such as toast or cereal.  6 hours before the procedure - stop drinking milk or drinks that contain milk.  2 hours before the procedure - stop drinking clear liquids.  Medicines  Ask your health care provider about: ? Changing or stopping your regular medicines. This is especially important if you are taking diabetes medicines or blood thinners. ? Taking medicines such as aspirin and ibuprofen. These medicines can thin your blood. Do not take these medicines before your procedure if your health care provider instructs you not to.  You may be given antibiotic medicine to help prevent infection. General instructions  You will have a heart evaluation. This may include an electrocardiogram (ECG), chest X-ray, and heart imaging (echocardiogram,  or echo) tests.  You will have blood tests.  Do not use any products that contain nicotine or tobacco, such as cigarettes and e-cigarettes. If you need help quitting, ask your health care provider.  Plan to have someone take you home from the hospital or clinic.  If you will be going home right after the procedure, plan to have someone with you for 24 hours.  Ask your health care provider how your surgical site will be marked or identified. What happens during the procedure?  To reduce your risk of infection: ? Your health care team will wash or sanitize their hands. ? Your skin will be washed with soap. ? Hair may be removed from the surgical area.  An IV tube will be inserted into one of your veins.  You will be given one or more of the  following: ? A medicine to help you relax (sedative). ? A medicine to numb the area (local anesthetic). ? A medicine to make you fall asleep (general anesthetic).  If you are getting a transvenous pacemaker: ? An incision will be made in your upper chest. ? A pocket will be made for the pacemaker. It may be placed under the skin or between layers of muscle. ? The lead will be inserted into a blood vessel that returns to the heart. ? While X-rays are taken by an imaging machine (fluoroscopy), the lead will be advanced through the vein to the inside of your heart. ? The other end of the lead will be tunneled under the skin and attached to the pacemaker.  If you are getting an epicardial pacemaker: ? An incision will be made near your ribs or breastbone (sternum) for the lead. ? The lead will be attached to the outside of your heart. ? Another incision will be made in your chest or upper belly to create a pocket for the pacemaker. ? The free end of the lead will be tunneled under the skin and attached to the pacemaker.  The transvenous or epicardial pacemaker will be tested. Imaging studies may be done to check the lead position.  The incisions will be closed with stitches (sutures), adhesive strips, or skin glue.  Bandages (dressing) will be placed over the incisions. The procedure may vary among health care providers and hospitals. What happens after the procedure?  Your blood pressure, heart rate, breathing rate, and blood oxygen level will be monitored until the medicines you were given have worn off.  You will be given antibiotics and pain medicine.  ECG and chest x-rays will be done.  You will wear a continuous type of ECG (Holter monitor) to check your heart rhythm.  Your health care provider will program the pacemaker.  Do not drive for 24 hours if you received a sedative. This information is not intended to replace advice given to you by your health care provider. Make sure  you discuss any questions you have with your health care provider. Document Released: 10/11/2002 Document Revised: 05/10/2016 Document Reviewed: 04/03/2016 Elsevier Interactive Patient Education  2018 [employer].     Pacemaker Implantation, Adult, Care After This sheet gives you information about how to care for yourself after your procedure. Your health care provider may also give you more specific instructions. If you have problems or questions, contact your health care provider. What can I expect after the procedure? After the procedure, it is common to have:  Mild pain.  Slight bruising.  Some swelling over the incision.  A slight bump over the skin where the device was placed. Sometimes, it is possible to feel the device under the skin. This is normal.  Follow these instructions at home: Medicines  Take over-the-counter and prescription medicines only as told by your health care provider.  If you were prescribed an antibiotic medicine, take it as told by your health care provider. Do not stop taking the antibiotic even if you start to feel better. Wound care  Do not remove the bandage on your chest until directed to do so by your health care provider.  After your bandage is removed, you may see pieces of tape called skin adhesive strips over the area where the cut was made (incision site). Let them fall off on their own.  Check the incision site every day to make  sure it is not infected, bleeding, or starting to pull apart.  Do not use lotions or ointments near the incision site unless directed to do so.  Keep the incision area clean and dry for 2-3 days after the procedure or as directed by your health care provider. It takes several weeks for the incision site to completely heal.  Do not take baths, swim, or use a hot tub for 7-10 days or as otherwise directed by your health care provider. Activity  Do not drive or use heavy machinery while taking prescription pain  medicine.  Do not drive for 24 hours if you were given a medicine to help you relax (sedative).  Check with your health care provider before you start to drive or play sports.  Avoid sudden jerking, pulling, or chopping movements that pull your upper arm far away from your body. Avoid these movements for at least 6 weeks or as long as told by your health care provider.  Do not lift your upper arm above your shoulders for at least 6 weeks or as long as told by your health care provider. This means no tennis, golf, or swimming.  You may go back to work when your health care provider says it is okay. Pacemaker care  You may be shown how to transfer data from your pacemaker through the phone to your health care provider.  Always let all health care providers know about your pacemaker before you have any medical procedures or tests.  Wear a medical ID bracelet or necklace stating that you have a pacemaker. Carry a pacemaker ID card with you at all times.  Your pacemaker battery will last for 5-15 years. Routine checks by your health care provider will let the health care provider know when the battery is starting to run down. The pacemaker will need to be replaced when the battery starts to run down.  Do not use amateur Chief of [employer]. Other electrical devices are safe to use, including power tools, lawn mowers, and speakers. If you are unsure of whether something is safe to use, ask your health care provider.  When using your cell phone, hold it to the ear opposite the pacemaker. Do not leave your cell phone in a pocket over the pacemaker.  Avoid places or objects that have a strong electric or magnetic field, including: ? [employer]. When at the airport, let officials know that you have a pacemaker. ? Power plants. ? Large electrical generators. ? Radiofrequency transmission towers, such as cell phone and radio towers. General instructions  Weigh  yourself every day. If you suddenly gain weight, fluid may be building up in your body.  Keep all follow-up visits as told by your health care provider. This is important. Contact a health care provider if:  You gain weight suddenly.  Your legs or feet swell.  It feels like your heart is fluttering or skipping beats (heart palpitations).  You have chills or a fever.  You have more redness, swelling, or pain around your incisions.  You have more fluid or blood coming from your incisions.  Your incisions feel warm to the touch.  You have pus or a bad smell coming from your incisions. Get help right away if:  You have chest pain.  You have trouble breathing or are short of breath.  You become extremely tired.  You are light-headed or you faint. This information is not intended to replace advice given to you by your  health care provider. Make sure you discuss any questions you have with your health care provider. Document Released: 05/10/2005 Document Revised: 08/02/2016 Document Reviewed: 08/02/2016 Elsevier Interactive Patient Education  2018 [employer] Discharge Instructions for  Pacemaker/Defibrillator Patients  ACTIVITY No heavy lifting or vigorous activity with your left/right arm for 6 to 8 weeks.  Do not raise your left/right arm above your head for one week.  Gradually raise your affected arm as drawn below.           __  NO DRIVING for     ; you may begin driving on     .  WOUND CARE - Keep the wound area clean and dry.  Do not get this area wet for one week. No showers for one week; you may shower on     . - The tape/steri-strips on your wound will fall off; do not pull them off.  No bandage is needed on the site.  DO  NOT apply any creams, oils, or ointments to the wound area. - If you notice any drainage or discharge from the wound, any swelling or bruising at the site, or you develop a fever > 101? F after you are discharged home, call  the office at once.  SPECIAL INSTRUCTIONS - You are still able to use cellular telephones; use the ear opposite the side where you have your pacemaker/defibrillator.  Avoid carrying your cellular phone near your device. - When traveling through airports, show security personnel your identification card to avoid being screened in the metal detectors.  Ask the security personnel to use the hand wand. - Avoid arc welding equipment, MRI testing (magnetic resonance imaging), TENS units (transcutaneous nerve stimulators).  Call the office for questions about other devices. - Avoid electrical appliances that are in poor condition or are not properly grounded. - Microwave ovens are safe to be near or to operate.  ADDITIONAL INFORMATION FOR DEFIBRILLATOR PATIENTS SHOULD YOUR DEVICE GO OFF: - If your device goes off ONCE and you feel fine afterward, notify the device clinic nurses. - If your device goes off ONCE and you do not feel well afterward, call 911. - If your device goes off TWICE, call 911. - If your device goes off [place], call 911.  DO NOT DRIVE YOURSELF OR A FAMILY MEMBER WITH A DEFIBRILLATOR TO THE HOSPITAL-CALL 911.

## 2019-07-19 NOTE — Addendum Note (Signed)
Addended by: [name] on: 07/19/2019 09:53 AM   Modules accepted: Orders

## 2019-07-20 LAB — BASIC METABOLIC PANEL
BUN/Creatinine Ratio: 20 (ref 12–28)
BUN: 32 mg/dL — ABNORMAL HIGH (ref 8–27)
CO2: 23 mmol/L (ref 20–29)
Calcium: 8.8 mg/dL (ref 8.7–10.3)
Chloride: 102 mmol/L (ref 96–106)
Creatinine, Ser: 1.57 mg/dL — ABNORMAL HIGH (ref 0.57–1.00)
GFR calc Af Amer: 37 mL/min/{1.73_m2} — ABNORMAL LOW (ref >59)
GFR calc non Af Amer: 32 mL/min/{1.73_m2} — ABNORMAL LOW (ref >59)
Glucose: 131 mg/dL — ABNORMAL HIGH (ref 65–99)
Potassium: 4.6 mmol/L (ref 3.5–5.2)
Sodium: 142 mmol/L (ref 134–144)

## 2019-07-20 LAB — CBC
Hematocrit: 33.8 % — ABNORMAL LOW (ref 34.0–46.6)
Hemoglobin: 10.9 g/dL — ABNORMAL LOW (ref 11.1–15.9)
MCH: 27.6 pg (ref 26.6–33.0)
MCHC: 32.2 g/dL (ref 31.5–35.7)
MCV: 86 fL (ref 79–97)
Platelets: 249 10*3/uL (ref 150–450)
RBC: 3.95 x10E6/uL (ref 3.77–5.28)
RDW: 14.1 % (ref 11.7–15.4)
WBC: 7.4 10*3/uL (ref 3.4–10.8)

## 2019-08-07 ENCOUNTER — Other Ambulatory Visit (HOSPITAL_COMMUNITY)
Admission: RE | Admit: 2019-08-07 | Discharge: 2019-08-07 | Disposition: A | Discharge status: Home / Self Care | Payer: Medicare Other | Source: Ambulatory Visit | Attending: Cardiology | Admitting: Cardiology

## 2019-08-07 DIAGNOSIS — Z01812 Encounter for preprocedural laboratory examination: Secondary | ICD-10-CM | POA: Diagnosis present

## 2019-08-07 DIAGNOSIS — Z20828 Contact with and (suspected) exposure to other viral communicable diseases: Secondary | ICD-10-CM | POA: Diagnosis not present

## 2019-08-09 ENCOUNTER — Telehealth: Payer: Self-pay | Admitting: Cardiology

## 2019-08-09 LAB — NOVEL CORONAVIRUS, NAA (HOSP ORDER, SEND-OUT TO REF LAB; TAT 18-24 HRS): SARS-CoV-2, NAA: NOT DETECTED

## 2019-08-09 NOTE — Telephone Encounter (Signed)
Please advise. Thanks.  

## 2019-08-09 NOTE — Telephone Encounter (Signed)
Patient called and wants to talk to [facility] and decide if she really needs the pacemaker she is to have tomorrow by Dr. Curt Bears.

## 2019-08-10 ENCOUNTER — Other Ambulatory Visit: Payer: Self-pay

## 2019-08-10 ENCOUNTER — Encounter (HOSPITAL_COMMUNITY)
Admission: RE | Disposition: A | Discharge status: Home / Self Care | Payer: Self-pay | Source: Home / Self Care | Attending: Cardiology

## 2019-08-10 ENCOUNTER — Encounter (HOSPITAL_COMMUNITY): Payer: Self-pay | Admitting: *Deleted

## 2019-08-10 ENCOUNTER — Ambulatory Visit (HOSPITAL_COMMUNITY)
Admission: RE | Admit: 2019-08-10 | Discharge: 2019-08-11 | Disposition: A | Discharge status: Home / Self Care | Payer: Medicare Other | Attending: Cardiology | Admitting: Cardiology

## 2019-08-10 DIAGNOSIS — I495 Sick sinus syndrome: Secondary | ICD-10-CM

## 2019-08-10 DIAGNOSIS — Z79899 Other long term (current) drug therapy: Secondary | ICD-10-CM | POA: Diagnosis not present

## 2019-08-10 DIAGNOSIS — D631 Anemia in chronic kidney disease: Secondary | ICD-10-CM | POA: Insufficient documentation

## 2019-08-10 DIAGNOSIS — Z950 Presence of cardiac pacemaker: Secondary | ICD-10-CM | POA: Diagnosis not present

## 2019-08-10 DIAGNOSIS — I130 Hypertensive heart and chronic kidney disease with heart failure and stage 1 through stage 4 chronic kidney disease, or unspecified chronic kidney disease: Secondary | ICD-10-CM | POA: Diagnosis not present

## 2019-08-10 DIAGNOSIS — Z880 Allergy status to penicillin: Secondary | ICD-10-CM | POA: Diagnosis not present

## 2019-08-10 DIAGNOSIS — Z7901 Long term (current) use of anticoagulants: Secondary | ICD-10-CM | POA: Diagnosis not present

## 2019-08-10 DIAGNOSIS — N183 Chronic kidney disease, stage 3 unspecified: Secondary | ICD-10-CM | POA: Diagnosis not present

## 2019-08-10 DIAGNOSIS — I5032 Chronic diastolic (congestive) heart failure: Secondary | ICD-10-CM | POA: Insufficient documentation

## 2019-08-10 DIAGNOSIS — I252 Old myocardial infarction: Secondary | ICD-10-CM | POA: Insufficient documentation

## 2019-08-10 DIAGNOSIS — I4819 Other persistent atrial fibrillation: Secondary | ICD-10-CM | POA: Diagnosis not present

## 2019-08-10 DIAGNOSIS — Z888 Allergy status to other drugs, medicaments and biological substances status: Secondary | ICD-10-CM | POA: Diagnosis not present

## 2019-08-10 DIAGNOSIS — E1122 Type 2 diabetes mellitus with diabetic chronic kidney disease: Secondary | ICD-10-CM | POA: Diagnosis not present

## 2019-08-10 DIAGNOSIS — E785 Hyperlipidemia, unspecified: Secondary | ICD-10-CM | POA: Diagnosis not present

## 2019-08-10 DIAGNOSIS — I2510 Atherosclerotic heart disease of native coronary artery without angina pectoris: Secondary | ICD-10-CM | POA: Insufficient documentation

## 2019-08-10 DIAGNOSIS — Z95818 Presence of other cardiac implants and grafts: Secondary | ICD-10-CM

## 2019-08-10 DIAGNOSIS — Z794 Long term (current) use of insulin: Secondary | ICD-10-CM | POA: Diagnosis not present

## 2019-08-10 HISTORY — PX: PACEMAKER IMPLANT: EP1218

## 2019-08-10 HISTORY — DX: Sick sinus syndrome: I49.5

## 2019-08-10 LAB — GLUCOSE, CAPILLARY
Glucose-Capillary: 92 mg/dL (ref 70–99)
Glucose-Capillary: 134 mg/dL — ABNORMAL HIGH (ref 70–99)
Glucose-Capillary: 74 mg/dL (ref 70–99)
Glucose-Capillary: 92 mg/dL (ref 70–99)
Glucose-Capillary: 189 mg/dL — ABNORMAL HIGH (ref 70–99)

## 2019-08-10 LAB — SURGICAL PCR SCREEN
MRSA, PCR: NEGATIVE
Staphylococcus aureus: NEGATIVE

## 2019-08-10 SURGERY — PACEMAKER IMPLANT

## 2019-08-10 MED ORDER — FUROSEMIDE 20 MG PO TABS: 20.0000 mg | ORAL_TABLET | Freq: Every day | ORAL | Status: DC | PRN

## 2019-08-10 MED ORDER — DEXTROSE 50 % IV SOLN
INTRAVENOUS | Status: AC
  Filled 2019-08-10: qty 50

## 2019-08-10 MED ORDER — MIDAZOLAM HCL 5 MG/5ML IJ SOLN
INTRAMUSCULAR | Status: AC
  Filled 2019-08-10: qty 5

## 2019-08-10 MED ORDER — HEPARIN (PORCINE) IN NACL 1000-0.9 UT/500ML-% IV SOLN
INTRAVENOUS | Status: DC | PRN
  Administered 2019-08-10: 500 mL

## 2019-08-10 MED ORDER — ONDANSETRON HCL 4 MG/2ML IJ SOLN: 4.0000 mg | Freq: Four times a day (QID) | INTRAMUSCULAR | Status: DC | PRN

## 2019-08-10 MED ORDER — DEXTROSE 50 % IV SOLN
25.0000 mL | Freq: Once | INTRAVENOUS | Status: AC
  Administered 2019-08-10: 14:00:00 25 mL via INTRAVENOUS

## 2019-08-10 MED ORDER — LIDOCAINE HCL (PF) 1 % IJ SOLN
INTRAMUSCULAR | Status: AC
  Filled 2019-08-10: qty 30

## 2019-08-10 MED ORDER — SODIUM CHLORIDE 0.9 % IV SOLN
80.0000 mg | INTRAVENOUS | Status: AC
  Administered 2019-08-10: 16:00:00 80 mg

## 2019-08-10 MED ORDER — ACETAMINOPHEN 325 MG PO TABS
325.0000 mg | ORAL_TABLET | ORAL | Status: DC | PRN
  Filled 2019-08-10: qty 2

## 2019-08-10 MED ORDER — VANCOMYCIN HCL IN DEXTROSE 1-5 GM/200ML-% IV SOLN
1000.0000 mg | Freq: Two times a day (BID) | INTRAVENOUS | Status: AC
  Administered 2019-08-11: 1000 mg via INTRAVENOUS
  Filled 2019-08-10: qty 200

## 2019-08-10 MED ORDER — MIDAZOLAM HCL 5 MG/5ML IJ SOLN
INTRAMUSCULAR | Status: DC | PRN
  Administered 2019-08-10 (×3): 1 mg via INTRAVENOUS

## 2019-08-10 MED ORDER — VANCOMYCIN HCL 10 G IV SOLR
1500.0000 mg | INTRAVENOUS | Status: AC
  Administered 2019-08-10: 15:00:00 1500 mg via INTRAVENOUS
  Filled 2019-08-10 (×2): qty 1500

## 2019-08-10 MED ORDER — FENTANYL CITRATE (PF) 100 MCG/2ML IJ SOLN
INTRAMUSCULAR | Status: DC | PRN
  Administered 2019-08-10 (×3): 12.5 ug via INTRAVENOUS

## 2019-08-10 MED ORDER — AMLODIPINE BESYLATE 10 MG PO TABS: 10.0000 mg | ORAL_TABLET | Freq: Every day | ORAL | Status: DC

## 2019-08-10 MED ORDER — LIDOCAINE HCL (PF) 1 % IJ SOLN
INTRAMUSCULAR | Status: DC | PRN
  Administered 2019-08-10: 60 mL via SUBCUTANEOUS

## 2019-08-10 MED ORDER — SODIUM CHLORIDE 0.9 % IV SOLN
INTRAVENOUS | Status: DC
  Administered 2019-08-10: 11:00:00 via INTRAVENOUS

## 2019-08-10 MED ORDER — MUPIROCIN 2 % EX OINT
TOPICAL_OINTMENT | CUTANEOUS | Status: AC
  Administered 2019-08-10: 11:00:00
  Filled 2019-08-10: qty 22

## 2019-08-10 MED ORDER — INSULIN GLARGINE 100 UNIT/ML ~~LOC~~ SOLN
9.0000 [IU] | Freq: Two times a day (BID) | SUBCUTANEOUS | Status: DC
  Filled 2019-08-10 (×3): qty 0.09

## 2019-08-10 MED ORDER — ROPINIROLE HCL 1 MG PO TABS
2.0000 mg | ORAL_TABLET | Freq: Every day | ORAL | Status: DC
  Administered 2019-08-10: 23:00:00 2 mg via ORAL
  Filled 2019-08-10 (×2): qty 2

## 2019-08-10 MED ORDER — GABAPENTIN 400 MG PO CAPS
400.0000 mg | ORAL_CAPSULE | Freq: Three times a day (TID) | ORAL | Status: DC
  Administered 2019-08-10: 22:00:00 400 mg via ORAL
  Filled 2019-08-10: qty 1

## 2019-08-10 MED ORDER — INSULIN GLARGINE 100 UNIT/ML ~~LOC~~ SOLN
9.0000 [IU] | Freq: Two times a day (BID) | SUBCUTANEOUS | Status: DC
  Filled 2019-08-10: qty 0.09

## 2019-08-10 MED ORDER — FENTANYL CITRATE (PF) 100 MCG/2ML IJ SOLN
INTRAMUSCULAR | Status: AC
  Filled 2019-08-10: qty 2

## 2019-08-10 MED ORDER — SODIUM CHLORIDE 0.9 % IV SOLN
INTRAVENOUS | Status: AC
  Filled 2019-08-10: qty 2

## 2019-08-10 MED ORDER — OMEGA-3-ACID ETHYL ESTERS 1 G PO CAPS
2.0000 g | ORAL_CAPSULE | Freq: Two times a day (BID) | ORAL | Status: DC
  Administered 2019-08-10: 2 g via ORAL
  Filled 2019-08-10: qty 2

## 2019-08-10 MED ORDER — MUPIROCIN 2 % EX OINT: 1.0000 "application " | TOPICAL_OINTMENT | Freq: Once | CUTANEOUS | Status: DC

## 2019-08-10 MED ORDER — CHLORHEXIDINE GLUCONATE 4 % EX LIQD: 4.0000 "application " | Freq: Once | CUTANEOUS | Status: DC

## 2019-08-10 MED ORDER — HEPARIN (PORCINE) IN NACL 1000-0.9 UT/500ML-% IV SOLN
INTRAVENOUS | Status: AC
  Filled 2019-08-10: qty 500

## 2019-08-10 SURGICAL SUPPLY — 8 items
CABLE SURGICAL S-101-97-12 (CABLE) ×3 IMPLANT
IPG PACE AZUR XT DR MRI W1DR01 (Pacemaker) IMPLANT
LEAD CAPSURE NOVUS 5076-52CM (Lead) ×2 IMPLANT
LEAD CAPSURE NOVUS 5076-58CM (Lead) ×2 IMPLANT
PACE AZURE XT DR MRI W1DR01 (Pacemaker) ×3 IMPLANT
PAD PRO RADIOLUCENT 2001M-C (PAD) ×3 IMPLANT
SHEATH 7FR PRELUDE SNAP 13 (SHEATH) ×4 IMPLANT
TRAY PACEMAKER INSERTION (PACKS) ×3 IMPLANT

## 2019-08-10 NOTE — Discharge Summary (Addendum)
ELECTROPHYSIOLOGY PROCEDURE DISCHARGE SUMMARY    Patient ID: Carmen Ford,  MRN: [id number], DOB/AGE: [birth year] 73 y.o.  Admit date: 08/10/2019 Discharge date: 08/11/19   Primary Care Physician: Dellia Beckwith, PA-C  Primary Cardiologist: No primary care provider on file.  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 Penicillins Other (See Comments)    Tiredness Has patient had a PCN reaction causing immediate rash, facial/tongue/throat swelling, SOB or lightheadedness with hypotension:No Has patient had a PCN reaction causing severe rash involving mucus membranes or skin necrosis: No Has patient had a PCN reaction that required hospitalization: No Has patient had a PCN reaction occurring within the last 10 years: No If all of the above answers are "NO", then may proceed with Cephalosporin use.   . Statins Other (See Comments)    myalgias     Procedures This Admission:  1.  Implantation of a Medtronic dual chamber PPM on 08/10/2019 by Dr. Curt Bears.  The patient received a Medtronic model number [id number] PPM with model number 5076 right atrial lead and 5076 right ventricular lead. There were no immediate post procedure complications. 2.  CXR on 08/11/2019 demonstrated no pneumothorax status post device implantation.   Brief HPI: Carmen Ford is a 73 y.o. female was referred to electrophysiology in the outpatient setting for consideration of PPM implantation.  Past medical history includes above.  The patient has had symptomatic bradycardia without reversible causes identified.  Risks, benefits, and alternatives to PPM implantation were reviewed with the patient who wished to proceed.   Hospital Course:  The patient was admitted and  underwent implantation of a Medtronic dual chamber PPM with details as outlined above.  She was monitored on telemetry overnight which demonstrated appropriate pacing.  Left chest was without hematoma or ecchymosis.  The device was interrogated and found to be functioning normally.  CXR was obtained and demonstrated no pneumothorax status post device implantation.  Wound care, arm mobility, and restrictions were reviewed with the patient.  The patient was examined and considered stable for discharge to home.    Physical Exam: Vitals:   08/11/19 0031 08/11/19 0300 08/11/19 0336 08/11/19 0804  BP: (!) 157/62  (!) 150/59 (!) 156/57  Pulse: 64  65 63  Resp: 20  17   Temp: 98.7 F (37.1 C)  98.6 F (37 C) 97.6 F (36.4 C)  TempSrc: Oral  Oral Oral  SpO2: 96%  92% 96%  Weight:  108.9 kg    Height:        GEN- The patient is well appearing, alert and oriented x 3 today.   HEENT: normocephalic, atraumatic; sclera clear, conjunctiva pink; hearing intact; oropharynx clear; neck supple, no JVP Lymph- no cervical lymphadenopathy Lungs- Clear to ausculation bilaterally, normal work of breathing.  No wheezes, rales, rhonchi Heart- Regular rate and rhythm, no murmurs, rubs or gallops, PMI not laterally displaced GI- soft, non-tender, non-distended, bowel sounds present, no hepatosplenomegaly Extremities- no clubbing, cyanosis, or edema; DP/PT/radial pulses 2+ bilaterally MS- no significant deformity or atrophy Skin- warm and dry, no rash or lesion, left chest without hematoma/ecchymosis Psych- euthymic mood, full affect Neuro- strength and sensation are intact   Labs:   Lab Results  Component Value Date   WBC 7.4 07/19/2019   HGB 10.9 (L) 07/19/2019   HCT  33.8 (L) 07/19/2019   MCV 86 07/19/2019   PLT 249 07/19/2019   No results for input(s): NA, K, CL, CO2, BUN, CREATININE, CALCIUM, PROT, BILITOT, ALKPHOS, ALT, AST, GLUCOSE in the last 168 hours.  Invalid input(s): LABALBU  Discharge  Medications:  Allergies as of 08/11/2019      Reactions   Penicillins Other (See Comments)   Tiredness Has patient had a PCN reaction causing immediate rash, facial/tongue/throat swelling, SOB or lightheadedness with hypotension:No Has patient had a PCN reaction causing severe rash involving mucus membranes or skin necrosis: No Has patient had a PCN reaction that required hospitalization: No Has patient had a PCN reaction occurring within the last 10 years: No If all of the above answers are "NO", then may proceed with Cephalosporin use.   Statins Other (See Comments)   myalgias      Medication List    TAKE these medications   acetaminophen 325 MG tablet Commonly known as: TYLENOL Take 1-2 tablets (325-650 mg total) by mouth every 4 (four) hours as needed for mild pain.   amLODipine 10 MG tablet Commonly known as: NORVASC Take 10 mg by mouth daily.   apixaban 5 MG Tabs tablet Commonly known as: Eliquis Take 1 tablet (5 mg total) by mouth 2 (two) times daily.   EQL Vitamin D3 50 MCG (2000 UT) Caps Generic drug: Cholecalciferol Take 2,000 mg by mouth daily.   furosemide 20 MG tablet Commonly known as: LASIX Take 20 mg by mouth daily as needed for edema.   gabapentin 400 MG capsule Commonly known as: NEURONTIN Take 400 mg by mouth 3 (three) times daily.   insulin glargine 100 UNIT/ML injection Commonly known as: LANTUS Inject 9 Units into the skin 2 (two) times daily.   insulin lispro 100 UNIT/ML injection Commonly known as: HUMALOG Inject 2-19 Units into the skin 3 (three) times daily with meals.   omega-3 acid ethyl esters 1 g capsule Commonly known as: LOVAZA Take 2 g by mouth 2 (two) times daily.   rOPINIRole 2 MG tablet Commonly known as: REQUIP Take 2 mg by mouth at bedtime.       Disposition:   Follow-up Information    [facility] Follow up on 08/24/2019.   Why: at 1030 for post pacemaker check Contact  information: [place] [phone]       Constance Haw, MD Follow up on 11/15/2019.   Specialty: Cardiology Why: at 230 pm for 3 month pacemaker check Contact information: [place] [address] [phone]           Duration of Discharge Encounter: Greater than 30 minutes including physician time.  Signed, Shirley Friar, PA-C  08/11/2019 9:06 AM   I have seen and examined this patient with Oda Kilts.  Agree with above, note added to reflect my findings.  On exam, RRR, no murmurs, lungs clear.  She is now status post Medtronic dual chamber pacemaker for SSS and intermittent complete AV block.  Device functioning appropriately.  Chest x-ray and interrogation without issue.  Plan for discharge today with follow-up in device clinic.  Henli Hey M. Suhayla Chisom MD 08/11/2019 10:14 AM

## 2019-08-10 NOTE — Progress Notes (Signed)
Pt's daughter Charna Busman [phone]) would like to be called during MD morning rounds.

## 2019-08-10 NOTE — H&P (Signed)
Carmen Ford has presented today for surgery, with the diagnosis of sick sinus syndrome.  The various methods of treatment have been discussed with the patient and family. After consideration of risks, benefits and other options for treatment, the patient has consented to  Procedure(s): Pacemaker implant as a surgical intervention .  Risks include but not limited to bleeding, tamponade, infection, pneumothorax, among others. The patient's history has been reviewed, patient examined, no change in status, stable for surgery.  I have reviewed the patient's chart and labs.  Questions were answered to the patient's satisfaction.    Jihan Rudy Curt Bears, MD 08/10/2019 2:08 PM

## 2019-08-10 NOTE — Plan of Care (Signed)
Pt is alert and oriented X4. Denies any pain at the moment.Oriented to the room. Skin warm and dry. Call bell within reach. No respiratory distress noted. Will continue monitoring.

## 2019-08-10 NOTE — Progress Notes (Signed)
Pt daughter called and updated that pt is going over now for her procedure.

## 2019-08-10 NOTE — Progress Notes (Signed)
[facility] [address] Dr Curt Bears called new orders noted

## 2019-08-11 ENCOUNTER — Encounter (HOSPITAL_COMMUNITY): Payer: Self-pay | Admitting: Cardiology

## 2019-08-11 ENCOUNTER — Ambulatory Visit (HOSPITAL_COMMUNITY): Payer: Medicare Other

## 2019-08-11 DIAGNOSIS — I495 Sick sinus syndrome: Secondary | ICD-10-CM

## 2019-08-11 DIAGNOSIS — N183 Chronic kidney disease, stage 3 unspecified: Secondary | ICD-10-CM | POA: Diagnosis not present

## 2019-08-11 DIAGNOSIS — I4819 Other persistent atrial fibrillation: Secondary | ICD-10-CM | POA: Diagnosis not present

## 2019-08-11 DIAGNOSIS — I130 Hypertensive heart and chronic kidney disease with heart failure and stage 1 through stage 4 chronic kidney disease, or unspecified chronic kidney disease: Secondary | ICD-10-CM | POA: Diagnosis not present

## 2019-08-11 LAB — GLUCOSE, CAPILLARY: Glucose-Capillary: 115 mg/dL — ABNORMAL HIGH (ref 70–99)

## 2019-08-11 MED ORDER — ACETAMINOPHEN 325 MG PO TABS: 325.0000 mg | ORAL_TABLET | ORAL | Status: AC | PRN

## 2019-08-11 NOTE — Progress Notes (Signed)
Pt has orders to be discharged. Discharge instructions given and pt has no additional questions at this time. Medication regimen reviewed and pt educated. Pt verbalized understanding and has no additional questions. Telemetry box removed. IV removed and site in good condition. Pt stable and waiting for transportation. 

## 2019-08-11 NOTE — Progress Notes (Signed)
Orthopedic Tech Progress Note Patient Details:  Carmen Ford 12-03-1945 ID:[id number] Did arm sling training with patient and daughter. Patient ID: Carmen Ford, female   DOB: [birth year], 73 y.o.   MRN: [id number]   [name] 08/11/2019, 11:20 AM

## 2019-08-19 ENCOUNTER — Telehealth: Payer: Self-pay | Admitting: Cardiology

## 2019-08-19 NOTE — Telephone Encounter (Signed)
New Message  Patient is calling in to get approval to have her daughter Charna Busman) accompany her to her appointment with the device clinic on 08/24/19 at 10:30am. Please give patient a call back to confirm.

## 2019-08-19 NOTE — Telephone Encounter (Signed)
Spoke with patient. Explained current visitor restrictions. Pt does not need assistance from daughter for mobility or comprehension. Offered that we can call pt's daughter on speaker phone during visit. Pt is in agreement with this plan and will bring her daughter's number with her. Appointment notes updated. No further questions at this time.

## 2019-08-24 ENCOUNTER — Ambulatory Visit (INDEPENDENT_AMBULATORY_CARE_PROVIDER_SITE_OTHER): Payer: Medicare Other | Admitting: Student

## 2019-08-24 ENCOUNTER — Other Ambulatory Visit: Payer: Self-pay

## 2019-08-24 DIAGNOSIS — I495 Sick sinus syndrome: Secondary | ICD-10-CM

## 2019-08-24 LAB — CUP PACEART INCLINIC DEVICE CHECK
Battery Remaining Longevity: 163 mo
Battery Voltage: 3.22 V
Brady Statistic AP VP Percent: 9.13 %
Brady Statistic AP VS Percent: 26.2 %
Brady Statistic AS VP Percent: 1.25 %
Brady Statistic AS VS Percent: 63.42 %
Brady Statistic RA Percent Paced: 34.29 %
Brady Statistic RV Percent Paced: 11.78 %
Date Time Interrogation Session: 20201020110113
Implantable Lead Implant Date: 20201006
Implantable Lead Implant Date: 20201006
Implantable Lead Location: 753859
Implantable Lead Location: 753860
Implantable Lead Model: 5076
Implantable Lead Model: 5076
Implantable Pulse Generator Implant Date: 20201006
Lead Channel Impedance Value: 285 Ohm
Lead Channel Impedance Value: 361 Ohm
Lead Channel Impedance Value: 437 Ohm
Lead Channel Impedance Value: 437 Ohm
Lead Channel Pacing Threshold Amplitude: 0.5 V
Lead Channel Pacing Threshold Amplitude: 0.625 V
Lead Channel Pacing Threshold Pulse Width: 0.4 ms
Lead Channel Pacing Threshold Pulse Width: 0.4 ms
Lead Channel Sensing Intrinsic Amplitude: 2.375 mV
Lead Channel Sensing Intrinsic Amplitude: 2.5 mV
Lead Channel Sensing Intrinsic Amplitude: 8.75 mV
Lead Channel Sensing Intrinsic Amplitude: 9 mV
Lead Channel Setting Pacing Amplitude: 3.5 V
Lead Channel Setting Pacing Amplitude: 3.5 V
Lead Channel Setting Pacing Pulse Width: 0.4 ms
Lead Channel Setting Sensing Sensitivity: 1.2 mV

## 2019-08-24 NOTE — Progress Notes (Signed)
Wound check appointment. Steri-strips removed. Wound without redness or edema. Incision edges approximated, wound well healed. Normal device function. Thresholds, sensing, and impedances consistent with implant measurements. Device programmed at 3.5V for extra safety margin until 3 month visit. Histogram distribution appropriate for patient and level of activity. 1 AF. Known, low burden on [address]. Patient educated about wound care, arm mobility, lifting restrictions. ROV in 3 months with Dr. Curt Bears.

## 2019-08-30 ENCOUNTER — Telehealth: Payer: Self-pay | Admitting: Cardiology

## 2019-08-30 MED ORDER — CLONIDINE HCL 0.1 MG PO TABS: 0.1000 mg | ORAL_TABLET | Freq: Every day | ORAL | 0 refills | Status: DC

## 2019-08-30 NOTE — Telephone Encounter (Signed)
Patient confirmed that she has an arm cuff at home that was sent to her by [employer]. She is agreeable to adding clonidine 0.1 mg daily at bedtime to her medication regimen to help better control her hypertension. Prescription has been sent to the [facility] in [place] as requested. Patient has been scheduled for an office visit with Dr. Bettina Gavia tomorrow, 08/31/2019, at 1:15 pm in the [facility] office for further evaluation and she will bring her BP log for review. Patient is agreeable to plan and verbalized understanding. No further questions.

## 2019-08-30 NOTE — Addendum Note (Signed)
Addended by: Austin Miles on: 08/30/2019 04:02 PM   Modules accepted: Orders

## 2019-08-30 NOTE — Telephone Encounter (Signed)
Called patient who reports her blood pressure has been ranging from 178-200/68-82 since her pacemaker was placed on 08/10/2019. Patient denies any symptoms associated with hypertension. She is taking all medications as prescribed. Please advise. Thanks!

## 2019-08-30 NOTE — Telephone Encounter (Signed)
Her BP is sky high since her pacemaker

## 2019-08-30 NOTE — Telephone Encounter (Signed)
Can we confirm whether her home BP cuff is arm versus wrist? If wrist cuff I doubt the accuracy. Also please ask if BP cuff has ever been compared to a manual reading to check for accuracy. Would ask her to continue to check daily and write it down.   Her PPM would not cause elevated BP. But we would like to optimize her blood pressure. Her BP while inpatient for PPM was [id number] systolic so we know it was on the higher side even before PPM.  Would be an appropriate add-on for Thursday or Friday in [place] with [facility]. Please bring BP log. She can also be added onto my schedule if she is willing to come to [place].   IF BP cuff is ARM cuff and presumed accurate - START Clonidine 0.1mg  at bedtime IF BP cuff is WRIST cuff - recommend purchase arm cuff. Await medicine changes until office visit.   Loel Dubonnet, NP

## 2019-08-31 ENCOUNTER — Encounter: Payer: Self-pay | Admitting: Cardiology

## 2019-08-31 ENCOUNTER — Other Ambulatory Visit: Payer: Self-pay

## 2019-08-31 ENCOUNTER — Ambulatory Visit (INDEPENDENT_AMBULATORY_CARE_PROVIDER_SITE_OTHER): Payer: Medicare Other | Admitting: Cardiology

## 2019-08-31 VITALS — BP 150/70 | HR 64 | Ht 64.0 in | Wt 244.4 lb

## 2019-08-31 DIAGNOSIS — I110 Hypertensive heart disease with heart failure: Secondary | ICD-10-CM

## 2019-08-31 DIAGNOSIS — N1832 Chronic kidney disease, stage 3b: Secondary | ICD-10-CM

## 2019-08-31 DIAGNOSIS — I4819 Other persistent atrial fibrillation: Secondary | ICD-10-CM | POA: Diagnosis not present

## 2019-08-31 DIAGNOSIS — I5032 Chronic diastolic (congestive) heart failure: Secondary | ICD-10-CM | POA: Diagnosis not present

## 2019-08-31 DIAGNOSIS — I25119 Atherosclerotic heart disease of native coronary artery with unspecified angina pectoris: Secondary | ICD-10-CM

## 2019-08-31 DIAGNOSIS — E782 Mixed hyperlipidemia: Secondary | ICD-10-CM

## 2019-08-31 DIAGNOSIS — Z7901 Long term (current) use of anticoagulants: Secondary | ICD-10-CM

## 2019-08-31 MED ORDER — CARVEDILOL 25 MG PO TABS: 12.5000 mg | ORAL_TABLET | Freq: Two times a day (BID) | ORAL | 3 refills | Status: DC

## 2019-08-31 NOTE — Patient Instructions (Signed)
Medication Instructions:  Your physician has recommended you make the following change in your medication:  START Carvedilol 25 mg 1/2 tablet twice a day CONTINUE Clonidine 0.1  *If you need a refill on your cardiac medications before your next appointment, please call your pharmacy*  Lab Work: None ordered If you have labs (blood work) drawn today and your tests are completely normal, you will receive your results only by: [name] MyChart Message (if you have MyChart) OR . A paper copy in the mail If you have any lab test that is abnormal or we need to change your treatment, we will call you to review the results.  Testing/Procedures: None ordered  Follow-Up: At [facility], you and your health needs are our priority.  As part of our continuing mission to provide you with exceptional heart care, we have created designated Provider Care Teams.  These Care Teams include your primary Cardiologist (physician) and Advanced Practice Providers (APPs -  Physician Assistants and Nurse Practitioners) who all work together to provide you with the care you need, when you need it.  Your next appointment:   3 months  The format for your next appointment:   In Person  Provider:   Shirlee More, MD  Other Instructions We will contact you to schedule your follow up appointment by phone or by mail.

## 2019-08-31 NOTE — Progress Notes (Signed)
Cardiology Office Note:    Date:  08/31/2019   ID:  Carmen Ford, DOB 1946/06/18, MRN [id number]  PCP:  Dellia Beckwith, PA-C  Cardiologist:  Shirlee More, MD    Referring MD: Dellia Beckwith, *    ASSESSMENT:    1. Hypertensive heart disease with heart failure (HCC)   2. Persistent atrial fibrillation ([place])   3. Chronic anticoagulation   4. Chronic diastolic heart failure (HCC)   5. Stage 3b chronic kidney disease   6. Coronary artery disease involving native coronary artery of native heart with angina pectoris ([place])   7. Mixed hyperlipidemia    PLAN:    In order of problems listed above:  1. Poorly controlled resume carvedilol continue clonidine continue to follow blood pressure at home and her previous antihypertensive medications 2. Improved she is in sinus rhythm she is paroxysmal 3. Continue anticoagulant 4. Able CAD having no angina continue medical therapy 5. Compensated continue her current loop diuretic 6. Continue lipid-lowering treatment, continue fish oil and at next visit we will discuss the merits PCSK9 therapy as she is statin intolerant   Next appointment: 3 months   Medication Adjustments/Labs and Tests Ordered: Current medicines are reviewed at length with the patient today.  Concerns regarding medicines are outlined above.  No orders of the defined types were placed in this encounter.  No orders of the defined types were placed in this encounter.   Chief Complaint  Patient presents with  . Follow-up  . Hypertension  . Congestive Heart Failure  . Chronic Kidney Disease  . Atrial Fibrillation  . Anticoagulation  . Coronary Artery Disease  . Hyperlipidemia    History of Present Illness:    Carmen Ford is a 73 y.o. female with a hx of CAD CABG  heart failure stage 4 CKD hypertension hyperlipidemia and bradycardia second degree AV block with hyperkalemia  last seen 05/25/2019.  She had a dual-chamber pacemaker inserted  08/10/2019 Medtronic for sick sinus syndrome her pacemaker check 08/24/2019 showed normal parameters and she had a low burden of atrial fibrillation and its noted that she is anticoagulated. Compliance with diet, lifestyle and medications: Yes  Is here today because home blood pressures have been running from 1 [id number] systolic and was started on clonidine at bedtime yesterday.  Is improved after pacemaker no lightheadedness or palpitation no shortness of breath edema or chest pain.  Previously she had a beautiful response to carvedilol discontinued because of her bradycardia will resume it and continue clonidine we can withdraw in the future if needed she will continue to be followed with home telemetry BP device  Conclusion  Wound check appointment. Steri-strips removed. Wound without redness or edema. Incision edges approximated, wound well healed. Normal device function. Thresholds, sensing, and impedances consistent with implant measurements. Device programmed at 3.5V for  extra safety margin until 3 month visit. Histogram distribution appropriate for patient and level of activity. 1 AF. Known, low burden on [place]. Patient educated about wound care, arm mobility, lifting restrictions. ROV in 3 months with Dr. Curt Bears.  Medications  None  Result Report  Battery Remaining Longevity: 163 mo     Battery Status: OK       Battery Voltage: 3.22 V     Brady Statistic AP VP Percent: 9.13 %      Brady Statistic AP VS Percent: 26.20 %     Brady Statistic AS VP Percent: 1.25 %      [employer] AS  VS Percent: 63.42 %     Brady Statistic RA Percent Paced: 34.29 %      Brady Statistic RV Percent Paced: 11.78 %     Clinic Name: [facility]       Date Time Interrogation Session: [id number]      Eval Rhythm: AS-VS 64 bpm       Implantable Lead Implant Date: [id number]      Implantable Lead Implant Date: [id number]       Implantable Lead Location: [id number]      Implantable  Lead Location: [id number]       Implantable Lead Location Detail 1: APPENDAGE      Implantable Lead Location Detail 1: APEX       Implantable Lead Manufacturer: MERM      Implantable Lead Manufacturer: MERM       Implantable Lead Model: 5076 CapSureFix Novus MRI SureScan      Implantable Lead Model: 5076 CapSureFix Novus MRI SureScan       Implantable Lead Serial Number: [id number]      Implantable Lead Serial Number: [id number]       Implantable Pulse Generator Implant Date: [id number]      Implantable Pulse Generator Type: Implantable Pulse Generator       Lead Channel Impedance Value: 437 ohm     Lead Channel Impedance Value: 285 ohm      Lead Channel Impedance Value: 437 ohm     Lead Channel Impedance Value: 361 ohm      Lead Channel Pacing Threshold Amplitude: 0.5 V     Lead Channel Pacing Threshold Amplitude: 0.625 V      Lead Channel Pacing Threshold Pulse Width: 0.4 ms     Lead Channel Pacing Threshold Pulse Width: 0.4 ms      Lead Channel Sensing Intrinsic Amplitude: 2.5 mV     Lead Channel Sensing Intrinsic Amplitude: 2.375 mV      Lead Channel Sensing Intrinsic Amplitude: 8.75 mV     Lead Channel Sensing Intrinsic Amplitude: 9 mV      Lead Channel Setting Pacing Amplitude: 3.5 V     Lead Channel Setting Pacing Amplitude: 3.5 V      Lead Channel Setting Pacing Pulse Width: 0.4 ms     Lead Channel Setting Sensing Sensitivity: 1.2 mV      Pulse Gen Model: [id number] Azure XT DR MRI      Pulse Gen Serial Number: [id number]       Pulse Generator Manufacturer:       Past Medical History:  Diagnosis Date  . Anemia   . Anemia in chronic kidney disease 10/15/2016  . Bradycardia    Overview:  Not on a beta blocker with sinus bradycardia and Mobitz 1 second degree AV block  . Chronic diastolic heart failure ([place]) 02/26/2016   Overview:  Overview:  EF normal by echo 2013  . Chronic ischemic heart disease   . CKD (chronic kidney disease)  05/15/2017  . Colitis, acute   . Coronary artery disease    CABG  2011: LTA to LAD, SVG to M, SVG to PDA EF 40%  . Diabetes mellitus without complication ([place])    type II   . Essential (primary) hypertension 05/15/2017  . Fatigue 10/15/2016  . Hyperlipidemia 05/15/2017  . Hypertensive heart disease with heart failure ([place])   . Myocardial infarction ([place])   . 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 Restless legs syndrome 05/15/2017  . Vitamin D deficiency 05/15/2017    Past Surgical History:  Procedure Laterality Date  . ACHILLES TENDON SURGERY Left 2005   inserted a new tendon  . CARDIAC SURGERY    . CATARACT EXTRACTION, BILATERAL    . CHOLECYSTECTOMY  02/2016  . COLONOSCOPY    . CORONARY ARTERY BYPASS GRAFT  2011   [facility]  . KNEE ARTHROPLASTY Left 06/09/2017   Procedure: LEFT TOTAL KNEE ARTHROPLASTY WITH COMPUTER NAVIGATION;  Surgeon: Rod Can, MD;  Location: [place] [facility];  Service: Orthopedics;  Laterality: Left;  NEED RNFA  . PACEMAKER IMPLANT N/A 08/10/2019   Procedure: PACEMAKER IMPLANT;  Surgeon: Constance Haw, MD;  Location: [place] [facility];  Service: Cardiovascular;  Laterality: N/A;  . PATELLAR TENDON REPAIR Left 07/03/2017   Procedure: PATELLA TENDON REPAIR LEFT KNEE;  Surgeon: Rod Can, MD;  Location: [facility];  Service: Orthopedics;  Laterality: Left;  [name] VARICOSE VEIN SURGERY Bilateral 1990's    Current Medications: Current Meds  Medication Sig  . acetaminophen (TYLENOL) 325 MG tablet Take 1-2 tablets (325-650 mg total) by mouth every 4 (four) hours as needed for mild pain.  [name] amLODipine (NORVASC) 10 MG tablet Take 10 mg by mouth daily.   [name] apixaban (ELIQUIS) 5 MG TABS tablet Take 1 tablet (5 mg total) by mouth 2 (two) times daily.  . Cholecalciferol (EQL VITAMIN D3) 2000 units CAPS Take 2,000 mg by mouth daily.  . cloNIDine (CATAPRES) 0.1 MG tablet Take 1 tablet (0.1 mg total) by mouth at bedtime.  . furosemide (LASIX) 20 MG tablet Take 20 mg by  mouth daily as needed for edema.   . gabapentin (NEURONTIN) 400 MG capsule Take 400 mg by mouth 3 (three) times daily.   . insulin glargine (LANTUS) 100 UNIT/ML injection Inject 9 Units into the skin 2 (two) times daily.   . insulin lispro (HUMALOG) 100 UNIT/ML injection Inject 2-19 Units into the skin 3 (three) times daily with meals.   [name] omega-3 acid ethyl esters (LOVAZA) 1 G capsule Take 2 g by mouth 2 (two) times daily.   [name] rOPINIRole (REQUIP) 2 MG tablet Take 2 mg by mouth at bedtime.      Allergies:   Penicillins and Statins   Social History   Socioeconomic History  . Marital status: Married    Spouse name: Not on file  . Number of children: Not on file  . Years of education: Not on file  . Highest education level: Not on file  Occupational History  . Not on file  Social Needs  . Financial resource strain: Not on file  . Food insecurity    Worry: Not on file    Inability: Not on file  . Transportation needs    Medical: Not on file    Non-medical: Not on file  Tobacco Use  . Smoking status: Never Smoker  . Smokeless tobacco: Never Used  Substance and Sexual Activity  . Alcohol use: No    Alcohol/week: 0.0 standard drinks  . Drug use: No  . Sexual activity: Not on file  Lifestyle  . Physical activity    Days per week: Not on file    Minutes per session: Not on file  . Stress: Not on file  Relationships  . Social Herbalist on phone: Not on file    Gets together: Not on file    Attends religious service: Not on file    Active member of club or organization: Not on file    Attends meetings of clubs or organizations:  Not on file    Relationship status: Not on file  Other Topics Concern  . Not on file  Social History Narrative  . Not on file     Family History: The patient's family history includes Cancer in her brother; Congestive Heart Failure in her father; Diabetes in her mother; Heart failure in her mother; Hypertension in her mother and son.  ROS:   Please see the history of present illness.    All other systems reviewed and are negative.  EKGs/Labs/Other Studies Reviewed:    The following studies were reviewed today:  EKG:  EKG performed 08/11/2019 showed atrial paced rhythm first-degree heart block and poor R wave progression with intrinsic QRS morphology AV nodal conduction personally reviewed  Recent Labs: 12/14/2018: ALT 10; NT-Pro BNP 2,254 07/19/2019: BUN 32; Creatinine, Ser 1.57; Hemoglobin 10.9; Platelets 249; Potassium 4.6; Sodium 142  Recent Lipid Panel 04/20/2019 cholesterol 232 HDL 35 LDL 164  Physical Exam:    VS:  BP (!) 150/70   Pulse 64   Ht 5\' 4"  (1.626 m)   Wt 244 lb 6.4 oz (110.9 kg)   SpO2 94%   BMI 41.95 kg/m     Wt Readings from Last 3 Encounters:  08/31/19 244 lb 6.4 oz (110.9 kg)  08/11/19 240 lb 1.3 oz (108.9 kg)  07/19/19 239 lb 7.2 oz (108.6 kg)     GEN: Controlled BCD BMI exceeds 40 well nourished, well developed in no acute distress HEENT: Normal NECK: No JVD; No carotid bruits LYMPHATICS: No lymphadenopathy CARDIAC: RRR, no murmurs, rubs, gallops RESPIRATORY:  Clear to auscultation without rales, wheezing or rhonchi  ABDOMEN: Soft, non-tender, non-distended MUSCULOSKELETAL:  No edema; No deformity  SKIN: Warm and dry NEUROLOGIC:  Alert and oriented x 3 PSYCHIATRIC:  Normal affect    Signed, Shirlee More, MD  08/31/2019 1:34 PM    [facility]

## 2019-10-15 ENCOUNTER — Other Ambulatory Visit: Payer: Self-pay | Admitting: Cardiology

## 2019-11-10 ENCOUNTER — Ambulatory Visit (INDEPENDENT_AMBULATORY_CARE_PROVIDER_SITE_OTHER): Payer: Medicare PPO | Admitting: *Deleted

## 2019-11-10 DIAGNOSIS — I495 Sick sinus syndrome: Secondary | ICD-10-CM

## 2019-11-11 LAB — CUP PACEART REMOTE DEVICE CHECK
Battery Remaining Longevity: 110 mo
Battery Voltage: 3.16 V
Brady Statistic AP VP Percent: 43.61 %
Brady Statistic AP VS Percent: 41.7 %
Brady Statistic AS VP Percent: 1.73 %
Brady Statistic AS VS Percent: 12.95 %
Brady Statistic RA Percent Paced: 80.64 %
Brady Statistic RV Percent Paced: 47.83 %
Date Time Interrogation Session: 20210105204602
Implantable Lead Implant Date: 20201006
Implantable Lead Implant Date: 20201006
Implantable Lead Location: 753859
Implantable Lead Location: 753860
Implantable Lead Model: 5076
Implantable Lead Model: 5076
Implantable Pulse Generator Implant Date: 20201006
Lead Channel Impedance Value: 304 Ohm
Lead Channel Impedance Value: 380 Ohm
Lead Channel Impedance Value: 418 Ohm
Lead Channel Impedance Value: 437 Ohm
Lead Channel Pacing Threshold Amplitude: 0.5 V
Lead Channel Pacing Threshold Amplitude: 0.625 V
Lead Channel Pacing Threshold Pulse Width: 0.4 ms
Lead Channel Pacing Threshold Pulse Width: 0.4 ms
Lead Channel Sensing Intrinsic Amplitude: 2.25 mV
Lead Channel Sensing Intrinsic Amplitude: 2.25 mV
Lead Channel Sensing Intrinsic Amplitude: 9.625 mV
Lead Channel Sensing Intrinsic Amplitude: 9.625 mV
Lead Channel Setting Pacing Amplitude: 3.5 V
Lead Channel Setting Pacing Amplitude: 3.5 V
Lead Channel Setting Pacing Pulse Width: 0.4 ms
Lead Channel Setting Sensing Sensitivity: 1.2 mV

## 2019-11-15 ENCOUNTER — Encounter: Payer: Medicare Other | Admitting: Cardiology

## 2019-11-21 NOTE — Progress Notes (Signed)
Electrophysiology Office Note   Date:  11/22/2019   ID:  LARSON KLEINER, DOB 1946-08-11, MRN [id number]  PCP:  Dellia Beckwith, PA-C  Cardiologist:  Bettina Gavia Primary Electrophysiologist:  Cherika Jessie Meredith Leeds, MD    No chief complaint on file.    History of Present Illness: Carmen Ford is a 74 y.o. female who is being seen today for the evaluation of atrial fibrillation at the request of Dellia Beckwith, *. Presenting today for electrophysiology evaluation.  She has a history of persistent atrial fibrillation, chronic diastolic heart failure, coronary artery disease, hypertension, hyperlipidemia, and stage III CKD.  She was recently diagnosed with pneumonia and was hospitalized.  She was also in acute on chronic renal failure.  During her hospitalization, she had episodes of AV block.  She presented to her primary cardiologist office in atrial fibrillation.  She has had multiple episodes of AV block and bradycardia typically in the setting of acute renal failure and hyperkalemia.  Her most recent episode, she did not have hyperkalemia.  She is now status post Medtronic dual-chamber pacemaker implanted 08/10/2019.  Today, denies symptoms of palpitations, chest pain, shortness of breath, orthopnea, PND, lower extremity edema, claudication, dizziness, presyncope, syncope, bleeding, or neurologic sequela. The patient is tolerating medications without difficulties.  Overall she is feeling well and has had no major issues.  She does note that she has been a little weak and fatigued when she exerts herself.  Review of device interrogation shows that her heart rates have been blunted.  Past Medical History:  Diagnosis Date  . Anemia   . Anemia in chronic kidney disease 10/15/2016  . Bradycardia    Overview:  Not on a beta blocker with sinus bradycardia and Mobitz 1 second degree AV block  . Chronic diastolic heart failure ([place]) 02/26/2016   Overview:  Overview:  EF normal by echo 2013   . Chronic ischemic heart disease   . CKD (chronic kidney disease) 05/15/2017  . Colitis, acute   . Coronary artery disease    CABG  2011: LTA to LAD, SVG to M, SVG to PDA EF 40%  . Diabetes mellitus without complication ([place])    type II   . Essential (primary) hypertension 05/15/2017  . Fatigue 10/15/2016  . Hyperlipidemia 05/15/2017  . Hypertensive heart disease with heart failure ([place])   . Myocardial infarction ([place])   . 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 Restless legs syndrome 05/15/2017  . Vitamin D deficiency 05/15/2017   Past Surgical History:  Procedure Laterality Date  . ACHILLES TENDON SURGERY Left 2005   inserted a new tendon  . CARDIAC SURGERY    . CATARACT EXTRACTION, BILATERAL    . CHOLECYSTECTOMY  02/2016  . COLONOSCOPY    . CORONARY ARTERY BYPASS GRAFT  2011   [facility]  . KNEE ARTHROPLASTY Left 06/09/2017   Procedure: LEFT TOTAL KNEE ARTHROPLASTY WITH COMPUTER NAVIGATION;  Surgeon: Rod Can, MD;  Location: [facility];  Service: Orthopedics;  Laterality: Left;  NEED RNFA  . PACEMAKER IMPLANT N/A 08/10/2019   Procedure: PACEMAKER IMPLANT;  Surgeon: Constance Haw, MD;  Location: [place] [facility];  Service: Cardiovascular;  Laterality: N/A;  . PATELLAR TENDON REPAIR Left 07/03/2017   Procedure: PATELLA TENDON REPAIR LEFT KNEE;  Surgeon: Rod Can, MD;  [facility];  Service: Orthopedics;  Laterality: Left;  [name] VARICOSE VEIN SURGERY Bilateral 1990's     Current Outpatient Medications  Medication Sig Dispense Refill  . acetaminophen (TYLENOL) 325  MG tablet Take 1-2 tablets (325-650 mg total) by mouth every 4 (four) hours as needed for mild pain.    [name] amLODipine (NORVASC) 10 MG tablet Take 10 mg by mouth daily.     . carvedilol (COREG) 25 MG tablet Take 0.5 tablets (12.5 mg total) by mouth 2 (two) times daily. 30 tablet 3  . Cholecalciferol (EQL VITAMIN D3) 2000 units CAPS Take 2,000 mg by mouth daily.    . cloNIDine (CATAPRES) 0.1 MG tablet  Take 1 tablet (0.1 mg total) by mouth at bedtime. 30 tablet 0  . ELIQUIS 5 MG TABS tablet Take 1 tablet by mouth twice daily 180 tablet 0  . furosemide (LASIX) 20 MG tablet Take 20 mg by mouth daily as needed for edema.     . gabapentin (NEURONTIN) 400 MG capsule Take 400 mg by mouth 3 (three) times daily.     . insulin glargine (LANTUS) 100 UNIT/ML injection Inject 9 Units into the skin 2 (two) times daily.     . insulin lispro (HUMALOG) 100 UNIT/ML injection Inject 2-19 Units into the skin 3 (three) times daily with meals.     [name] omega-3 acid ethyl esters (LOVAZA) 1 G capsule Take 2 g by mouth 2 (two) times daily.     [name] rOPINIRole (REQUIP) 2 MG tablet Take 2 mg by mouth at bedtime.      No current facility-administered medications for this visit.    Allergies:   Penicillins and Statins   Social History:  The patient  reports that she has never smoked. She has never used smokeless tobacco. She reports that she does not drink alcohol or use drugs.   Family History:  The patient's family history includes Cancer in her brother; Congestive Heart Failure in her father; Diabetes in her mother; Heart failure in her mother; Hypertension in her mother and son.   ROS:  Please see the history of present illness.   Otherwise, review of systems is positive for none.   All other systems are reviewed and negative.   PHYSICAL EXAM: VS:  BP 134/62   Pulse (!) 55   Ht 5\' 4"  (1.626 m)   Wt 247 lb 9.6 oz (112.3 kg)   SpO2 96%   BMI 42.50 kg/m  , BMI Body mass index is 42.5 kg/m. GEN: Well nourished, well developed, in no acute distress  HEENT: normal  Neck: no JVD, carotid bruits, or masses Cardiac: RRR; no murmurs, rubs, or gallops,no edema  Respiratory:  clear to auscultation bilaterally, normal work of breathing GI: soft, nontender, nondistended, + BS MS: no deformity or atrophy  Skin: warm and dry, device site well healed Neuro:  Strength and sensation are intact Psych: euthymic mood, full  affect  EKG:  EKG is ordered today. Personal review of the ekg ordered shows atrial paced, intermittent ventricular paced, inferior T wave inversions  Personal review of the device interrogation today. Results in [place]: 12/14/2018: ALT 10; NT-Pro BNP 2,254 07/19/2019: BUN 32; Creatinine, Ser 1.57; Hemoglobin 10.9; Platelets 249; Potassium 4.6; Sodium 142    Lipid Panel  No results found for: CHOL, TRIG, HDL, CHOLHDL, VLDL, LDLCALC, LDLDIRECT   Wt Readings from Last 3 Encounters:  11/22/19 247 lb 9.6 oz (112.3 kg)  08/31/19 244 lb 6.4 oz (110.9 kg)  08/11/19 240 lb 1.3 oz (108.9 kg)      Other studies Reviewed: Additional studies/ records that were reviewed today include: Epic notes   ASSESSMENT AND PLAN:  1.  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  Device functioning appropriately.  She has some weakness and fatigue.  Therefore we have changed her from MVP mode to DDD and have turned on rate response.  2.  Persistent atrial fibrillation: Currently on Eliquis.  CHA2DS2-VASc of 4.  Remains in sinus rhythm      Current medicines are reviewed at length with the patient today.   The patient does not have concerns regarding her medicines.  The following changes were made today: None  Labs/ tests ordered today include:  Orders Placed This Encounter  Procedures  . EKG 12-Lead     Disposition:   FU with Herron Fero 12 months  Signed, Yashira Offenberger Meredith Leeds, MD  11/22/2019 9:14 AM     [facility] [address] [place] [address] [phone] (office) [phone] (fax)

## 2019-11-22 ENCOUNTER — Other Ambulatory Visit: Payer: Self-pay

## 2019-11-22 ENCOUNTER — Encounter: Payer: Self-pay | Admitting: Cardiology

## 2019-11-22 ENCOUNTER — Ambulatory Visit (INDEPENDENT_AMBULATORY_CARE_PROVIDER_SITE_OTHER): Payer: Medicare PPO | Admitting: Cardiology

## 2019-11-22 VITALS — BP 134/62 | HR 55 | Ht 64.0 in | Wt 247.6 lb

## 2019-11-22 DIAGNOSIS — I495 Sick sinus syndrome: Secondary | ICD-10-CM | POA: Diagnosis not present

## 2019-11-22 DIAGNOSIS — Z950 Presence of cardiac pacemaker: Secondary | ICD-10-CM

## 2019-11-22 DIAGNOSIS — I4819 Other persistent atrial fibrillation: Secondary | ICD-10-CM

## 2019-11-22 LAB — CUP PACEART INCLINIC DEVICE CHECK
Battery Remaining Longevity: 148 mo
Battery Voltage: 3.16 V
Brady Statistic AP VP Percent: 42.98 %
Brady Statistic AP VS Percent: 39.87 %
Brady Statistic AS VP Percent: 1.62 %
Brady Statistic AS VS Percent: 15.51 %
Brady Statistic RA Percent Paced: 79.65 %
Brady Statistic RV Percent Paced: 46.35 %
Date Time Interrogation Session: 20210118093845
Implantable Lead Implant Date: 20201006
Implantable Lead Implant Date: 20201006
Implantable Lead Location: 753859
Implantable Lead Location: 753860
Implantable Lead Model: 5076
Implantable Lead Model: 5076
Implantable Pulse Generator Implant Date: 20201006
Lead Channel Impedance Value: 285 Ohm
Lead Channel Impedance Value: 361 Ohm
Lead Channel Impedance Value: 418 Ohm
Lead Channel Impedance Value: 437 Ohm
Lead Channel Pacing Threshold Amplitude: 0.5 V
Lead Channel Pacing Threshold Amplitude: 0.75 V
Lead Channel Pacing Threshold Pulse Width: 0.4 ms
Lead Channel Pacing Threshold Pulse Width: 0.4 ms
Lead Channel Sensing Intrinsic Amplitude: 10.125 mV
Lead Channel Sensing Intrinsic Amplitude: 2.25 mV
Lead Channel Setting Pacing Amplitude: 1.75 V
Lead Channel Setting Pacing Amplitude: 2.5 V
Lead Channel Setting Pacing Pulse Width: 0.4 ms
Lead Channel Setting Sensing Sensitivity: 1.2 mV

## 2019-12-08 NOTE — Progress Notes (Signed)
Cardiology Office Note:    Date:  12/09/2019   ID:  Carmen Ford, DOB [birth year], MRN [id number]  PCP:  Dellia Beckwith, PA-C  Cardiologist:  Shirlee More, MD    Referring MD: Dellia Beckwith, *    ASSESSMENT:    1. Coronary artery disease involving native coronary artery of native heart with angina pectoris ([place])   2. Sick sinus syndrome (HCC)   3. Cardiac pacemaker in situ   4. Hypertensive heart disease with heart failure (HCC)   5. Stage 3b chronic kidney disease   6. Mixed hyperlipidemia    PLAN:    In order of problems listed above:  1. Stable CAD she will resume a statin optimize therapy and continue her medical treatment including beta-blocker recently restarted for hypertension control after pacemaker insertion.  Having no angina on current treatment 2. Sick sinus syndrome stable continue to follow in our device clinic 3. Pretension is improved she is not checking BPs at home will continue treatment including clonidine carvedilol and asked her to monitor her blood pressure.  No fluid overload I asked her to take her diuretic every other day 4. Stable CKD followed by nephrology [facility] 5. Poorly controlled resume a low intensity statin   Next appointment: 6 months   Medication Adjustments/Labs and Tests Ordered: Current medicines are reviewed at length with the patient today.  Concerns regarding medicines are outlined above.  No orders of the defined types were placed in this encounter.  Meds ordered this encounter  Medications  . pravastatin (PRAVACHOL) 20 MG tablet    Sig: Take 1 tablet (20 mg total) by mouth every evening.    Dispense:  90 tablet    Refill:  3    No chief complaint on file.   History of Present Illness:    Carmen Ford is a 74 y.o. female with a hx of CAD CABG  heart failure stage 4 CKD hypertension hyperlipidemia and bradycardia second degree AV block with hyperkalemia  last seen 05/25/2019.  She had a dual-chamber  pacemaker inserted 08/10/2019 [employer] for sick sinus syndrome  She was last seen 08/31/2019 with poorly controlled hypertension she was resumed on carvedilol now that bradycardia is no longer an issue.  Her dyslipidemia remains poorly controlled asked her to consider PCSK9 inhibitor therapy. Compliance with diet, lifestyle and medications: Yes  Most recent labs are 12/02/2019 creatinine stable 1.3 7K4.7 cholesterol 216 LDL 158 HDL 32 EKG 11/22/2019 shows atrially paced rhythm left bundle branch block I reviewed lipids and she is agreed to resume pravastatin that she tolerated in the past 20 mg daily.  Recently she has had heartburn purchased but has not started Prilosec and I asked her to take it she has had no anginal discomfort but I will arm her with a prescription for nitroglycerin avoid ED visits during COVID-19.  She received her first dose of vaccine today.  She has no edema takes her diuretic every other day has mild exertional shortness of breath but no orthopnea chest pain palpitation or syncope. Past Medical History:  Diagnosis Date  . Anemia   . Anemia in chronic kidney disease 10/15/2016  . Bradycardia    Overview:  Not on a beta blocker with sinus bradycardia and Mobitz 1 second degree AV block  . Chronic diastolic heart failure ([place]) 02/26/2016   Overview:  Overview:  EF normal by echo 2013  . Chronic ischemic heart disease   . CKD (chronic kidney disease) 05/15/2017  .  Colitis, acute   . Coronary artery disease    CABG  2011: LTA to LAD, SVG to M, SVG to PDA EF 40%  . Diabetes mellitus without complication ([place])    type II   . Essential (primary) hypertension 05/15/2017  . Fatigue 10/15/2016  . Hyperlipidemia 05/15/2017  . Hypertensive heart disease with heart failure ([place])   . Myocardial infarction ([place])   . 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 Restless legs syndrome 05/15/2017  . Vitamin D deficiency 05/15/2017    Past Surgical History:  Procedure Laterality Date  .  ACHILLES TENDON SURGERY Left 2005   inserted a new tendon  . CARDIAC SURGERY    . CATARACT EXTRACTION, BILATERAL    . CHOLECYSTECTOMY  02/2016  . COLONOSCOPY    . CORONARY ARTERY BYPASS GRAFT  2011   [facility]  . KNEE ARTHROPLASTY Left 06/09/2017   Procedure: LEFT TOTAL KNEE ARTHROPLASTY WITH COMPUTER NAVIGATION;  Surgeon: Rod Can, MD;  Location: [facility];  Service: Orthopedics;  Laterality: Left;  NEED RNFA  . PACEMAKER IMPLANT N/A 08/10/2019   Procedure: PACEMAKER IMPLANT;  Surgeon: Constance Haw, MD;  Location: [facility];  Service: Cardiovascular;  Laterality: N/A;  . PATELLAR TENDON REPAIR Left 07/03/2017   Procedure: PATELLA TENDON REPAIR LEFT KNEE;  Surgeon: Rod Can, MD;  Location: [facility];  Service: Orthopedics;  Laterality: Left;  [name] VARICOSE VEIN SURGERY Bilateral 1990's    Current Medications: Current Meds  Medication Sig  . acetaminophen (TYLENOL) 325 MG tablet Take 1-2 tablets (325-650 mg total) by mouth every 4 (four) hours as needed for mild pain.  [name] amLODipine (NORVASC) 10 MG tablet Take 10 mg by mouth daily.   . carvedilol (COREG) 25 MG tablet Take 0.5 tablets (12.5 mg total) by mouth 2 (two) times daily.  . Cholecalciferol (EQL VITAMIN D3) 2000 units CAPS Take 2,000 mg by mouth daily.  . cloNIDine (CATAPRES) 0.1 MG tablet Take 1 tablet (0.1 mg total) by mouth at bedtime.  [name] ELIQUIS 5 MG TABS tablet Take 1 tablet by mouth twice daily  . furosemide (LASIX) 20 MG tablet Take 20 mg by mouth daily as needed for edema.   . gabapentin (NEURONTIN) 400 MG capsule Take 400 mg by mouth 3 (three) times daily.   . insulin glargine (LANTUS) 100 UNIT/ML injection Inject 9 Units into the skin 2 (two) times daily.   . insulin lispro (HUMALOG) 100 UNIT/ML injection Inject 2-19 Units into the skin 3 (three) times daily with meals.   [name] omega-3 acid ethyl esters (LOVAZA) 1 G capsule Take 2 g by mouth 2 (two) times daily.   [name] rOPINIRole (REQUIP) 2 MG tablet  Take 2 mg by mouth at bedtime.      Allergies:   Penicillins and Statins   Social History   Socioeconomic History  . Marital status: Married    Spouse name: Not on file  . Number of children: Not on file  . Years of education: Not on file  . Highest education level: Not on file  Occupational History  . Not on file  Tobacco Use  . Smoking status: Never Smoker  . Smokeless tobacco: Never Used  Substance and Sexual Activity  . Alcohol use: No    Alcohol/week: 0.0 standard drinks  . Drug use: No  . Sexual activity: Not on file  Other Topics Concern  . Not on file  Social History Narrative  . Not on file   Social Determinants of Health  Financial Resource Strain:   . Difficulty of Paying Living Expenses: Not on file  Food Insecurity:   . Worried About Charity fundraiser in the Last Year: Not on file  . Ran Out of Food in the Last Year: Not on file  Transportation Needs:   . Lack of Transportation (Medical): Not on file  . Lack of Transportation (Non-Medical): Not on file  Physical Activity:   . Days of Exercise per Week: Not on file  . Minutes of Exercise per Session: Not on file  Stress:   . Feeling of Stress : Not on file  Social Connections:   . Frequency of Communication with Friends and Family: Not on file  . Frequency of Social Gatherings with Friends and Family: Not on file  . Attends Religious Services: Not on file  . Active Member of Clubs or Organizations: Not on file  . Attends Archivist Meetings: Not on file  . Marital Status: Not on file     Family History: The patient's family history includes Cancer in her brother; Congestive Heart Failure in her father; Diabetes in her mother; Heart failure in her mother; Hypertension in her mother and son. ROS:   Please see the history of present illness.    All other systems reviewed and are negative.  EKGs/Labs/Other Studies Reviewed:    The following studies were reviewed today:  EKG:  EKG  06/21/2020 personally reviewed atrial paced rhythm left bundle branch block Recent Labs: 12/14/2018: ALT 10; NT-Pro BNP 2,254 07/19/2019: BUN 32; Creatinine, Ser 1.57; Hemoglobin 10.9; Platelets 249; Potassium 4.6; Sodium 142  Recent Lipid Panel No results found for: CHOL, TRIG, HDL, CHOLHDL, VLDL, LDLCALC, LDLDIRECT  Physical Exam:    VS:  BP (!) 146/68   Pulse 62   Ht 5\' 4"  (1.626 m)   Wt 244 lb (110.7 kg)   SpO2 98%   BMI 41.88 kg/m     Wt Readings from Last 3 Encounters:  12/09/19 244 lb (110.7 kg)  11/22/19 247 lb 9.6 oz (112.3 kg)  08/31/19 244 lb 6.4 oz (110.9 kg)     GEN:  Well nourished, well developed in no acute distress HEENT: Normal NECK: No JVD; No carotid bruits LYMPHATICS: No lymphadenopathy CARDIAC: RRR, no murmurs, rubs, gallops RESPIRATORY:  Clear to auscultation without rales, wheezing or rhonchi  ABDOMEN: Soft, non-tender, non-distended MUSCULOSKELETAL:  No edema; No deformity  SKIN: Warm and dry NEUROLOGIC:  Alert and oriented x 3 PSYCHIATRIC:  Normal affect    Signed, Shirlee More, MD  12/09/2019 10:05 AM    [place]

## 2019-12-09 ENCOUNTER — Other Ambulatory Visit: Payer: Self-pay

## 2019-12-09 ENCOUNTER — Ambulatory Visit (INDEPENDENT_AMBULATORY_CARE_PROVIDER_SITE_OTHER): Payer: Medicare PPO | Admitting: Cardiology

## 2019-12-09 ENCOUNTER — Ambulatory Visit: Payer: Medicare Other | Admitting: Cardiology

## 2019-12-09 ENCOUNTER — Encounter: Payer: Self-pay | Admitting: Cardiology

## 2019-12-09 VITALS — BP 146/68 | HR 62 | Ht 64.0 in | Wt 244.0 lb

## 2019-12-09 DIAGNOSIS — Z950 Presence of cardiac pacemaker: Secondary | ICD-10-CM | POA: Diagnosis not present

## 2019-12-09 DIAGNOSIS — I110 Hypertensive heart disease with heart failure: Secondary | ICD-10-CM | POA: Diagnosis not present

## 2019-12-09 DIAGNOSIS — I25119 Atherosclerotic heart disease of native coronary artery with unspecified angina pectoris: Secondary | ICD-10-CM | POA: Diagnosis not present

## 2019-12-09 DIAGNOSIS — N1832 Chronic kidney disease, stage 3b: Secondary | ICD-10-CM

## 2019-12-09 DIAGNOSIS — I495 Sick sinus syndrome: Secondary | ICD-10-CM

## 2019-12-09 DIAGNOSIS — E782 Mixed hyperlipidemia: Secondary | ICD-10-CM

## 2019-12-09 MED ORDER — PRAVASTATIN SODIUM 20 MG PO TABS: 20.0000 mg | ORAL_TABLET | Freq: Every evening | ORAL | 3 refills | Status: DC

## 2019-12-09 NOTE — Patient Instructions (Signed)
Medication Instructions:  Your physician recommends that you continue on your current medications as directed. Please refer to the Current Medication list given to you today.  *If you need a refill on your cardiac medications before your next appointment, please call your pharmacy*  Lab Work: NONE If you have labs (blood work) drawn today and your tests are completely normal, you will receive your results only by: [name] MyChart Message (if you have MyChart) OR . A paper copy in the mail If you have any lab test that is abnormal or we need to change your treatment, we will call you to review the results.  Testing/Procedures: NONE  Follow-Up: At [facility], you and your health needs are our priority.  As part of our continuing mission to provide you with exceptional heart care, we have created designated Provider Care Teams.  These Care Teams include your primary Cardiologist (physician) and Advanced Practice Providers (APPs -  Physician Assistants and Nurse Practitioners) who all work together to provide you with the care you need, when you need it.  Your next appointment:   6 month(s)  The format for your next appointment:   In Person  Provider:   Shirlee More, MD

## 2019-12-27 ENCOUNTER — Ambulatory Visit (INDEPENDENT_AMBULATORY_CARE_PROVIDER_SITE_OTHER): Payer: Medicare PPO | Admitting: *Deleted

## 2019-12-27 DIAGNOSIS — I495 Sick sinus syndrome: Secondary | ICD-10-CM | POA: Diagnosis not present

## 2019-12-27 LAB — CUP PACEART REMOTE DEVICE CHECK
Battery Remaining Longevity: 143 mo
Battery Voltage: 3.14 V
Brady Statistic AP VP Percent: 94.97 %
Brady Statistic AP VS Percent: 0.01 %
Brady Statistic AS VP Percent: 4.98 %
Brady Statistic AS VS Percent: 0.05 %
Brady Statistic RA Percent Paced: 76.81 %
Brady Statistic RV Percent Paced: 98.11 %
Date Time Interrogation Session: 20210221221524
Implantable Lead Implant Date: 20201006
Implantable Lead Implant Date: 20201006
Implantable Lead Location: 753859
Implantable Lead Location: 753860
Implantable Lead Model: 5076
Implantable Lead Model: 5076
Implantable Pulse Generator Implant Date: 20201006
Lead Channel Impedance Value: 304 Ohm
Lead Channel Impedance Value: 380 Ohm
Lead Channel Impedance Value: 437 Ohm
Lead Channel Impedance Value: 437 Ohm
Lead Channel Pacing Threshold Amplitude: 0.375 V
Lead Channel Pacing Threshold Amplitude: 0.75 V
Lead Channel Pacing Threshold Pulse Width: 0.4 ms
Lead Channel Pacing Threshold Pulse Width: 0.4 ms
Lead Channel Sensing Intrinsic Amplitude: 2.375 mV
Lead Channel Sensing Intrinsic Amplitude: 2.375 mV
Lead Channel Sensing Intrinsic Amplitude: 9.625 mV
Lead Channel Sensing Intrinsic Amplitude: 9.625 mV
Lead Channel Setting Pacing Amplitude: 1.5 V
Lead Channel Setting Pacing Amplitude: 2.5 V
Lead Channel Setting Pacing Pulse Width: 0.4 ms
Lead Channel Setting Sensing Sensitivity: 1.2 mV

## 2019-12-28 NOTE — Progress Notes (Signed)
PPM Remote  

## 2019-12-28 NOTE — Addendum Note (Signed)
Addended by: Jennette Banker on: 12/28/2019 07:13 AM   Modules accepted: Level of Service

## 2020-01-11 ENCOUNTER — Other Ambulatory Visit: Payer: Self-pay | Admitting: Cardiology

## 2020-02-09 ENCOUNTER — Ambulatory Visit (INDEPENDENT_AMBULATORY_CARE_PROVIDER_SITE_OTHER): Payer: Medicare PPO | Admitting: *Deleted

## 2020-02-09 DIAGNOSIS — I495 Sick sinus syndrome: Secondary | ICD-10-CM

## 2020-02-09 LAB — CUP PACEART REMOTE DEVICE CHECK
Battery Remaining Longevity: 135 mo
Battery Voltage: 3.11 V
Brady Statistic AP VP Percent: 91.64 %
Brady Statistic AP VS Percent: 0.01 %
Brady Statistic AS VP Percent: 8.31 %
Brady Statistic AS VS Percent: 0.04 %
Brady Statistic RA Percent Paced: 84.45 %
Brady Statistic RV Percent Paced: 98.78 %
Date Time Interrogation Session: 20210407012853
Implantable Lead Implant Date: 20201006
Implantable Lead Implant Date: 20201006
Implantable Lead Location: 753859
Implantable Lead Location: 753860
Implantable Lead Model: 5076
Implantable Lead Model: 5076
Implantable Pulse Generator Implant Date: 20201006
Lead Channel Impedance Value: 285 Ohm
Lead Channel Impedance Value: 342 Ohm
Lead Channel Impedance Value: 399 Ohm
Lead Channel Impedance Value: 418 Ohm
Lead Channel Pacing Threshold Amplitude: 0.5 V
Lead Channel Pacing Threshold Amplitude: 0.75 V
Lead Channel Pacing Threshold Pulse Width: 0.4 ms
Lead Channel Pacing Threshold Pulse Width: 0.4 ms
Lead Channel Sensing Intrinsic Amplitude: 10.25 mV
Lead Channel Sensing Intrinsic Amplitude: 10.25 mV
Lead Channel Sensing Intrinsic Amplitude: 2.375 mV
Lead Channel Sensing Intrinsic Amplitude: 2.375 mV
Lead Channel Setting Pacing Amplitude: 1.5 V
Lead Channel Setting Pacing Amplitude: 2.5 V
Lead Channel Setting Pacing Pulse Width: 0.4 ms
Lead Channel Setting Sensing Sensitivity: 1.2 mV

## 2020-02-09 NOTE — Progress Notes (Signed)
PPM Remote  

## 2020-02-16 ENCOUNTER — Telehealth: Payer: Self-pay

## 2020-02-16 NOTE — Telephone Encounter (Signed)
-----   Message from Will Meredith Leeds, MD sent at 02/11/2020 10:29 AM EDT ----- Abnormal device interrogation reviewed.  Lead parameters and battery status stable.  In AF. Needs follow up in with me or in AF clinic or primary cards.

## 2020-02-16 NOTE — Telephone Encounter (Signed)
Pt agrees to see someone for her AFIB as noted by Dr. Curt Bears from her remote device check.. pt reports she has been very tired lately but denies palpations, dizziness, sob...  She asks to be be seen in [place] but Dr. Curt Bears or any other Primary Cardiologist.   Will forward to the [facility] to call her back and schedule.

## 2020-02-18 IMAGING — DX DG CHEST 2V
2 series · 2 of 2 positions shown · non-contrast
Comparison: Radiograph September 30, 2015.

CLINICAL DATA: Status post pacemaker placement.

EXAM:
CHEST - 2 VIEW

[chest lat]
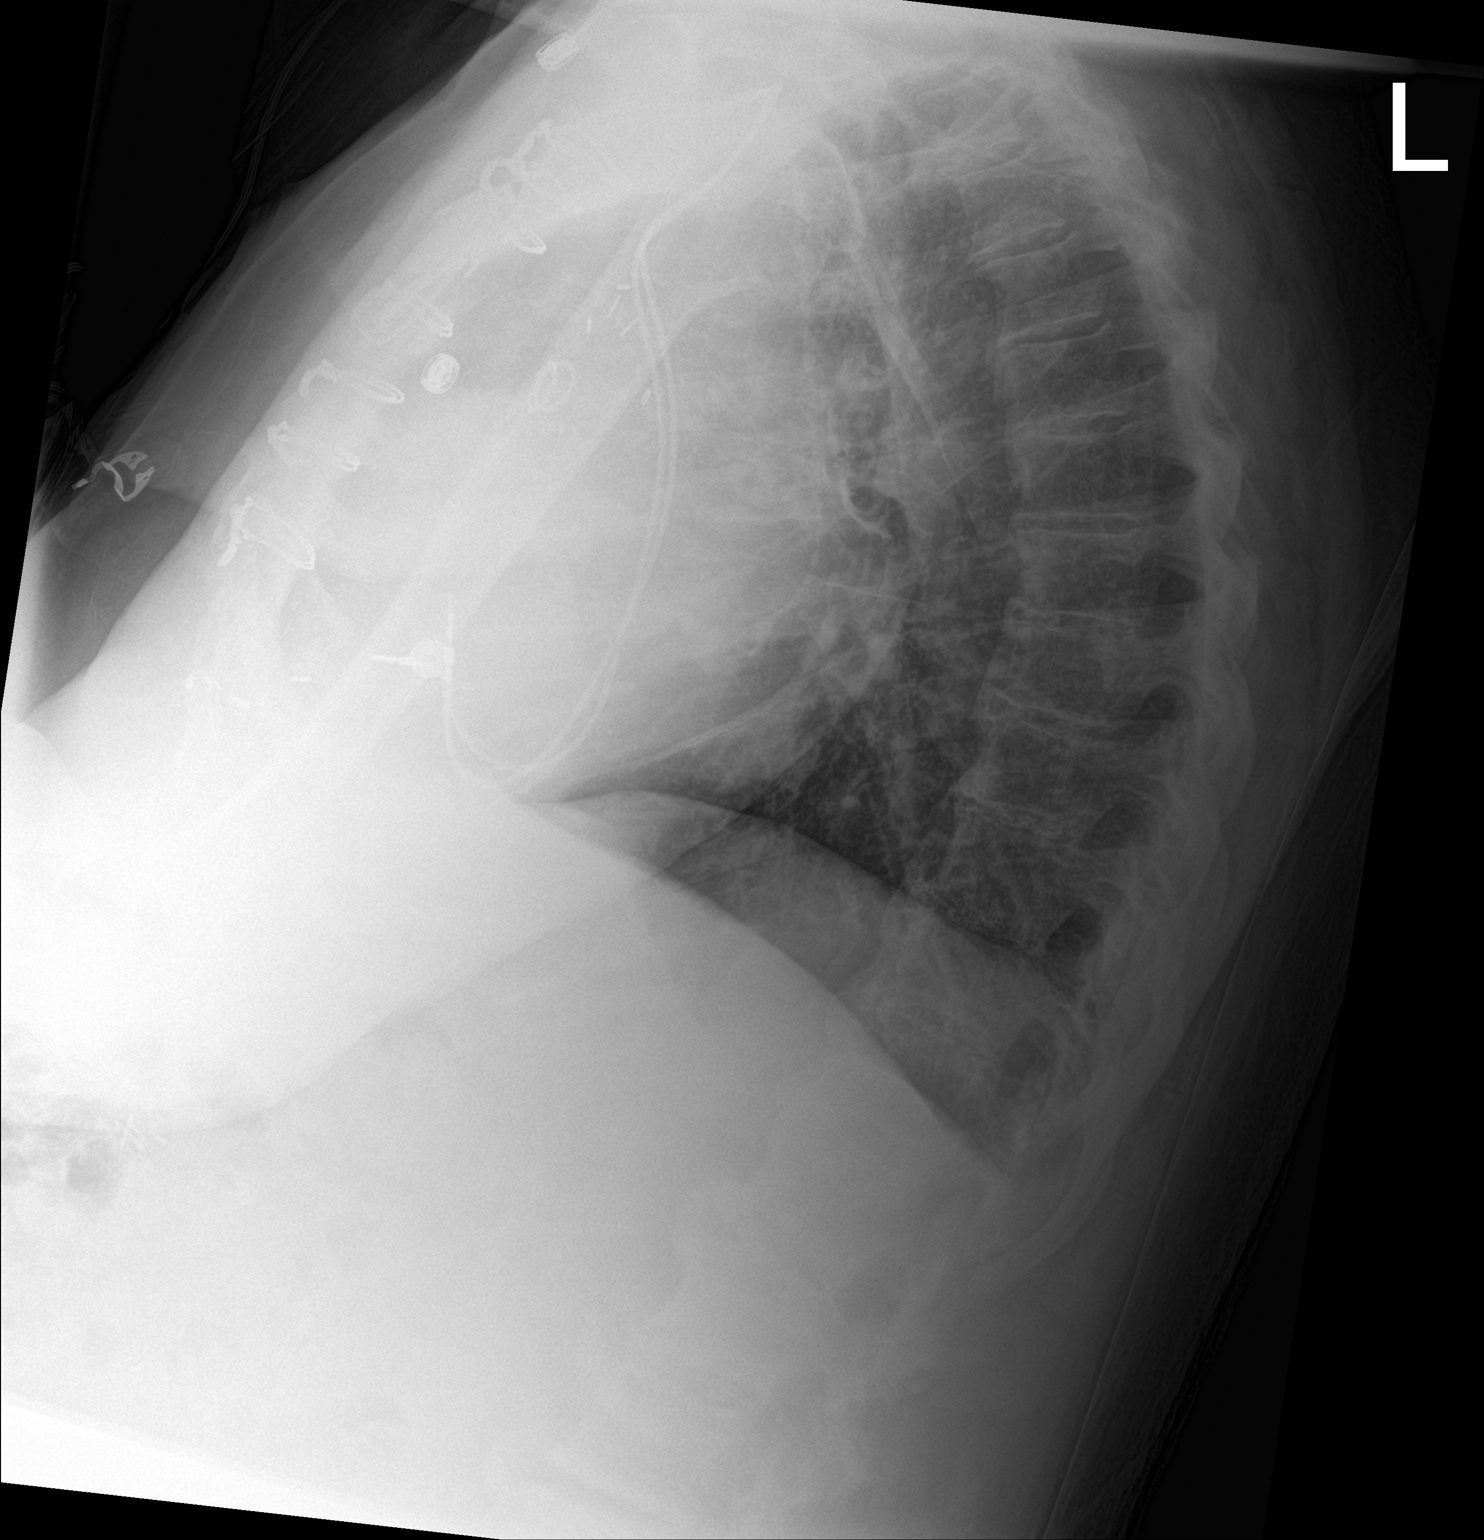

[chest ap]
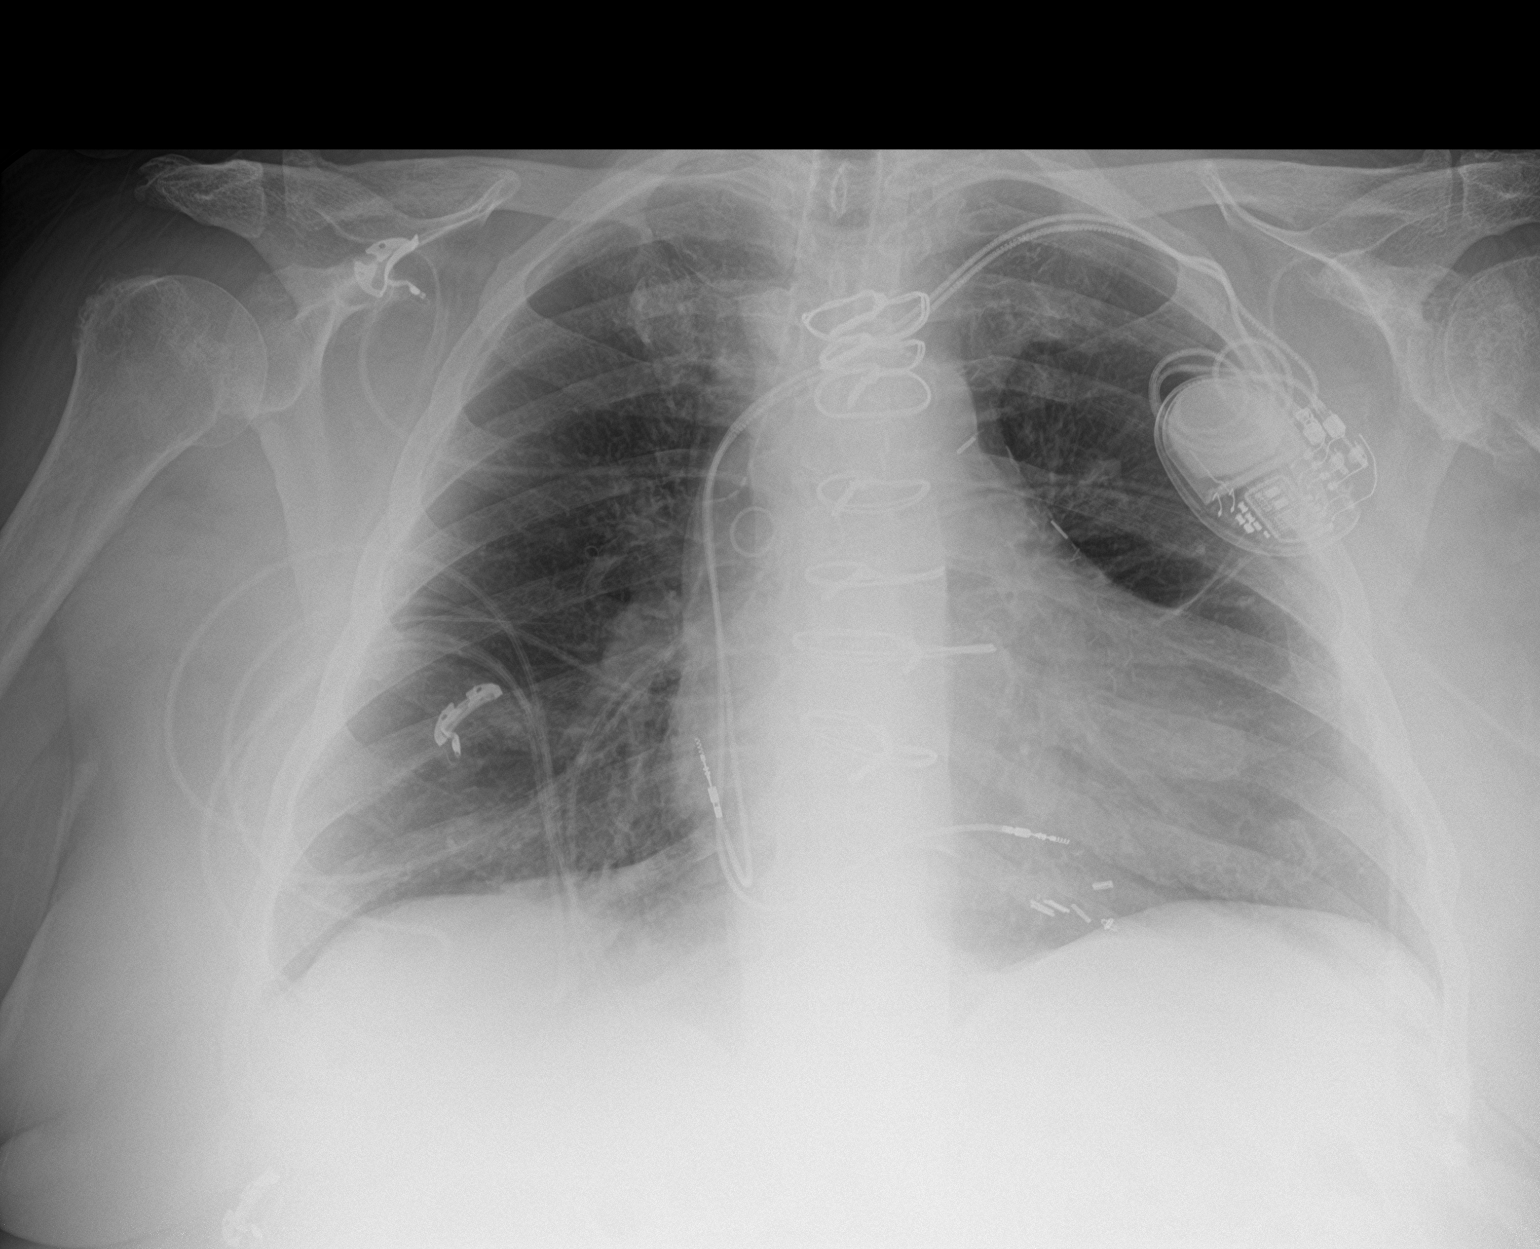

[2 of 2 positions shown; findings below may reference images not displayed]

FINDINGS: Stable cardiomediastinal silhouette. Status post coronary bypass
graft. Interval placement of left-sided pacemaker with leads in
grossly good position. No pneumothorax or pleural effusion is noted.
No acute pulmonary disease is noted. Bony thorax is unremarkable.
IMPRESSION: Interval placement of left-sided pacemaker with leads in grossly
good position. No pneumothorax is noted.

## 2020-02-28 NOTE — Progress Notes (Signed)
Cardiology Office Note:    Date:  02/29/2020   ID:  Carmen Ford, DOB December 11, 1945, MRN [id number]  PCP:  Carmen Beckwith, PA-C  Cardiologist:  Carmen More, MD    Referring MD: Carmen Ford, *    ASSESSMENT:    1. Chronic diastolic heart failure ([place])   2. Essential hypertension   3. Paroxysmal atrial fibrillation (HCC)   4. SSS (sick sinus syndrome) (HCC)   5. Cardiac pacemaker in situ   6. Hypertensive heart disease with heart failure (HCC)   7. Stage 3b chronic kidney disease   8. Coronary artery disease involving native coronary artery of native heart with angina pectoris ([place])    PLAN:    In order of problems listed above:  1. Heart failure is decompensated she is fluid overloaded noncompliant with her diuretic and I told her she needs to take it daily.  Recheck renal function with CKD and proBNP  2. At target continue current multidrug regimen and reduce amlodipine to every other day with edema 3. Asymptomatic, continue beta-blocker anticoagulant she declines an antiarrhythmic drug at this time and I do not think that is reasonable 4. Stable pacemaker sick sinus syndrome follow-up practice 5. Recheck renal function with stage III CKD 6. Check thyroid with complaints of fatigue and elevated TSH previously 7. Stable CAD having no angina on current medical therapy.  Next visit we will discuss PCSK9 inhibitor   Next appointment: 3 months   Medication Adjustments/Labs and Tests Ordered: Current medicines are reviewed at length with the patient today.  Concerns regarding medicines are outlined above.  Orders Placed This Encounter  Procedures  . EKG 12-Lead   No orders of the defined types were placed in this encounter.   Chief Complaint  Patient presents with  . Atrial Fibrillation    History of Present Illness:    Carmen Ford is a 74 y.o. female with a hx of CAD CABG  heart failure stage 4 CKD hypertension hyperlipidemia and bradycardia second  degree AV block with hyperkalemia.  She had a dual-chamber pacemaker inserted 08/10/2019 [employer] for sick sinus syndrome She was seen 08/31/2019 with poorly controlled hypertension she was resumed on carvedilol She was last seen 12/09/2019. Compliance with diet, lifestyle and medications: Yes  Her last pacemaker download showed an 8% atrial fibrillation burden she is anticoagulant Eliquis and the rates were controlled.  She is seen by me because of this last pacemaker download.  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  She acknowledges that she needs to take her diuretic daily.  She has CKD and will recheck renal function today her last TSH was elevated in 2015 and she may be hypothyroid.  She has dyslipidemia and is statin intolerant and next visit we will discuss alternative PCSK9 therapy. Past Medical History:  Diagnosis Date  . Acquired cavovarus deformity of left foot 12/24/2017  . Acute colitis 05/08/2017   Overview:  CT of the abdomen shows right colitis.  Will administer IV Flagyl and Cipro, clear liquid diet, check stool for c diff and culture.  Will recheck  lactic acid for sepsis.  Was supposed to have colonoscopy recently but has not had one for years.    Last Assessment & Plan:  CT of the abdomen obtained showed right colitis.  Tolerated clear liquid diet.  States has had no further diarrhea si  . Acute on chronic kidney failure ([place]) 05/08/2017   Last Assessment & Plan:  Patient presented to ER with diarrhea, renal panel indicated dehydration.  Reviewed past renal panels and creatinine is elevated from  baseline of about 1.5 and now 2.3. Diarrhea has resolved per patient report and she is tolerating fluids, will advance to regular diabetic, cardiac diet. Plan: Resume home lasix lasix  Bun and creatine trending down Diarrhea resolved Tolerat  . Acute renal failure superimposed on stage 4 chronic kidney disease ([place]) 05/08/2017   Last Assessment & Plan:  Recheck BMP and potassium and was trending favorably No change in meds until results known and is ok and aware to hold ARB and diuretic  . Anemia   . Anemia in chronic kidney disease 10/15/2016  . Bradycardia    Overview:  Not on a beta blocker with sinus bradycardia and Mobitz 1 second degree AV block  . Bradycardia by electrocardiography 09/30/2018   Last Assessment & Plan:  Symptomatic and not paced and got well enough to d/c--will see how it does going forward  Last Assessment & Plan:  Formatting of this note might be different from the original. To get paced--[place]  . CHF (congestive heart failure) ([place]) 02/26/2016   Last Assessment & Plan:  Formatting of this note might be different from the original. Sees cardio and will do BNP today  . Chronic diastolic heart failure ([place]) 02/26/2016   Overview:  Overview:  EF normal by echo 2013  . Chronic ischemic heart disease   . CKD (chronic kidney disease) 05/15/2017  . Colitis, acute   . Coronary artery disease    CABG  2011: LTA to LAD, SVG to M, SVG to PDA EF 40%  . Coronary artery disease involving native coronary artery of native heart with angina pectoris ([place]) 02/26/2016  . Degenerative arthritis of left knee 06/09/2017  . Diabetes mellitus without complication ([place])    type II   . Diabetic peripheral neuropathy ([place]) 12/14/2018   Last Assessment & Plan:  Sees endo They adjust her insulin  Last Assessment & Plan:  Formatting of this note might be different from the original. Sees endo  . Encounter for routine adult physical exam with abnormal findings 01/13/2017   Last Assessment & Plan:   Formatting of this note might be different from the original. Declines MMG Needs labs I have nothing and she does remember the A1C was good  No change meds and reconciled and cleaned up the problem list  6 months f/u  . Essential (primary) hypertension 05/15/2017  . Essential hypertension 05/15/2017   Last Assessment & Plan:  Do not restart ARB. Avoid beta-blockers, diltiazem, verapamil.  Start amlodipine today  Last Assessment & Plan:  Formatting of this note might be different from the original. Doing well and no changes in treatment  . Fatigue 10/15/2016  . Hammer toe 12/24/2017  . History of total knee arthroplasty 06/30/2018  . Hx of CABG 02/26/2016   Overview:  2011, LTA to LAD, SVG to M, SVG to PDA EF 40%  Formatting of this note might be different from the original. 2011, LTA to LAD, SVG to M, SVG to PDA EF 40%  . Hyperkalemia  09/30/2018   Last Assessment & Plan:  Mild.  Improved.  Continue to follow.  May have been contributing to some of her rhythm disturbances, but I agree likely not the primary cause.  [name] Hyperlipidemia 05/15/2017  . Hypertensive heart disease with heart failure ([place])   . Hypomagnesemia 05/09/2017   Last Assessment & Plan:  Magnesium level 1.7 likely secondary to diarrhea loss Plan: Replaced   . Metatarsalgia 12/24/2017  . Morbid obesity ([place]) 04/20/2019   Formatting of this note might be different from the original. Advised to work on portion control Try to remain active  . Myocardial infarction ([place])   . Osteoarthritis of left knee 06/09/2017  . Persistent atrial fibrillation ([place]) 12/11/2018  . 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 Restless legs syndrome 05/15/2017  . Rupture of left patellar tendon 07/03/2017  . SSS (sick sinus syndrome) ([place]) 08/10/2019  . Tendinopathy 12/24/2017  . Visual disturbance of one eye 09/30/2018   Last Assessment & Plan:  Formatting of this note might be different from the original. Had retinal surgery and cataract surgery and so so results  . Vitamin  D deficiency 05/15/2017    Past Surgical History:  Procedure Laterality Date  . ACHILLES TENDON SURGERY Left 2005   inserted a new tendon  . CARDIAC SURGERY    . CATARACT EXTRACTION, BILATERAL    . CHOLECYSTECTOMY  02/2016  . COLONOSCOPY    . CORONARY ARTERY BYPASS GRAFT  2011   [facility]  . KNEE ARTHROPLASTY Left 06/09/2017   Procedure: LEFT TOTAL KNEE ARTHROPLASTY WITH COMPUTER NAVIGATION;  Surgeon: Rod Can, MD;  Location: [facility];  Service: Orthopedics;  Laterality: Left;  NEED RNFA  . PACEMAKER IMPLANT N/A 08/10/2019   Procedure: PACEMAKER IMPLANT;  Surgeon: Constance Haw, MD;  Location: [facility];  Service: Cardiovascular;  Laterality: N/A;  . PATELLAR TENDON REPAIR Left 07/03/2017   Procedure: PATELLA TENDON REPAIR LEFT KNEE;  Surgeon: Rod Can, MD;  Location: [facility];  Service: Orthopedics;  Laterality: Left;  [name] VARICOSE VEIN SURGERY Bilateral 1990's    Current Medications: Current Meds  Medication Sig  . acetaminophen (TYLENOL) 325 MG tablet Take 1-2 tablets (325-650 mg total) by mouth every 4 (four) hours as needed for mild pain.  [name] amLODipine (NORVASC) 10 MG tablet Take 10 mg by mouth daily.   . carvedilol (COREG) 25 MG tablet Take 1/2 (one-half) tablet by mouth twice daily  . Cholecalciferol (EQL VITAMIN D3) 2000 units CAPS Take 2,000 mg by mouth daily.  [name] ELIQUIS 5 MG TABS tablet Take 1 tablet by mouth twice daily  . furosemide (LASIX) 20 MG tablet Take 20 mg by mouth daily as needed for edema.   . gabapentin (NEURONTIN) 400 MG capsule Take 400 mg by mouth 3 (three) times daily.   . insulin glargine (LANTUS) 100 UNIT/ML injection Inject 9 Units into the skin 2 (two) times daily.   . insulin lispro (HUMALOG) 100 UNIT/ML injection Inject 2-19 Units into the skin 3 (three) times daily with meals.   [name] omega-3 acid ethyl esters (LOVAZA) 1 G capsule Take 2 g by mouth 2 (two) times daily.   . pravastatin (PRAVACHOL) 20 MG tablet Take 1 tablet  (20 mg total) by mouth every evening.  [name] rOPINIRole (REQUIP) 2 MG tablet Take 2 mg by mouth at bedtime.   . valACYclovir (VALTREX) 1000 MG tablet      Allergies:   Penicillins and Statins   Social History   Socioeconomic History  .  Marital status: Married    Spouse name: Not on file  . Number of children: Not on file  . Years of education: Not on file  . Highest education level: Not on file  Occupational History  . Not on file  Tobacco Use  . Smoking status: Never Smoker  . Smokeless tobacco: Never Used  Substance and Sexual Activity  . Alcohol use: No    Alcohol/week: 0.0 standard drinks  . Drug use: No  . Sexual activity: Not on file  Other Topics Concern  . Not on file  Social History Narrative  . Not on file   Social Determinants of Health   Financial Resource Strain:   . Difficulty of Paying Living Expenses:   Food Insecurity:   . Worried About Charity fundraiser in the Last Year:   . Arboriculturist in the Last Year:   Transportation Needs:   . Film/video editor (Medical):   [name] Lack of Transportation (Non-Medical):   Physical Activity:   . Days of Exercise per Week:   . Minutes of Exercise per Session:   Stress:   . Feeling of Stress :   Social Connections:   . Frequency of Communication with Friends and Family:   . Frequency of Social Gatherings with Friends and Family:   . Attends Religious Services:   . Active Member of Clubs or Organizations:   . Attends Archivist Meetings:   [name] Marital Status:      Family History: The patient's family history includes Cancer in her brother; Congestive Heart Failure in her father; Diabetes in her mother; Heart failure in her mother; Hypertension in her mother and son. ROS:   Please see the history of present illness.    All other systems reviewed and are negative.  EKGs/Labs/Other Studies Reviewed:    The following studies were reviewed today:  EKG:  EKG ordered today and personally reviewed.   The ekg ordered today demonstrates dual-chamber pacemaker rhythm  Recent Labs: 07/19/2019: BUN 32; Creatinine, Ser 1.57; Hemoglobin 10.9; Platelets 249; Potassium 4.6; Sodium 142  12/02/2019: BMP with a creatinine 1.37 potassium 4.7 10/21/2019: Cholesterol 216 triglycerides 131 LDL 158 HDL 32 Physical Exam:    VS:  BP 124/60 (BP Location: Right Arm, Patient Position: Sitting, Cuff Size: Large)   Pulse 62   Ht 5\' 4"  (1.626 m)   Wt 249 lb (112.9 kg)   SpO2 97%   BMI 42.74 kg/m     Wt Readings from Last 3 Encounters:  02/29/20 249 lb (112.9 kg)  12/09/19 244 lb (110.7 kg)  11/22/19 247 lb 9.6 oz (112.3 kg)     GEN:  Well nourished, well developed in no acute distress HEENT: Normal NECK: No JVD; No carotid bruits LYMPHATICS: No lymphadenopathy CARDIAC: RRR, no murmurs, rubs, gallops RESPIRATORY:  Clear to auscultation without rales, wheezing or rhonchi  ABDOMEN: Soft, non-tender, non-distended MUSCULOSKELETAL: 2-3+ bilateral lower extremity above-the-knee edema; No deformity  SKIN: Warm and dry NEUROLOGIC:  Alert and oriented x 3 PSYCHIATRIC:  Normal affect    Signed, Carmen More, MD  02/29/2020 11:39 AM    [place]

## 2020-02-29 ENCOUNTER — Other Ambulatory Visit: Payer: Self-pay

## 2020-02-29 ENCOUNTER — Ambulatory Visit: Payer: Medicare PPO | Admitting: Cardiology

## 2020-02-29 VITALS — BP 124/60 | HR 62 | Ht 64.0 in | Wt 249.0 lb

## 2020-02-29 DIAGNOSIS — I10 Essential (primary) hypertension: Secondary | ICD-10-CM

## 2020-02-29 DIAGNOSIS — I110 Hypertensive heart disease with heart failure: Secondary | ICD-10-CM

## 2020-02-29 DIAGNOSIS — Z79899 Other long term (current) drug therapy: Secondary | ICD-10-CM

## 2020-02-29 DIAGNOSIS — I5032 Chronic diastolic (congestive) heart failure: Secondary | ICD-10-CM | POA: Diagnosis not present

## 2020-02-29 DIAGNOSIS — Z950 Presence of cardiac pacemaker: Secondary | ICD-10-CM

## 2020-02-29 DIAGNOSIS — N1832 Chronic kidney disease, stage 3b: Secondary | ICD-10-CM

## 2020-02-29 DIAGNOSIS — I25119 Atherosclerotic heart disease of native coronary artery with unspecified angina pectoris: Secondary | ICD-10-CM

## 2020-02-29 DIAGNOSIS — I480 Paroxysmal atrial fibrillation: Secondary | ICD-10-CM | POA: Diagnosis not present

## 2020-02-29 DIAGNOSIS — I495 Sick sinus syndrome: Secondary | ICD-10-CM | POA: Diagnosis not present

## 2020-02-29 NOTE — Patient Instructions (Addendum)
Medication Instructions:  Your physician has recommended you make the following change in your medication:  DECREASE: Amlodipine to every other day instead of daily.   *If you need a refill on your cardiac medications before your next appointment, please call your pharmacy*   Lab Work: Your physician recommends that you return for lab work in: TODAY CBC, BMP, TSH If you have labs (blood work) drawn today and your tests are completely normal, you will receive your results only by: [name] MyChart Message (if you have MyChart) OR . A paper copy in the mail If you have any lab test that is abnormal or we need to change your treatment, we will call you to review the results.   Testing/Procedures: None   Follow-Up: At [facility], you and your health needs are our priority.  As part of our continuing mission to provide you with exceptional heart care, we have created designated Provider Care Teams.  These Care Teams include your primary Cardiologist (physician) and Advanced Practice Providers (APPs -  Physician Assistants and Nurse Practitioners) who all work together to provide you with the care you need, when you need it.  We recommend signing up for the patient portal called "MyChart".  Sign up information is provided on this After Visit Summary.  MyChart is used to connect with patients for Virtual Visits (Telemedicine).  Patients are able to view lab/test results, encounter notes, upcoming appointments, etc.  Non-urgent messages can be sent to your provider as well.   To learn more about what you can do with MyChart, go to [email or web address].    Your next appointment:   3 month(s)  The format for your next appointment:   In Person  Provider:   Shirlee More, MD   Other Instructions

## 2020-03-01 LAB — BASIC METABOLIC PANEL
BUN/Creatinine Ratio: 16 (ref 12–28)
BUN: 25 mg/dL (ref 8–27)
CO2: 23 mmol/L (ref 20–29)
Calcium: 8.3 mg/dL — ABNORMAL LOW (ref 8.7–10.3)
Chloride: 103 mmol/L (ref 96–106)
Creatinine, Ser: 1.52 mg/dL — ABNORMAL HIGH (ref 0.57–1.00)
GFR calc Af Amer: 39 mL/min/{1.73_m2} — ABNORMAL LOW (ref >59)
GFR calc non Af Amer: 34 mL/min/{1.73_m2} — ABNORMAL LOW (ref >59)
Glucose: 103 mg/dL — ABNORMAL HIGH (ref 65–99)
Potassium: 4.8 mmol/L (ref 3.5–5.2)
Sodium: 141 mmol/L (ref 134–144)

## 2020-03-01 LAB — CBC
Hematocrit: 31 % — ABNORMAL LOW (ref 34.0–46.6)
Hemoglobin: 9.9 g/dL — ABNORMAL LOW (ref 11.1–15.9)
MCH: 28.3 pg (ref 26.6–33.0)
MCHC: 31.9 g/dL (ref 31.5–35.7)
MCV: 89 fL (ref 79–97)
Platelets: 196 10*3/uL (ref 150–450)
RBC: 3.5 x10E6/uL — ABNORMAL LOW (ref 3.77–5.28)
RDW: 14.6 % (ref 11.7–15.4)
WBC: 8.2 10*3/uL (ref 3.4–10.8)

## 2020-03-01 LAB — TSH: TSH: 1.83 u[IU]/mL (ref 0.450–4.500)

## 2020-05-10 ENCOUNTER — Ambulatory Visit (INDEPENDENT_AMBULATORY_CARE_PROVIDER_SITE_OTHER): Payer: Medicare PPO | Admitting: *Deleted

## 2020-05-10 DIAGNOSIS — I495 Sick sinus syndrome: Secondary | ICD-10-CM

## 2020-05-10 LAB — CUP PACEART REMOTE DEVICE CHECK
Battery Remaining Longevity: 128 mo
Battery Voltage: 3.05 V
Brady Statistic AP VP Percent: 89.37 %
Brady Statistic AP VS Percent: 0.01 %
Brady Statistic AS VP Percent: 10.56 %
Brady Statistic AS VS Percent: 0.06 %
Brady Statistic RA Percent Paced: 87.8 %
Brady Statistic RV Percent Paced: 99.56 %
Date Time Interrogation Session: 20210706213933
Implantable Lead Implant Date: 20201006
Implantable Lead Implant Date: 20201006
Implantable Lead Location: 753859
Implantable Lead Location: 753860
Implantable Lead Model: 5076
Implantable Lead Model: 5076
Implantable Pulse Generator Implant Date: 20201006
Lead Channel Impedance Value: 285 Ohm
Lead Channel Impedance Value: 361 Ohm
Lead Channel Impedance Value: 418 Ohm
Lead Channel Impedance Value: 437 Ohm
Lead Channel Pacing Threshold Amplitude: 0.5 V
Lead Channel Pacing Threshold Amplitude: 0.625 V
Lead Channel Pacing Threshold Pulse Width: 0.4 ms
Lead Channel Pacing Threshold Pulse Width: 0.4 ms
Lead Channel Sensing Intrinsic Amplitude: 2.25 mV
Lead Channel Sensing Intrinsic Amplitude: 2.25 mV
Lead Channel Sensing Intrinsic Amplitude: 9.375 mV
Lead Channel Sensing Intrinsic Amplitude: 9.375 mV
Lead Channel Setting Pacing Amplitude: 1.5 V
Lead Channel Setting Pacing Amplitude: 2.5 V
Lead Channel Setting Pacing Pulse Width: 0.4 ms
Lead Channel Setting Sensing Sensitivity: 1.2 mV

## 2020-05-12 NOTE — Progress Notes (Signed)
Remote pacemaker transmission.   

## 2020-05-29 NOTE — Progress Notes (Signed)
Cardiology Office Note:    Date:  05/30/2020   ID:  Carmen Ford, DOB 01/03/1946, MRN [id number]  PCP:  Dellia Beckwith, PA-C  Cardiologist:  Shirlee More, MD    Referring MD: Dellia Beckwith, *    ASSESSMENT:    1. Leg pain, bilateral   2. Cardiac pacemaker in situ   3. Paroxysmal atrial fibrillation (HCC)   4. Hypertensive heart disease with heart failure (HCC)   5. Stage 3b chronic kidney disease   6. Coronary artery disease involving native coronary artery of native heart with angina pectoris ([place])   7. Chronic anticoagulation    PLAN:    In order of problems listed above:  1. Her predominant problem is lower extremity pain and weakness differential diagnosis includes statin myopathy myositis versus PAD.  She will stop her statin ABIs lower extremity arterial duplex was ordered. 2. Stable continue to follow in her device clinic 3. Stable continue treatment including beta-blocker anticoagulant 4. Stable CKD recent labs 5. Stable CAD no angina after bypass surgery continue medical therapy but she will discontinue her statin pending arterial evaluation   Next appointment: 3 months   Medication Adjustments/Labs and Tests Ordered: Current medicines are reviewed at length with the patient today.  Concerns regarding medicines are outlined above.  Orders Placed This Encounter  Procedures  . VAS [place] LOWER EXTREMITY ARTERIAL DUPLEX  . VAS [place] ABI WITH/WO TBI   No orders of the defined types were placed in this encounter.   Chief Complaint  Patient presents with  . Follow-up    My legs hurt and are weak when I walk    History of Present Illness:    Carmen Ford is a 74 y.o. female with a hx of CAD CABG  heart failure stage 4 CKD hypertension hyperlipidemia and bradycardia second degree AV block with hyperkalemia.  She had a dual-chamber pacemaker inserted 08/10/2019 [employer] for sick sinus syndrome. She was last seen 02/29/2020 with decompensated heart  failure. Compliance with diet, lifestyle and medications: Yes  Her predominant complaint is leg pain recent labs from June showed normal potassium calcium she is on a statin will discontinue and she shows me a note from [place] [facility] showing they did some test at home and plan she had peripheral arterial disease not ABIs.  She certainly is at risk we will have ABIs in the office and lower extremity arterial duplex.  She remains short of breath but she is not volume overloaded blood pressure is at target she tolerates her anticoagulant without bleeding and is having no anginal discomfort.  Pacemaker device function is normal followed in our clinic Past Medical History:  Diagnosis Date  . Acquired cavovarus deformity of left foot 12/24/2017  . Acute colitis 05/08/2017   Overview:  CT of the abdomen shows right colitis.  Will administer IV Flagyl and Cipro, clear liquid diet, check stool for c diff and culture.  Will recheck lactic acid for sepsis.  Was supposed to have colonoscopy recently but has not had one for years.    Last Assessment & Plan:  CT of the abdomen obtained showed right colitis.  Tolerated clear liquid diet.  States has had no further diarrhea si  . Acute on chronic kidney failure ([place]) 05/08/2017   Last Assessment & Plan:  Patient presented to ER with diarrhea, renal panel indicated dehydration.  Reviewed past renal panels and creatinine is elevated from baseline of about 1.5 and now 2.3. Diarrhea has resolved per  patient report and she is tolerating fluids, will advance to regular diabetic, cardiac diet. Plan: Resume home lasix lasix  Bun and creatine trending down Diarrhea resolved Tolerat  . Acute renal failure superimposed on stage 4 chronic kidney disease ([place]) 05/08/2017   Last Assessment & Plan:  Recheck BMP and potassium and was trending favorably No change in meds until results known and is ok and aware to hold ARB and diuretic  . Anemia   . Anemia in chronic kidney disease  10/15/2016  . Bradycardia    Overview:  Not on a beta blocker with sinus bradycardia and Mobitz 1 second degree AV block  . Bradycardia by electrocardiography 09/30/2018   Last Assessment & Plan:  Symptomatic and not paced and got well enough to d/c--will see how it does going forward  Last Assessment & Plan:  Formatting of this note might be different from the original. To get paced--[place]  . CHF (congestive heart failure) ([place]) 02/26/2016   Last Assessment & Plan:  Formatting of this note might be different from the original. Sees cardio and will do BNP today  . Chronic diastolic heart failure ([place]) 02/26/2016   Overview:  Overview:  EF normal by echo 2013  . Chronic ischemic heart disease   . CKD (chronic kidney disease) 05/15/2017  . Colitis, acute   . Coronary artery disease    CABG  2011: LTA to LAD, SVG to M, SVG to PDA EF 40%  . Coronary artery disease involving native coronary artery of native heart with angina pectoris ([place]) 02/26/2016  . Degenerative arthritis of left knee 06/09/2017  . Diabetes mellitus without complication ([place])    type II   . Diabetic peripheral neuropathy ([place]) 12/14/2018   Last Assessment & Plan:  Sees endo They adjust her insulin  Last Assessment & Plan:  Formatting of this note might be different from the original. Sees endo  . Encounter for routine adult physical exam with abnormal findings 01/13/2017   Last Assessment & Plan:  Formatting of this note might be different from the original. Declines MMG Needs labs I have nothing and she does remember the A1C was good  No change meds and reconciled and cleaned up the problem list  6 months f/u  . Essential (primary) hypertension 05/15/2017  . Essential hypertension 05/15/2017   Last Assessment & Plan:  Do not restart ARB. Avoid beta-blockers, diltiazem, verapamil.  Start amlodipine today  Last Assessment & Plan:  Formatting of this note might be different from the original. Doing well and no changes in treatment    . Fatigue 10/15/2016  . Hammer toe 12/24/2017  . History of total knee arthroplasty 06/30/2018  . Hx of CABG 02/26/2016   Overview:  2011, LTA to LAD, SVG to M, SVG to PDA EF 40%  Formatting of this note might be different from the original. 2011, LTA to LAD, SVG to M, SVG to PDA EF 40%  . Hyperkalemia 09/30/2018   Last Assessment & Plan:  Mild.  Improved.  Continue to follow.  May have been contributing to some of her rhythm disturbances, but I agree likely not the primary cause.  [name] Hyperlipidemia 05/15/2017  . Hypertensive heart disease with heart failure ([place])   . Hypomagnesemia 05/09/2017   Last Assessment & Plan:  Magnesium level 1.7 likely secondary to diarrhea loss Plan: Replaced   . Metatarsalgia 12/24/2017  . Morbid obesity ([place]) 04/20/2019   Formatting of this note might be different from the original. Advised  to work on portion control Try to remain active  . Myocardial infarction ([place])   . Osteoarthritis of left knee 06/09/2017  . Persistent atrial fibrillation ([place]) 12/11/2018  . 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 Restless legs syndrome 05/15/2017  . Rupture of left patellar tendon 07/03/2017  . SSS (sick sinus syndrome) ([place]) 08/10/2019  . Tendinopathy 12/24/2017  . Visual disturbance of one eye 09/30/2018   Last Assessment & Plan:  Formatting of this note might be different from the original. Had retinal surgery and cataract surgery and so so results  . Vitamin D deficiency 05/15/2017    Past Surgical History:  Procedure Laterality Date  . ACHILLES TENDON SURGERY Left 2005   inserted a new tendon  . CARDIAC SURGERY    . CATARACT EXTRACTION, BILATERAL    . CHOLECYSTECTOMY  02/2016  . COLONOSCOPY    . CORONARY ARTERY BYPASS GRAFT  2011   [facility]  . KNEE ARTHROPLASTY Left 06/09/2017   Procedure: LEFT TOTAL KNEE ARTHROPLASTY WITH COMPUTER NAVIGATION;  Surgeon: Rod Can, MD;  Location: [facility];  Service: Orthopedics;  Laterality: Left;  NEED RNFA  . PACEMAKER IMPLANT N/A  08/10/2019   Procedure: PACEMAKER IMPLANT;  Surgeon: Constance Haw, MD;  Location: [facility];  Service: Cardiovascular;  Laterality: N/A;  . PATELLAR TENDON REPAIR Left 07/03/2017   Procedure: PATELLA TENDON REPAIR LEFT KNEE;  Surgeon: Rod Can, MD;  Location: [facility];  Service: Orthopedics;  Laterality: Left;  [name] VARICOSE VEIN SURGERY Bilateral 1990's    Current Medications: Current Meds  Medication Sig  . acetaminophen (TYLENOL) 325 MG tablet Take 1-2 tablets (325-650 mg total) by mouth every 4 (four) hours as needed for mild pain.  [name] amLODipine (NORVASC) 10 MG tablet Take 10 mg by mouth every other day.  . carvedilol (COREG) 25 MG tablet Take 1/2 (one-half) tablet by mouth twice daily  . Cholecalciferol (EQL VITAMIN D3) 2000 units CAPS Take 2,000 mg by mouth daily.  [name] ELIQUIS 5 MG TABS tablet Take 1 tablet by mouth twice daily  . furosemide (LASIX) 20 MG tablet Take 20 mg by mouth daily as needed for edema.   . gabapentin (NEURONTIN) 400 MG capsule Take 400 mg by mouth 3 (three) times daily.   . insulin glargine (LANTUS) 100 UNIT/ML injection Inject 9 Units into the skin 2 (two) times daily.   . insulin lispro (HUMALOG) 100 UNIT/ML injection Inject 2-19 Units into the skin 3 (three) times daily with meals.   [name] omega-3 acid ethyl esters (LOVAZA) 1 G capsule Take 2 g by mouth 2 (two) times daily.   [name] rOPINIRole (REQUIP) 2 MG tablet Take 2 mg by mouth at bedtime.   . valACYclovir (VALTREX) 1000 MG tablet      Allergies:   Penicillins and Statins   Social History   Socioeconomic History  . Marital status: Married    Spouse name: Not on file  . Number of children: Not on file  . Years of education: Not on file  . Highest education level: Not on file  Occupational History  . Not on file  Tobacco Use  . Smoking status: Never Smoker  . Smokeless tobacco: Never Used  Vaping Use  . Vaping Use: Never used  Substance and Sexual Activity  . Alcohol use: No     Alcohol/week: 0.0 standard drinks  . Drug use: No  . Sexual activity: Not on file  Other Topics Concern  . Not on file  Social History  Narrative  . Not on file   Social Determinants of Health   Financial Resource Strain:   . Difficulty of Paying Living Expenses:   Food Insecurity:   . Worried About Charity fundraiser in the Last Year:   . Arboriculturist in the Last Year:   Transportation Needs:   . Film/video editor (Medical):   [name] Lack of Transportation (Non-Medical):   Physical Activity:   . Days of Exercise per Week:   . Minutes of Exercise per Session:   Stress:   . Feeling of Stress :   Social Connections:   . Frequency of Communication with Friends and Family:   . Frequency of Social Gatherings with Friends and Family:   . Attends Religious Services:   . Active Member of Clubs or Organizations:   . Attends Archivist Meetings:   [name] Marital Status:      Family History: The patient's family history includes Cancer in her brother; Congestive Heart Failure in her father; Diabetes in her mother; Heart failure in her mother; Hypertension in her mother and son. ROS:   Please see the history of present illness.    All other systems reviewed and are negative.  EKGs/Labs/Other Studies Reviewed:    The following studies were reviewed today:  Recent Labs: 02/29/2020: BUN 25; Creatinine, Ser 1.52; Hemoglobin 9.9; Platelets 196; Potassium 4.8; Sodium 141; TSH 1.830  Recent Lipid Panel No results found for: CHOL, TRIG, HDL, CHOLHDL, VLDL, LDLCALC, LDLDIRECT  Physical Exam:    VS:  BP 128/70 (BP Location: Right Arm, Patient Position: Sitting, Cuff Size: Normal)   Pulse 71   Ht 5\' 4"  (1.626 m)   Wt (!) 249 lb 6.4 oz (113.1 kg)   SpO2 93%   BMI 42.81 kg/m     Wt Readings from Last 3 Encounters:  05/30/20 (!) 249 lb 6.4 oz (113.1 kg)  02/29/20 249 lb (112.9 kg)  12/09/19 244 lb (110.7 kg)     GEN:  Well nourished, well developed in no acute  distress HEENT: Normal NECK: No JVD; No carotid bruits LYMPHATICS: No lymphadenopathy CARDIAC: RRR, no murmurs, rubs, gallops RESPIRATORY:  Clear to auscultation without rales, wheezing or rhonchi  ABDOMEN: Soft, non-tender, non-distended MUSCULOSKELETAL:  No edema; No deformity  SKIN: Warm and dry NEUROLOGIC:  Alert and oriented x 3 PSYCHIATRIC:  Normal affect    Signed, Shirlee More, MD  05/30/2020 9:32 AM    [place]

## 2020-05-30 ENCOUNTER — Ambulatory Visit: Payer: Medicare PPO | Admitting: Cardiology

## 2020-05-30 ENCOUNTER — Encounter: Payer: Self-pay | Admitting: Cardiology

## 2020-05-30 ENCOUNTER — Other Ambulatory Visit: Payer: Self-pay

## 2020-05-30 VITALS — BP 128/70 | HR 71 | Ht 64.0 in | Wt 249.4 lb

## 2020-05-30 DIAGNOSIS — I25119 Atherosclerotic heart disease of native coronary artery with unspecified angina pectoris: Secondary | ICD-10-CM

## 2020-05-30 DIAGNOSIS — M79604 Pain in right leg: Secondary | ICD-10-CM

## 2020-05-30 DIAGNOSIS — Z950 Presence of cardiac pacemaker: Secondary | ICD-10-CM

## 2020-05-30 DIAGNOSIS — Z7901 Long term (current) use of anticoagulants: Secondary | ICD-10-CM

## 2020-05-30 DIAGNOSIS — M79605 Pain in left leg: Secondary | ICD-10-CM

## 2020-05-30 DIAGNOSIS — I110 Hypertensive heart disease with heart failure: Secondary | ICD-10-CM | POA: Diagnosis not present

## 2020-05-30 DIAGNOSIS — N1832 Chronic kidney disease, stage 3b: Secondary | ICD-10-CM

## 2020-05-30 DIAGNOSIS — I480 Paroxysmal atrial fibrillation: Secondary | ICD-10-CM | POA: Diagnosis not present

## 2020-05-30 NOTE — Patient Instructions (Signed)
Medication Instructions:  Your physician has recommended you make the following change in your medication:  STOP: Pravastatin  *If you need a refill on your cardiac medications before your next appointment, please call your pharmacy*   Lab Work: None If you have labs (blood work) drawn today and your tests are completely normal, you will receive your results only by: [name] MyChart Message (if you have MyChart) OR . A paper copy in the mail If you have any lab test that is abnormal or we need to change your treatment, we will call you to review the results.   Testing/Procedures: Your physician has requested that you have a lower or upper extremity arterial duplex. This test is an ultrasound of the arteries in the legs or arms. It looks at arterial blood flow in the legs and arms. Allow one hour for Lower and Upper Arterial scans. There are no restrictions or special instructions  Your physician has requested that you have an ankle brachial index (ABI). During this test an ultrasound and blood pressure cuff are used to evaluate the arteries that supply the arms and legs with blood. Allow thirty minutes for this exam. There are no restrictions or special instructions.        Follow-Up: At [facility], you and your health needs are our priority.  As part of our continuing mission to provide you with exceptional heart care, we have created designated Provider Care Teams.  These Care Teams include your primary Cardiologist (physician) and Advanced Practice Providers (APPs -  Physician Assistants and Nurse Practitioners) who all work together to provide you with the care you need, when you need it.  We recommend signing up for the patient portal called "MyChart".  Sign up information is provided on this After Visit Summary.  MyChart is used to connect with patients for Virtual Visits (Telemedicine).  Patients are able to view lab/test results, encounter notes, upcoming appointments, etc.   Non-urgent messages can be sent to your provider as well.   To learn more about what you can do with MyChart, go to [email or web address].    Your next appointment:   3 month(s)  The format for your next appointment:   In Person  Provider:   Shirlee More, MD   Other Instructions

## 2020-06-20 ENCOUNTER — Other Ambulatory Visit: Payer: Self-pay

## 2020-06-20 ENCOUNTER — Telehealth: Payer: Self-pay

## 2020-06-20 ENCOUNTER — Ambulatory Visit (INDEPENDENT_AMBULATORY_CARE_PROVIDER_SITE_OTHER): Payer: Medicare PPO

## 2020-06-20 DIAGNOSIS — M79604 Pain in right leg: Secondary | ICD-10-CM

## 2020-06-20 DIAGNOSIS — M79605 Pain in left leg: Secondary | ICD-10-CM | POA: Diagnosis not present

## 2020-06-20 DIAGNOSIS — I259 Chronic ischemic heart disease, unspecified: Secondary | ICD-10-CM

## 2020-06-20 NOTE — Progress Notes (Signed)
Lower extremity arterial duplex and lower extremity ABI exams have been performed.  Jimmy Kamali Nephew RDCS, RVT

## 2020-06-20 NOTE — Addendum Note (Signed)
Addended by: Resa Miner I on: 06/20/2020 03:57 PM   Modules accepted: Orders

## 2020-06-20 NOTE — Telephone Encounter (Signed)
Patient returning call.

## 2020-06-20 NOTE — Telephone Encounter (Signed)
Spoke with patient regarding results and recommendation.  Patient verbalizes understanding and is agreeable to plan of care. Advised patient to call back with any issues or concerns.  

## 2020-06-20 NOTE — Telephone Encounter (Signed)
Left message on patients voicemail to please return our call.   

## 2020-07-05 ENCOUNTER — Ambulatory Visit: Payer: Medicare PPO | Admitting: Cardiology

## 2020-07-14 ENCOUNTER — Other Ambulatory Visit: Payer: Self-pay

## 2020-07-14 ENCOUNTER — Ambulatory Visit (INDEPENDENT_AMBULATORY_CARE_PROVIDER_SITE_OTHER): Payer: Medicare PPO

## 2020-07-14 DIAGNOSIS — I259 Chronic ischemic heart disease, unspecified: Secondary | ICD-10-CM | POA: Diagnosis not present

## 2020-07-14 NOTE — Progress Notes (Signed)
Aortic duplex exam performed.  Jimmy Kitara Hebb RDCS, RVT

## 2020-07-18 ENCOUNTER — Telehealth: Payer: Self-pay

## 2020-07-18 NOTE — Telephone Encounter (Signed)
Spoke with patient regarding results and recommendation.  Patient verbalizes understanding and is agreeable to plan of care. Advised patient to call back with any issues or concerns.  

## 2020-07-18 NOTE — Telephone Encounter (Signed)
-----   Message from Richardo Priest, MD sent at 07/18/2020  8:12 AM EDT ----- Good result no severe blockage aorta iliac arteries

## 2020-08-09 ENCOUNTER — Ambulatory Visit (INDEPENDENT_AMBULATORY_CARE_PROVIDER_SITE_OTHER): Payer: Medicare PPO

## 2020-08-09 DIAGNOSIS — I495 Sick sinus syndrome: Secondary | ICD-10-CM | POA: Diagnosis not present

## 2020-08-10 LAB — CUP PACEART REMOTE DEVICE CHECK
Battery Remaining Longevity: 121 mo
Battery Voltage: 3.03 V
Brady Statistic AP VP Percent: 91.67 %
Brady Statistic AP VS Percent: 0.01 %
Brady Statistic AS VP Percent: 8.25 %
Brady Statistic AS VS Percent: 0.08 %
Brady Statistic RA Percent Paced: 89.14 %
Brady Statistic RV Percent Paced: 99.4 %
Date Time Interrogation Session: 20211005210235
Implantable Lead Implant Date: 20201006
Implantable Lead Implant Date: 20201006
Implantable Lead Location: 753859
Implantable Lead Location: 753860
Implantable Lead Model: 5076
Implantable Lead Model: 5076
Implantable Pulse Generator Implant Date: 20201006
Lead Channel Impedance Value: 285 Ohm
Lead Channel Impedance Value: 361 Ohm
Lead Channel Impedance Value: 380 Ohm
Lead Channel Impedance Value: 418 Ohm
Lead Channel Pacing Threshold Amplitude: 0.5 V
Lead Channel Pacing Threshold Amplitude: 0.625 V
Lead Channel Pacing Threshold Pulse Width: 0.4 ms
Lead Channel Pacing Threshold Pulse Width: 0.4 ms
Lead Channel Sensing Intrinsic Amplitude: 2.375 mV
Lead Channel Sensing Intrinsic Amplitude: 2.375 mV
Lead Channel Sensing Intrinsic Amplitude: 9.25 mV
Lead Channel Sensing Intrinsic Amplitude: 9.25 mV
Lead Channel Setting Pacing Amplitude: 1.5 V
Lead Channel Setting Pacing Amplitude: 2.5 V
Lead Channel Setting Pacing Pulse Width: 0.4 ms
Lead Channel Setting Sensing Sensitivity: 1.2 mV

## 2020-08-14 NOTE — Progress Notes (Signed)
Remote pacemaker transmission.   

## 2020-09-05 ENCOUNTER — Ambulatory Visit: Payer: Medicare PPO | Admitting: Cardiology

## 2020-09-18 DIAGNOSIS — I219 Acute myocardial infarction, unspecified: Secondary | ICD-10-CM | POA: Insufficient documentation

## 2020-09-18 DIAGNOSIS — E119 Type 2 diabetes mellitus without complications: Secondary | ICD-10-CM | POA: Insufficient documentation

## 2020-09-18 DIAGNOSIS — K529 Noninfective gastroenteritis and colitis, unspecified: Secondary | ICD-10-CM | POA: Insufficient documentation

## 2020-09-18 DIAGNOSIS — I2510 Atherosclerotic heart disease of native coronary artery without angina pectoris: Secondary | ICD-10-CM | POA: Insufficient documentation

## 2020-09-18 DIAGNOSIS — I110 Hypertensive heart disease with heart failure: Secondary | ICD-10-CM | POA: Insufficient documentation

## 2020-09-18 DIAGNOSIS — D649 Anemia, unspecified: Secondary | ICD-10-CM | POA: Insufficient documentation

## 2020-09-20 NOTE — Progress Notes (Signed)
Cardiology Office Note:    Date:  09/21/2020   ID:  Carmen Ford, DOB 30-Apr-1946, MRN [id number]  PCP:  Dellia Beckwith, PA-C  Cardiologist:  Shirlee More, MD    Referring MD: Dellia Beckwith, *    ASSESSMENT:    1. Coronary artery disease involving native coronary artery of native heart with angina pectoris ([place])   2. Hypertensive heart disease with heart failure (HCC)   3. Stage 3b chronic kidney disease (HCC)   4. Paroxysmal atrial fibrillation ([place])   5. Chronic anticoagulation   6. Cardiac pacemaker in situ   7. Mixed hyperlipidemia    PLAN:    In order of problems listed above:  1. Stable CAD after surgical revascularization having no angina on current medical therapy continue beta-blocker long-acting calcium channel blocker and lipid-lowering treatment. At this time does not need ischemia evaluation 2. BP at target heart failure compensated continue her current diuretic 3. Continue anticoagulant with atrial fibrillation 4. Stable pacemaker function she has very low AF burden she is pacemaker dependent 5. She is statin intolerant presently is taking prescription Lovaza she will have labs done in December PCP which include a lipid profile CMP and proBNP to my office   Next appointment: 6 months   Medication Adjustments/Labs and Tests Ordered: Current medicines are reviewed at length with the patient today.  Concerns regarding medicines are outlined above.  No orders of the defined types were placed in this encounter.  No orders of the defined types were placed in this encounter.   Chief Complaint  Patient presents with  . Follow-up  . Congestive Heart Failure  . Coronary Artery Disease  . Atrial Fibrillation    History of Present Illness:    Carmen Ford is a 74 y.o. female with a hx of CAD with CABG chronic diastolic heart failure with hypertension stage IV CKD hyperlipidemia second-degree AV block hyperkalemia and dual-chamber pacemaker.   Last seen 05/30/2020.  She complains of leg pain with normal ABI bilaterally.  Duplex abdominal aorta showed no aneurysm she had bilateral less than 50% common iliac artery stenosis by velocity criteria.  Recent device check 08/08/2000 shows 2.8% atrial fibrillation burden normal lead parameters device function she is ventricularly paced close to 100% of the time. Compliance with diet, lifestyle and medications: Yes  I reviewed her vascular studies with her her symptoms are diabetic neuropathy She has had no palpitation or bleeding from her anticoagulant. No chest pain shortness of breath orthopnea and no edema on her current diuretic  Most recent labs in Care Everywhere 04/20/2020: Cholesterol 157 LDL 100 triglycerides 108 HDL 35 Creatinine 1.40 GFR 37 cc potassium 4.5 normal liver function test  Past Medical History:  Diagnosis Date  . Acquired cavovarus deformity of left foot 12/24/2017  . Acute colitis 05/08/2017   Overview:  CT of the abdomen shows right colitis.  Will administer IV Flagyl and Cipro, clear liquid diet, check stool for c diff and culture.  Will recheck lactic acid for sepsis.  Was supposed to have colonoscopy recently but has not had one for years.    Last Assessment & Plan:  CT of the abdomen obtained showed right colitis.  Tolerated clear liquid diet.  States has had no further diarrhea si  . Acute on chronic kidney failure ([place]) 05/08/2017   Last Assessment & Plan:  Patient presented to ER with diarrhea, renal panel indicated dehydration.  Reviewed past renal panels and creatinine is elevated from baseline of  about 1.5 and now 2.3. Diarrhea has resolved per patient report and she is tolerating fluids, will advance to regular diabetic, cardiac diet. Plan: Resume home lasix lasix  Bun and creatine trending down Diarrhea resolved Tolerat  . Acute renal failure superimposed on stage 4 chronic kidney disease ([place]) 05/08/2017   Last Assessment & Plan:  Recheck BMP and potassium and was  trending favorably No change in meds until results known and is ok and aware to hold ARB and diuretic  . Anemia   . Anemia in chronic kidney disease 10/15/2016  . Bradycardia    Overview:  Not on a beta blocker with sinus bradycardia and Mobitz 1 second degree AV block  . Bradycardia by electrocardiography 09/30/2018   Last Assessment & Plan:  Symptomatic and not paced and got well enough to d/c--will see how it does going forward  Last Assessment & Plan:  Formatting of this note might be different from the original. To get paced--[place]  . CHF (congestive heart failure) ([place]) 02/26/2016   Last Assessment & Plan:  Formatting of this note might be different from the original. Sees cardio and will do BNP today  . Chronic diastolic heart failure ([place]) 02/26/2016   Overview:  Overview:  EF normal by echo 2013  . Chronic ischemic heart disease   . CKD (chronic kidney disease) 05/15/2017  . Colitis, acute   . Coronary artery disease    CABG  2011: LTA to LAD, SVG to M, SVG to PDA EF 40%  . Coronary artery disease involving native coronary artery of native heart with angina pectoris ([place]) 02/26/2016  . Degenerative arthritis of left knee 06/09/2017  . Diabetes mellitus without complication ([place])    type II   . Diabetic peripheral neuropathy ([place]) 12/14/2018   Last Assessment & Plan:  Sees endo They adjust her insulin  Last Assessment & Plan:  Formatting of this note might be different from the original. Sees endo  . Encounter for routine adult physical exam with abnormal findings 01/13/2017   Last Assessment & Plan:  Formatting of this note might be different from the original. Declines MMG Needs labs I have nothing and she does remember the A1C was good  No change meds and reconciled and cleaned up the problem list  6 months f/u  . Essential (primary) hypertension 05/15/2017  . Essential hypertension 05/15/2017   Last Assessment & Plan:  Do not restart ARB. Avoid beta-blockers, diltiazem, verapamil.   Start amlodipine today  Last Assessment & Plan:  Formatting of this note might be different from the original. Doing well and no changes in treatment  . Fatigue 10/15/2016  . Hammer toe 12/24/2017  . History of total knee arthroplasty 06/30/2018  . Hx of CABG 02/26/2016   Overview:  2011, LTA to LAD, SVG to M, SVG to PDA EF 40%  Formatting of this note might be different from the original. 2011, LTA to LAD, SVG to M, SVG to PDA EF 40%  . Hyperkalemia 09/30/2018   Last Assessment & Plan:  Mild.  Improved.  Continue to follow.  May have been contributing to some of her rhythm disturbances, but I agree likely not the primary cause.  [name] Hyperlipidemia 05/15/2017  . Hypertensive heart disease with heart failure ([place])   . Hypomagnesemia 05/09/2017   Last Assessment & Plan:  Magnesium level 1.7 likely secondary to diarrhea loss Plan: Replaced   . Metatarsalgia 12/24/2017  . Morbid obesity ([place]) 04/20/2019   Formatting of this  note might be different from the original. Advised to work on portion control Try to remain active  . Myocardial infarction ([place])   . Osteoarthritis of left knee 06/09/2017  . Persistent atrial fibrillation ([place]) 12/11/2018  . 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 Restless legs syndrome 05/15/2017  . Rupture of left patellar tendon 07/03/2017  . SSS (sick sinus syndrome) ([place]) 08/10/2019  . Tendinopathy 12/24/2017  . Visual disturbance of one eye 09/30/2018   Last Assessment & Plan:  Formatting of this note might be different from the original. Had retinal surgery and cataract surgery and so so results  . Vitamin D deficiency 05/15/2017    Past Surgical History:  Procedure Laterality Date  . ACHILLES TENDON SURGERY Left 2005   inserted a new tendon  . CARDIAC SURGERY    . CATARACT EXTRACTION, BILATERAL    . CHOLECYSTECTOMY  02/2016  . COLONOSCOPY    . CORONARY ARTERY BYPASS GRAFT  2011   [facility]  . KNEE ARTHROPLASTY Left 06/09/2017   Procedure: LEFT TOTAL KNEE ARTHROPLASTY  WITH COMPUTER NAVIGATION;  Surgeon: Rod Can, MD;  Location: [facility];  Service: Orthopedics;  Laterality: Left;  NEED RNFA  . PACEMAKER IMPLANT N/A 08/10/2019   Procedure: PACEMAKER IMPLANT;  Surgeon: Constance Haw, MD;  Location: [facility];  Service: Cardiovascular;  Laterality: N/A;  . PATELLAR TENDON REPAIR Left 07/03/2017   Procedure: PATELLA TENDON REPAIR LEFT KNEE;  Surgeon: Rod Can, MD;  Location: [facility];  Service: Orthopedics;  Laterality: Left;  [name] VARICOSE VEIN SURGERY Bilateral 1990's    Current Medications: Current Meds  Medication Sig  . acetaminophen (TYLENOL) 325 MG tablet Take 1-2 tablets (325-650 mg total) by mouth every 4 (four) hours as needed for mild pain.  [name] amLODipine (NORVASC) 10 MG tablet Take 10 mg by mouth every other day.  . carvedilol (COREG) 25 MG tablet Take 1/2 (one-half) tablet by mouth twice daily  . Cholecalciferol (EQL VITAMIN D3) 2000 units CAPS Take 2,000 mg by mouth daily.  [name] ELIQUIS 5 MG TABS tablet Take 1 tablet by mouth twice daily  . furosemide (LASIX) 20 MG tablet Take 20 mg by mouth daily as needed for edema.   . gabapentin (NEURONTIN) 400 MG capsule Take 400 mg by mouth 3 (three) times daily.   . insulin glargine (LANTUS) 100 UNIT/ML injection Inject 9 Units into the skin 2 (two) times daily.   . insulin lispro (HUMALOG) 100 UNIT/ML injection Inject 2-19 Units into the skin 3 (three) times daily with meals.   [name] omega-3 acid ethyl esters (LOVAZA) 1 G capsule Take 2 g by mouth 2 (two) times daily.   [name] rOPINIRole (REQUIP) 2 MG tablet Take 2 mg by mouth at bedtime.   . valACYclovir (VALTREX) 1000 MG tablet      Allergies:   Penicillins and Statins   Social History   Socioeconomic History  . Marital status: Married    Spouse name: Not on file  . Number of children: Not on file  . Years of education: Not on file  . Highest education level: Not on file  Occupational History  . Not on file  Tobacco Use  . Smoking  status: Never Smoker  . Smokeless tobacco: Never Used  Vaping Use  . Vaping Use: Never used  Substance and Sexual Activity  . Alcohol use: No    Alcohol/week: 0.0 standard drinks  . Drug use: No  . Sexual activity: Not on file  Other Topics Concern  .  Not on file  Social History Narrative  . Not on file   Social Determinants of Health   Financial Resource Strain:   . Difficulty of Paying Living Expenses: Not on file  Food Insecurity:   . Worried About Charity fundraiser in the Last Year: Not on file  . Ran Out of Food in the Last Year: Not on file  Transportation Needs:   . Lack of Transportation (Medical): Not on file  . Lack of Transportation (Non-Medical): Not on file  Physical Activity:   . Days of Exercise per Week: Not on file  . Minutes of Exercise per Session: Not on file  Stress:   . Feeling of Stress : Not on file  Social Connections:   . Frequency of Communication with Friends and Family: Not on file  . Frequency of Social Gatherings with Friends and Family: Not on file  . Attends Religious Services: Not on file  . Active Member of Clubs or Organizations: Not on file  . Attends Archivist Meetings: Not on file  . Marital Status: Not on file     Family History: The patient's family history includes Cancer in her brother; Congestive Heart Failure in her father; Diabetes in her mother; Heart failure in her mother; Hypertension in her mother and son. ROS:   Please see the history of present illness.    All other systems reviewed and are negative.  EKGs/Labs/Other Studies Reviewed:    The following studies were reviewed today:  EKG:  EKG 2020-02-29 shows normal dual-chamber pacemaker function pacemaker dependent  Recent Labs: 02/29/2020: BUN 25; Creatinine, Ser 1.52; Hemoglobin 9.9; Platelets 196; Potassium 4.8; Sodium 141; TSH 1.830  Recent Lipid Panel No results found for: CHOL, TRIG, HDL, CHOLHDL, VLDL, LDLCALC, LDLDIRECT  Physical Exam:     VS:  BP (!) 147/68   Pulse 62   Ht 5\' 4"  (1.626 m)   Wt 247 lb 3.2 oz (112.1 kg)   SpO2 94%   BMI 42.43 kg/m     Wt Readings from Last 3 Encounters:  09/21/20 247 lb 3.2 oz (112.1 kg)  05/30/20 (!) 249 lb 6.4 oz (113.1 kg)  02/29/20 249 lb (112.9 kg)     GEN:  Well nourished, well developed in no acute distress HEENT: Normal NECK: No JVD; No carotid bruits LYMPHATICS: No lymphadenopathy CARDIAC: RRR, no murmurs, rubs, gallops RESPIRATORY:  Clear to auscultation without rales, wheezing or rhonchi  ABDOMEN: Soft, non-tender, non-distended MUSCULOSKELETAL:  No edema; No deformity  SKIN: Warm and dry NEUROLOGIC:  Alert and oriented x 3 PSYCHIATRIC:  Normal affect    Signed, Shirlee More, MD  09/21/2020 10:49 AM    [place]

## 2020-09-21 ENCOUNTER — Encounter: Payer: Self-pay | Admitting: Cardiology

## 2020-09-21 ENCOUNTER — Ambulatory Visit: Payer: Medicare PPO | Admitting: Cardiology

## 2020-09-21 ENCOUNTER — Other Ambulatory Visit: Payer: Self-pay

## 2020-09-21 VITALS — BP 147/68 | HR 62 | Ht 64.0 in | Wt 247.2 lb

## 2020-09-21 DIAGNOSIS — Z7901 Long term (current) use of anticoagulants: Secondary | ICD-10-CM

## 2020-09-21 DIAGNOSIS — I25119 Atherosclerotic heart disease of native coronary artery with unspecified angina pectoris: Secondary | ICD-10-CM | POA: Diagnosis not present

## 2020-09-21 DIAGNOSIS — I480 Paroxysmal atrial fibrillation: Secondary | ICD-10-CM

## 2020-09-21 DIAGNOSIS — Z950 Presence of cardiac pacemaker: Secondary | ICD-10-CM

## 2020-09-21 DIAGNOSIS — I110 Hypertensive heart disease with heart failure: Secondary | ICD-10-CM | POA: Diagnosis not present

## 2020-09-21 DIAGNOSIS — E782 Mixed hyperlipidemia: Secondary | ICD-10-CM

## 2020-09-21 DIAGNOSIS — N1832 Chronic kidney disease, stage 3b: Secondary | ICD-10-CM | POA: Diagnosis not present

## 2020-09-21 NOTE — Patient Instructions (Signed)
Medication Instructions:  Your physician recommends that you continue on your current medications as directed. Please refer to the Current Medication list given to you today.  *If you need a refill on your cardiac medications before your next appointment, please call your pharmacy*   Lab Work: Your physician recommends that you return for lab work in: Please have your primary care doctor perform the following labs: CMP, Lipids, ProBNP If you have labs (blood work) drawn today and your tests are completely normal, you will receive your results only by: [name] MyChart Message (if you have MyChart) OR . A paper copy in the mail If you have any lab test that is abnormal or we need to change your treatment, we will call you to review the results.   Testing/Procedures: None   Follow-Up: At [facility], you and your health needs are our priority.  As part of our continuing mission to provide you with exceptional heart care, we have created designated Provider Care Teams.  These Care Teams include your primary Cardiologist (physician) and Advanced Practice Providers (APPs -  Physician Assistants and Nurse Practitioners) who all work together to provide you with the care you need, when you need it.  We recommend signing up for the patient portal called "MyChart".  Sign up information is provided on this After Visit Summary.  MyChart is used to connect with patients for Virtual Visits (Telemedicine).  Patients are able to view lab/test results, encounter notes, upcoming appointments, etc.  Non-urgent messages can be sent to your provider as well.   To learn more about what you can do with MyChart, go to [email or web address].    Your next appointment:   6 month(s)  The format for your next appointment:   In Person  Provider:   Shirlee More, MD   Other Instructions

## 2020-11-08 ENCOUNTER — Ambulatory Visit (INDEPENDENT_AMBULATORY_CARE_PROVIDER_SITE_OTHER): Payer: Medicare PPO

## 2020-11-08 DIAGNOSIS — I495 Sick sinus syndrome: Secondary | ICD-10-CM | POA: Diagnosis not present

## 2020-11-08 LAB — CUP PACEART REMOTE DEVICE CHECK
Battery Remaining Longevity: 116 mo
Battery Voltage: 3.01 V
Brady Statistic AP VP Percent: 94.04 %
Brady Statistic AP VS Percent: 0.03 %
Brady Statistic AS VP Percent: 5.86 %
Brady Statistic AS VS Percent: 0.07 %
Brady Statistic RA Percent Paced: 90.28 %
Brady Statistic RV Percent Paced: 99.58 %
Date Time Interrogation Session: 20220105005837
Implantable Lead Implant Date: 20201006
Implantable Lead Implant Date: 20201006
Implantable Lead Location: 753859
Implantable Lead Location: 753860
Implantable Lead Model: 5076
Implantable Lead Model: 5076
Implantable Pulse Generator Implant Date: 20201006
Lead Channel Impedance Value: 266 Ohm
Lead Channel Impedance Value: 342 Ohm
Lead Channel Impedance Value: 361 Ohm
Lead Channel Impedance Value: 418 Ohm
Lead Channel Pacing Threshold Amplitude: 0.5 V
Lead Channel Pacing Threshold Amplitude: 0.625 V
Lead Channel Pacing Threshold Pulse Width: 0.4 ms
Lead Channel Pacing Threshold Pulse Width: 0.4 ms
Lead Channel Sensing Intrinsic Amplitude: 2.375 mV
Lead Channel Sensing Intrinsic Amplitude: 2.375 mV
Lead Channel Sensing Intrinsic Amplitude: 9.375 mV
Lead Channel Sensing Intrinsic Amplitude: 9.375 mV
Lead Channel Setting Pacing Amplitude: 1.5 V
Lead Channel Setting Pacing Amplitude: 2.5 V
Lead Channel Setting Pacing Pulse Width: 0.4 ms
Lead Channel Setting Sensing Sensitivity: 1.2 mV

## 2020-11-20 ENCOUNTER — Telehealth: Payer: Medicare PPO | Admitting: Cardiology

## 2020-11-22 NOTE — Progress Notes (Signed)
Remote pacemaker transmission.   

## 2020-12-26 ENCOUNTER — Encounter: Payer: Medicare PPO | Admitting: Cardiology

## 2021-01-15 ENCOUNTER — Other Ambulatory Visit: Payer: Self-pay | Admitting: Cardiology

## 2021-01-15 NOTE — Telephone Encounter (Signed)
Carvedilol approved and sent 

## 2021-01-30 ENCOUNTER — Other Ambulatory Visit: Payer: Self-pay

## 2021-01-30 ENCOUNTER — Encounter: Payer: Self-pay | Admitting: Cardiology

## 2021-01-30 ENCOUNTER — Ambulatory Visit: Payer: Medicare PPO | Admitting: Cardiology

## 2021-01-30 VITALS — BP 138/60 | HR 61 | Ht 65.0 in | Wt 247.0 lb

## 2021-01-30 DIAGNOSIS — I495 Sick sinus syndrome: Secondary | ICD-10-CM | POA: Diagnosis not present

## 2021-01-30 NOTE — Patient Instructions (Signed)
Medication Instructions:  Your physician recommends that you continue on your current medications as directed. Please refer to the Current Medication list given to you today.  *If you need a refill on your cardiac medications before your next appointment, please call your pharmacy*   Lab Work: None ordered If you have labs (blood work) drawn today and your tests are completely normal, you will receive your results only by: [name] MyChart Message (if you have MyChart) OR . A paper copy in the mail If you have any lab test that is abnormal or we need to change your treatment, we will call you to review the results.   Testing/Procedures: None ordered   Follow-Up: At [facility], you and your health needs are our priority.  As part of our continuing mission to provide you with exceptional heart care, we have created designated Provider Care Teams.  These Care Teams include your primary Cardiologist (physician) and Advanced Practice Providers (APPs -  Physician Assistants and Nurse Practitioners) who all work together to provide you with the care you need, when you need it.  We recommend signing up for the patient portal called "MyChart".  Sign up information is provided on this After Visit Summary.  MyChart is used to connect with patients for Virtual Visits (Telemedicine).  Patients are able to view lab/test results, encounter notes, upcoming appointments, etc.  Non-urgent messages can be sent to your provider as well.   To learn more about what you can do with MyChart, go to [email or web address].    Remote monitoring is used to monitor your Pacemaker or ICD from home. This monitoring reduces the number of office visits required to check your device to one time per year. It allows [place] to keep an eye on the functioning of your device to ensure it is working properly. You are scheduled for a device check from home on 02/07/2021. You may send your transmission at any time that day. If you have a  wireless device, the transmission will be sent automatically. After your physician reviews your transmission, you will receive a postcard with your next transmission date.  Your next appointment:   1 year(s)  The format for your next appointment:   In Person  Provider:   Allegra Lai, MD   Thank you for choosing [facility]!!   Trinidad Curet, RN [phone]    Other Instructions

## 2021-01-30 NOTE — Progress Notes (Signed)
Electrophysiology Office Note   Date:  01/30/2021   ID:  MADDELYN MORTLAND, DOB January 02, 1946, MRN [id number]  PCP:  Dellia Beckwith, PA-C  Cardiologist:  Bettina Gavia Primary Electrophysiologist:  Juniper Cobey Meredith Leeds, MD    No chief complaint on file.    History of Present Illness: Carmen Ford is a 75 y.o. female who is being seen today for the evaluation of atrial fibrillation at the request of Dellia Beckwith, *. Presenting today for electrophysiology evaluation.    She has a history significant for persistent atrial fibrillation, chronic diastolic heart failure, coronary artery disease, hypertension, hyperlipidemia, CKD stage III.  She was diagnosed with pneumonia and acute renal failure and was hospitalized.  She was found to have episodes of AV block.  She presented to her primary cardiologist office also in atrial fibrillation.  She has had multiple episodes of AV block and bradycardia typically in the setting of acute renal failure and hyperkalemia.  She is now status post Medtronic dual-chamber pacemaker implanted 08/10/2019.  Today, denies symptoms of palpitations, chest pain, shortness of breath, orthopnea, PND, lower extremity edema, claudication, dizziness, presyncope, syncope, bleeding, or neurologic sequela. The patient is tolerating medications without difficulties.  Since last being seen she has done well.  She does continue to complain of fatigue, but overall has no major complaints.  Past Medical History:  Diagnosis Date  . Acquired cavovarus deformity of left foot 12/24/2017  . Acute colitis 05/08/2017   Overview:  CT of the abdomen shows right colitis.  Adrinne Sze administer IV Flagyl and Cipro, clear liquid diet, check stool for c diff and culture.  Sheral Pfahler recheck lactic acid for sepsis.  Was supposed to have colonoscopy recently but has not had one for years.    Last Assessment & Plan:  CT of the abdomen obtained showed right colitis.  Tolerated clear liquid diet.  States has  had no further diarrhea si  . Acute on chronic kidney failure ([place]) 05/08/2017   Last Assessment & Plan:  Patient presented to ER with diarrhea, renal panel indicated dehydration.  Reviewed past renal panels and creatinine is elevated from baseline of about 1.5 and now 2.3. Diarrhea has resolved per patient report and she is tolerating fluids, Chenay Nesmith advance to regular diabetic, cardiac diet. Plan: Resume home lasix lasix  Bun and creatine trending down Diarrhea resolved Tolerat  . Acute renal failure superimposed on stage 4 chronic kidney disease ([place]) 05/08/2017   Last Assessment & Plan:  Recheck BMP and potassium and was trending favorably No change in meds until results known and is ok and aware to hold ARB and diuretic  . Anemia   . Anemia in chronic kidney disease 10/15/2016  . Bradycardia    Overview:  Not on a beta blocker with sinus bradycardia and Mobitz 1 second degree AV block  . Bradycardia by electrocardiography 09/30/2018   Last Assessment & Plan:  Symptomatic and not paced and got well enough to d/c--Jamilynn Whitacre see how it does going forward  Last Assessment & Plan:  Formatting of this note might be different from the original. To get paced--[place]  . CHF (congestive heart failure) ([place]) 02/26/2016   Last Assessment & Plan:  Formatting of this note might be different from the original. Sees cardio and Urie Loughner do BNP today  . Chronic diastolic heart failure ([place]) 02/26/2016   Overview:  Overview:  EF normal by echo 2013  . Chronic ischemic heart disease   . CKD (chronic kidney disease) 05/15/2017  .  Colitis, acute   . Coronary artery disease    CABG  2011: LTA to LAD, SVG to M, SVG to PDA EF 40%  . Coronary artery disease involving native coronary artery of native heart with angina pectoris ([place]) 02/26/2016  . Degenerative arthritis of left knee 06/09/2017  . Diabetes mellitus without complication ([place])    type II   . Diabetic peripheral neuropathy ([place]) 12/14/2018   Last Assessment & Plan:   Sees endo They adjust her insulin  Last Assessment & Plan:  Formatting of this note might be different from the original. Sees endo  . Encounter for routine adult physical exam with abnormal findings 01/13/2017   Last Assessment & Plan:  Formatting of this note might be different from the original. Declines MMG Needs labs I have nothing and she does remember the A1C was good  No change meds and reconciled and cleaned up the problem list  6 months f/u  . Essential (primary) hypertension 05/15/2017  . Essential hypertension 05/15/2017   Last Assessment & Plan:  Do not restart ARB. Avoid beta-blockers, diltiazem, verapamil.  Start amlodipine today  Last Assessment & Plan:  Formatting of this note might be different from the original. Doing well and no changes in treatment  . Fatigue 10/15/2016  . Hammer toe 12/24/2017  . History of total knee arthroplasty 06/30/2018  . Hx of CABG 02/26/2016   Overview:  2011, LTA to LAD, SVG to M, SVG to PDA EF 40%  Formatting of this note might be different from the original. 2011, LTA to LAD, SVG to M, SVG to PDA EF 40%  . Hyperkalemia 09/30/2018   Last Assessment & Plan:  Mild.  Improved.  Continue to follow.  May have been contributing to some of her rhythm disturbances, but I agree likely not the primary cause.  [name] Hyperlipidemia 05/15/2017  . Hypertensive heart disease with heart failure ([place])   . Hypomagnesemia 05/09/2017   Last Assessment & Plan:  Magnesium level 1.7 likely secondary to diarrhea loss Plan: Replaced   . Metatarsalgia 12/24/2017  . Morbid obesity ([place]) 04/20/2019   Formatting of this note might be different from the original. Advised to work on portion control Try to remain active  . Myocardial infarction ([place])   . Osteoarthritis of left knee 06/09/2017  . Persistent atrial fibrillation ([place]) 12/11/2018  . 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 Restless legs syndrome 05/15/2017  . Rupture of left patellar tendon 07/03/2017  . SSS (sick sinus syndrome) ([place])  08/10/2019  . Tendinopathy 12/24/2017  . Visual disturbance of one eye 09/30/2018   Last Assessment & Plan:  Formatting of this note might be different from the original. Had retinal surgery and cataract surgery and so so results  . Vitamin D deficiency 05/15/2017   Past Surgical History:  Procedure Laterality Date  . ACHILLES TENDON SURGERY Left 2005   inserted a new tendon  . CARDIAC SURGERY    . CATARACT EXTRACTION, BILATERAL    . CHOLECYSTECTOMY  02/2016  . COLONOSCOPY    . CORONARY ARTERY BYPASS GRAFT  2011   [facility]  . KNEE ARTHROPLASTY Left 06/09/2017   Procedure: LEFT TOTAL KNEE ARTHROPLASTY WITH COMPUTER NAVIGATION;  Surgeon: Rod Can, MD;  Location: [place];  Service: Orthopedics;  Laterality: Left;  NEED RNFA  . PACEMAKER IMPLANT N/A 08/10/2019   Procedure: PACEMAKER IMPLANT;  Surgeon: Constance Haw, MD;  Location: [facility];  Service: Cardiovascular;  Laterality: N/A;  . PATELLAR TENDON REPAIR  Left 07/03/2017   Procedure: PATELLA TENDON REPAIR LEFT KNEE;  Surgeon: Rod Can, MD;  Location: [facility];  Service: Orthopedics;  Laterality: Left;  [name] VARICOSE VEIN SURGERY Bilateral 1990's     Current Outpatient Medications  Medication Sig Dispense Refill  . acetaminophen (TYLENOL) 325 MG tablet Take 1-2 tablets (325-650 mg total) by mouth every 4 (four) hours as needed for mild pain.    [name] amLODipine (NORVASC) 10 MG tablet Take 10 mg by mouth every other day.    . carvedilol (COREG) 25 MG tablet Take 1/2 (one-half) tablet by mouth twice daily 90 tablet 1  . Cholecalciferol 50 MCG (2000 UT) CAPS Take 2,000 mg by mouth daily.    [name] ELIQUIS 5 MG TABS tablet Take 1 tablet by mouth twice daily 180 tablet 3  . furosemide (LASIX) 20 MG tablet Take 20 mg by mouth daily as needed for edema.     . gabapentin (NEURONTIN) 400 MG capsule Take 400 mg by mouth 3 (three) times daily.     . insulin glargine (LANTUS) 100 UNIT/ML injection Inject 9 Units into the skin  2 (two) times daily.     . insulin lispro (HUMALOG) 100 UNIT/ML injection Inject 2-19 Units into the skin 3 (three) times daily with meals.     [name] omega-3 acid ethyl esters (LOVAZA) 1 G capsule Take 2 g by mouth 2 (two) times daily.     [name] rOPINIRole (REQUIP) 2 MG tablet Take 2 mg by mouth at bedtime.     . valACYclovir (VALTREX) 1000 MG tablet      No current facility-administered medications for this visit.    Allergies:   Penicillins and Statins   Social History:  The patient  reports that she has never smoked. She has never used smokeless tobacco. She reports that she does not drink alcohol and does not use drugs.   Family History:  The patient's family history includes Cancer in her brother; Congestive Heart Failure in her father; Diabetes in her mother; Heart failure in her mother; Hypertension in her mother and son.   ROS:  Please see the history of present illness.   Otherwise, review of systems is positive for none.   All other systems are reviewed and negative.   PHYSICAL EXAM: VS:  BP 138/60   Pulse 61   Ht '5\' 5"'$  (1.651 m)   Wt 247 lb (112 kg)   BMI 41.10 kg/m  , BMI Body mass index is 41.1 kg/m. GEN: Well nourished, well developed, in no acute distress  HEENT: normal  Neck: no JVD, carotid bruits, or masses Cardiac: RRR; no murmurs, rubs, or gallops,no edema  Respiratory:  clear to auscultation bilaterally, normal work of breathing GI: soft, nontender, nondistended, + BS MS: no deformity or atrophy  Skin: warm and dry Neuro:  Strength and sensation are intact Psych: euthymic mood, full affect  EKG:  EKG is ordered today. Personal review of the ekg ordered shows atrial paced, ventricular paced  Recent Labs: 02/29/2020: BUN 25; Creatinine, Ser 1.52; Hemoglobin 9.9; Platelets 196; Potassium 4.8; Sodium 141; TSH 1.830    Lipid Panel  No results found for: CHOL, TRIG, HDL, CHOLHDL, VLDL, LDLCALC, LDLDIRECT   Wt Readings from Last 3 Encounters:  01/30/21 247 lb (112  kg)  09/21/20 247 lb 3.2 oz (112.1 kg)  05/30/20 (!) 249 lb 6.4 oz (113.1 kg)      Other studies Reviewed: Additional studies/ records that were reviewed today include: Epic notes  ASSESSMENT AND PLAN:  1.  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  Device functioning appropriately.  MVP made more aggressive today.  2.  Persistent atrial fibrillation: Currently on Eliquis.  CHA2DS2-VASc of 4.  She continues to have short episodes.  Have turned on atrial therapies.   Current medicines are reviewed at length with the patient today.   The patient does not have concerns regarding her medicines.  The following changes were made today: None  Labs/ tests ordered today include:  Orders Placed This Encounter  Procedures  . EKG 12-Lead     Disposition:   FU with Secret Kristensen 12 months  Signed, Seleste Tallman Meredith Leeds, MD  01/30/2021 2:36 PM     [address] [place] [address] [phone] (office) [phone] (fax)

## 2021-02-07 ENCOUNTER — Ambulatory Visit (INDEPENDENT_AMBULATORY_CARE_PROVIDER_SITE_OTHER): Payer: Medicare PPO

## 2021-02-07 DIAGNOSIS — I495 Sick sinus syndrome: Secondary | ICD-10-CM | POA: Diagnosis not present

## 2021-02-09 LAB — CUP PACEART REMOTE DEVICE CHECK
Battery Remaining Longevity: 113 mo
Battery Voltage: 3.01 V
Brady Statistic AP VP Percent: 96.3 %
Brady Statistic AP VS Percent: 0.1 %
Brady Statistic AS VP Percent: 3.48 %
Brady Statistic AS VS Percent: 0.24 %
Brady Statistic RA Percent Paced: 91.34 %
Brady Statistic RV Percent Paced: 99.48 %
Date Time Interrogation Session: 20220405211349
Implantable Lead Implant Date: 20201006
Implantable Lead Implant Date: 20201006
Implantable Lead Location: 753859
Implantable Lead Location: 753860
Implantable Lead Model: 5076
Implantable Lead Model: 5076
Implantable Pulse Generator Implant Date: 20201006
Lead Channel Impedance Value: 285 Ohm
Lead Channel Impedance Value: 361 Ohm
Lead Channel Impedance Value: 380 Ohm
Lead Channel Impedance Value: 418 Ohm
Lead Channel Pacing Threshold Amplitude: 0.5 V
Lead Channel Pacing Threshold Amplitude: 0.625 V
Lead Channel Pacing Threshold Pulse Width: 0.4 ms
Lead Channel Pacing Threshold Pulse Width: 0.4 ms
Lead Channel Sensing Intrinsic Amplitude: 2.5 mV
Lead Channel Sensing Intrinsic Amplitude: 2.5 mV
Lead Channel Sensing Intrinsic Amplitude: 9.875 mV
Lead Channel Sensing Intrinsic Amplitude: 9.875 mV
Lead Channel Setting Pacing Amplitude: 1.5 V
Lead Channel Setting Pacing Amplitude: 2.5 V
Lead Channel Setting Pacing Pulse Width: 0.4 ms
Lead Channel Setting Sensing Sensitivity: 1.2 mV

## 2021-02-20 NOTE — Progress Notes (Signed)
Remote pacemaker transmission.   

## 2021-04-03 NOTE — Progress Notes (Signed)
Cardiology Office Note:    Date:  04/04/2021   ID:  Carmen Ford, DOB [birth year], MRN [id number]  PCP:  Carmen Beckwith, PA-C  Cardiologist:  Carmen More, MD    Referring MD: Carmen Ford, *    ASSESSMENT:    1. Coronary artery disease involving native coronary artery of native heart with angina pectoris ([place])   2. SSS (sick sinus syndrome) (HCC)   3. Cardiac pacemaker in situ   4. Hypertensive heart disease with heart failure (HCC)   5. Stage 3b chronic kidney disease (HCC)   6. Paroxysmal atrial fibrillation ([place])   7. Chronic anticoagulation   8. Mixed hyperlipidemia    PLAN:    In order of problems listed above:  1. Stable having no angina after surgical revascularization continue medical therapy in her case includes her anticoagulant beta-blocker and takes only fish oil and declines other lipid-lowering treatment and does not want her lipid profile checked today. 2. Stable asymptomatic pacemaker followed by device clinic 3. At target repeat blood pressure by me 140/60 continue current therapy including her loop diuretic beta-blocker amlodipine.  Heart failure is compensated no edema 4. Recheck renal function today 5. Stable on device telemetry continue her beta-blocker and anticoagulant not on an antiarrhythmic drug at this time 6. Moderate stroke risk continue anticoagulant 7. Continue prescription strength fish oil she declines a lipid evaluation and does not want alternate therapy including PCSK9 inhibitor   Next appointment: 6 months   Medication Adjustments/Labs and Tests Ordered: Current medicines are reviewed at length with the patient today.  Concerns regarding medicines are outlined above.  No orders of the defined types were placed in this encounter.  No orders of the defined types were placed in this encounter.   Chief Complaint  Patient presents with  . Follow-up  . Coronary Artery Disease  . Congestive Heart Failure    History of  Present Illness:    Carmen Ford is a 75 y.o. female with a hx of CAD with CABG chronic diastolic heart failure with hypertension and stage IV CKD hyperlipidemia recurrent hyperkalemia and second-degree AV block with a permanent dual-chamber pacemaker last seen 09/21/2020.  Compliance with diet, lifestyle and medications: Yes  Overall she is stable She complains of fatigue and leg fatigue but not claudication Short of breath when she walks outdoors no orthopnea is not having edema. Her weight is stable at home She is not checking her blood pressure frequently. She is taking both amlodipine and gabapentin that can cause sodium retention and reduce the frequency of her gabapentin from 3 to twice daily. I discussed rechecking her lipids and other lipid-lowering therapy and she declines.  Pacemaker remote check 02/06/2021 showed 6 episodes of atrial tachycardia atrial fibrillation longest duration 5 hours.  She is anticoagulated with Eliquis. Recent laboratory studies through [facility] 10/12/2020: Creatinine a little bit elevated from baseline 1.50 previous 1.40 potassium 4.8 GFR 34 cc 04/20/2020 cholesterol 157 LDL 100 triglycerides 108 HDL 35  Past Medical History:  Diagnosis Date  . Acquired cavovarus deformity of left foot 12/24/2017  . Acute colitis 05/08/2017   Overview:  CT of the abdomen shows right colitis.  Will administer IV Flagyl and Cipro, clear liquid diet, check stool for c diff and culture.  Will recheck lactic acid for sepsis.  Was supposed to have colonoscopy recently but has not had one for years.    Last Assessment & Plan:  CT of the abdomen obtained  showed right colitis.  Tolerated clear liquid diet.  States has had no further diarrhea si  . Acute on chronic kidney failure ([place]) 05/08/2017   Last Assessment & Plan:  Patient presented to ER with diarrhea, renal panel indicated dehydration.  Reviewed past renal panels and creatinine is elevated from baseline  of about 1.5 and now 2.3. Diarrhea has resolved per patient report and she is tolerating fluids, will advance to regular diabetic, cardiac diet. Plan: Resume home lasix lasix  Bun and creatine trending down Diarrhea resolved Tolerat  . Acute renal failure superimposed on stage 4 chronic kidney disease ([place]) 05/08/2017   Last Assessment & Plan:  Recheck BMP and potassium and was trending favorably No change in meds until results known and is ok and aware to hold ARB and diuretic  . Anemia   . Anemia in chronic kidney disease 10/15/2016  . Bradycardia    Overview:  Not on a beta blocker with sinus bradycardia and Mobitz 1 second degree AV block  . Bradycardia by electrocardiography 09/30/2018   Last Assessment & Plan:  Symptomatic and not paced and got well enough to d/c--will see how it does going forward  Last Assessment & Plan:  Formatting of this note might be different from the original. To get paced--[place]  . CHF (congestive heart failure) ([place]) 02/26/2016   Last Assessment & Plan:  Formatting of this note might be different from the original. Sees cardio and will do BNP today  . Chronic diastolic heart failure ([place]) 02/26/2016   Overview:  Overview:  EF normal by echo 2013  . Chronic ischemic heart disease   . CKD (chronic kidney disease) 05/15/2017  . Colitis, acute   . Coronary artery disease    CABG  2011: LTA to LAD, SVG to M, SVG to PDA EF 40%  . Coronary artery disease involving native coronary artery of native heart with angina pectoris ([place]) 02/26/2016  . Degenerative arthritis of left knee 06/09/2017  . Diabetes mellitus without complication ([place])    type II   . Diabetic peripheral neuropathy ([place]) 12/14/2018   Last Assessment & Plan:  Sees endo They adjust her insulin  Last Assessment & Plan:  Formatting of this note might be different from the original. Sees endo  . Encounter for routine adult physical exam with abnormal findings 01/13/2017   Last Assessment & Plan:  Formatting of  this note might be different from the original. Declines MMG Needs labs I have nothing and she does remember the A1C was good  No change meds and reconciled and cleaned up the problem list  6 months f/u  . Essential (primary) hypertension 05/15/2017  . Essential hypertension 05/15/2017   Last Assessment & Plan:  Do not restart ARB. Avoid beta-blockers, diltiazem, verapamil.  Start amlodipine today  Last Assessment & Plan:  Formatting of this note might be different from the original. Doing well and no changes in treatment  . Fatigue 10/15/2016  . Hammer toe 12/24/2017  . History of total knee arthroplasty 06/30/2018  . Hx of CABG 02/26/2016   Overview:  2011, LTA to LAD, SVG to M, SVG to PDA EF 40%  Formatting of this note might be different from the original. 2011, LTA to LAD, SVG to M, SVG to PDA EF 40%  . Hyperkalemia 09/30/2018   Last Assessment & Plan:  Mild.  Improved.  Continue to follow.  May have been contributing to some of her rhythm disturbances, but I agree likely not  the primary cause.  [name] Hyperlipidemia 05/15/2017  . Hypertensive heart disease with heart failure ([place])   . Hypomagnesemia 05/09/2017   Last Assessment & Plan:  Magnesium level 1.7 likely secondary to diarrhea loss Plan: Replaced   . Metatarsalgia 12/24/2017  . Morbid obesity ([place]) 04/20/2019   Formatting of this note might be different from the original. Advised to work on portion control Try to remain active  . Myocardial infarction ([place])   . Osteoarthritis of left knee 06/09/2017  . Persistent atrial fibrillation ([place]) 12/11/2018  . 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 Restless legs syndrome 05/15/2017  . Rupture of left patellar tendon 07/03/2017  . SSS (sick sinus syndrome) ([place]) 08/10/2019  . Tendinopathy 12/24/2017  . Visual disturbance of one eye 09/30/2018   Last Assessment & Plan:  Formatting of this note might be different from the original. Had retinal surgery and cataract surgery and so so results  . Vitamin D deficiency  05/15/2017    Past Surgical History:  Procedure Laterality Date  . ACHILLES TENDON SURGERY Left 2005   inserted a new tendon  . CARDIAC SURGERY    . CATARACT EXTRACTION, BILATERAL    . CHOLECYSTECTOMY  02/2016  . COLONOSCOPY    . CORONARY ARTERY BYPASS GRAFT  2011   [facility]  . KNEE ARTHROPLASTY Left 06/09/2017   Procedure: LEFT TOTAL KNEE ARTHROPLASTY WITH COMPUTER NAVIGATION;  Surgeon: Rod Can, MD;  Location: [place];  Service: Orthopedics;  Laterality: Left;  NEED RNFA  . PACEMAKER IMPLANT N/A 08/10/2019   Procedure: PACEMAKER IMPLANT;  Surgeon: Constance Haw, MD;  Location: [facility];  Service: Cardiovascular;  Laterality: N/A;  . PATELLAR TENDON REPAIR Left 07/03/2017   Procedure: PATELLA TENDON REPAIR LEFT KNEE;  Surgeon: Rod Can, MD;  [facility];  Service: Orthopedics;  Laterality: Left;  [name] VARICOSE VEIN SURGERY Bilateral 1990's    Current Medications: Current Meds  Medication Sig  . acetaminophen (TYLENOL) 325 MG tablet Take 1-2 tablets (325-650 mg total) by mouth every 4 (four) hours as needed for mild pain.  [name] amLODipine (NORVASC) 10 MG tablet Take 10 mg by mouth every other day.  . carvedilol (COREG) 25 MG tablet Take 1/2 (one-half) tablet by mouth twice daily  . Cholecalciferol 50 MCG (2000 UT) CAPS Take 2,000 mg by mouth daily.  [name] ELIQUIS 5 MG TABS tablet Take 1 tablet by mouth twice daily  . furosemide (LASIX) 20 MG tablet Take 20 mg by mouth daily as needed for edema.   . gabapentin (NEURONTIN) 400 MG capsule Take 400 mg by mouth 3 (three) times daily.   . insulin glargine (LANTUS) 100 UNIT/ML injection Inject 9 Units into the skin 2 (two) times daily.   . insulin lispro (HUMALOG) 100 UNIT/ML injection Inject 2-19 Units into the skin 3 (three) times daily with meals.   [name] omega-3 acid ethyl esters (LOVAZA) 1 G capsule Take 2 g by mouth 2 (two) times daily.   [name] rOPINIRole (REQUIP) 2 MG tablet Take 2 mg by mouth at bedtime.   .  valACYclovir (VALTREX) 1000 MG tablet      Allergies:   Penicillins and Statins   Social History   Socioeconomic History  . Marital status: Married    Spouse name: Not on file  . Number of children: Not on file  . Years of education: Not on file  . Highest education level: Not on file  Occupational History  . Not on file  Tobacco Use  .  Smoking status: Never Smoker  . Smokeless tobacco: Never Used  Vaping Use  . Vaping Use: Never used  Substance and Sexual Activity  . Alcohol use: No    Alcohol/week: 0.0 standard drinks  . Drug use: No  . Sexual activity: Not on file  Other Topics Concern  . Not on file  Social History Narrative  . Not on file   Social Determinants of Health   Financial Resource Strain: Not on file  Food Insecurity: Not on file  Transportation Needs: Not on file  Physical Activity: Not on file  Stress: Not on file  Social Connections: Not on file     Family History: The patient's family history includes Cancer in her brother; Congestive Heart Failure in her father; Diabetes in her mother; Heart failure in her mother; Hypertension in her mother and son. ROS:   Please see the history of present illness.    All other systems reviewed and are negative.  EKGs/Labs/Other Studies Reviewed:    The following studies were reviewed today:    Physical Exam:    VS:  BP (!) 174/77 (BP Location: Left Arm, Patient Position: Sitting)   Pulse 78   Ht '5\' 5"'$  (1.651 m)   Wt 246 lb 9.6 oz (111.9 kg)   SpO2 96%   BMI 41.04 kg/m     Wt Readings from Last 3 Encounters:  04/04/21 246 lb 9.6 oz (111.9 kg)  01/30/21 247 lb (112 kg)  09/21/20 247 lb 3.2 oz (112.1 kg)     GEN:  Well nourished, well developed in no acute distress HEENT: Normal NECK: No JVD; No carotid bruits LYMPHATICS: No lymphadenopathy CARDIAC: RRR, no murmurs, rubs, gallops RESPIRATORY:  Clear to auscultation without rales, wheezing or rhonchi  ABDOMEN: Soft, non-tender,  non-distended MUSCULOSKELETAL:  No edema; No deformity  SKIN: Warm and dry NEUROLOGIC:  Alert and oriented x 3 PSYCHIATRIC:  Normal affect    Signed, Carmen More, MD  04/04/2021 9:34 AM    [place]

## 2021-04-04 ENCOUNTER — Telehealth: Payer: Self-pay

## 2021-04-04 ENCOUNTER — Encounter: Payer: Self-pay | Admitting: Cardiology

## 2021-04-04 ENCOUNTER — Ambulatory Visit: Payer: Medicare PPO | Admitting: Cardiology

## 2021-04-04 ENCOUNTER — Other Ambulatory Visit: Payer: Self-pay

## 2021-04-04 VITALS — BP 174/77 | HR 78 | Ht 65.0 in | Wt 246.6 lb

## 2021-04-04 DIAGNOSIS — I495 Sick sinus syndrome: Secondary | ICD-10-CM | POA: Diagnosis not present

## 2021-04-04 DIAGNOSIS — I480 Paroxysmal atrial fibrillation: Secondary | ICD-10-CM

## 2021-04-04 DIAGNOSIS — I110 Hypertensive heart disease with heart failure: Secondary | ICD-10-CM | POA: Diagnosis not present

## 2021-04-04 DIAGNOSIS — I25119 Atherosclerotic heart disease of native coronary artery with unspecified angina pectoris: Secondary | ICD-10-CM

## 2021-04-04 DIAGNOSIS — Z950 Presence of cardiac pacemaker: Secondary | ICD-10-CM

## 2021-04-04 DIAGNOSIS — N1832 Chronic kidney disease, stage 3b: Secondary | ICD-10-CM

## 2021-04-04 DIAGNOSIS — Z7901 Long term (current) use of anticoagulants: Secondary | ICD-10-CM

## 2021-04-04 DIAGNOSIS — E782 Mixed hyperlipidemia: Secondary | ICD-10-CM

## 2021-04-04 LAB — CBC
Hematocrit: 32.3 % — ABNORMAL LOW (ref 34.0–46.6)
Hemoglobin: 10.4 g/dL — ABNORMAL LOW (ref 11.1–15.9)
MCH: 27.5 pg (ref 26.6–33.0)
MCHC: 32.2 g/dL (ref 31.5–35.7)
MCV: 85 fL (ref 79–97)
Platelets: 199 10*3/uL (ref 150–450)
RBC: 3.78 x10E6/uL (ref 3.77–5.28)
RDW: 14.2 % (ref 11.7–15.4)
WBC: 5.7 10*3/uL (ref 3.4–10.8)

## 2021-04-04 LAB — BASIC METABOLIC PANEL
BUN/Creatinine Ratio: 18 (ref 12–28)
BUN: 29 mg/dL — ABNORMAL HIGH (ref 8–27)
CO2: 23 mmol/L (ref 20–29)
Calcium: 8.3 mg/dL — ABNORMAL LOW (ref 8.7–10.3)
Chloride: 101 mmol/L (ref 96–106)
Creatinine, Ser: 1.6 mg/dL — ABNORMAL HIGH (ref 0.57–1.00)
Glucose: 115 mg/dL — ABNORMAL HIGH (ref 65–99)
Potassium: 5 mmol/L (ref 3.5–5.2)
Sodium: 140 mmol/L (ref 134–144)
eGFR: 34 mL/min/{1.73_m2} — ABNORMAL LOW (ref >59)

## 2021-04-04 NOTE — Telephone Encounter (Signed)
Spoke with patient regarding results and recommendation.  Patient verbalizes understanding and is agreeable to plan of care. Advised patient to call back with any issues or concerns.  

## 2021-04-04 NOTE — Telephone Encounter (Signed)
-----   Message from Richardo Priest, MD sent at 04/04/2021  5:00 PM EDT ----- Good/stable results  Remains mildly anemic  No change in treatment

## 2021-04-04 NOTE — Patient Instructions (Signed)
Medication Instructions:  Your physician has recommended you make the following change in your medication:  DECREASE: Gabapentin take by mouth twice daily.  *If you need a refill on your cardiac medications before your next appointment, please call your pharmacy*   Lab Work: Your physician recommends that you return for lab work in: TODAY CBC, BMP If you have labs (blood work) drawn today and your tests are completely normal, you will receive your results only by: [name] MyChart Message (if you have MyChart) OR . A paper copy in the mail If you have any lab test that is abnormal or we need to change your treatment, we will call you to review the results.   Testing/Procedures: None   Follow-Up: At [facility], you and your health needs are our priority.  As part of our continuing mission to provide you with exceptional heart care, we have created designated Provider Care Teams.  These Care Teams include your primary Cardiologist (physician) and Advanced Practice Providers (APPs -  Physician Assistants and Nurse Practitioners) who all work together to provide you with the care you need, when you need it.  We recommend signing up for the patient portal called "MyChart".  Sign up information is provided on this After Visit Summary.  MyChart is used to connect with patients for Virtual Visits (Telemedicine).  Patients are able to view lab/test results, encounter notes, upcoming appointments, etc.  Non-urgent messages can be sent to your provider as well.   To learn more about what you can do with MyChart, go to [email or web address].    Your next appointment:   6 month(s)  The format for your next appointment:   In Person  Provider:   Shirlee More, MD   Other Instructions

## 2021-05-02 ENCOUNTER — Other Ambulatory Visit: Payer: Self-pay

## 2021-05-02 MED ORDER — CARVEDILOL 25 MG PO TABS: 12.5000 mg | ORAL_TABLET | Freq: Two times a day (BID) | ORAL | 3 refills | Status: DC

## 2021-05-02 NOTE — Telephone Encounter (Signed)
Refill of Carvedilol 25 mg sent to [facility].

## 2021-05-09 ENCOUNTER — Ambulatory Visit (INDEPENDENT_AMBULATORY_CARE_PROVIDER_SITE_OTHER): Payer: Medicare PPO

## 2021-05-09 DIAGNOSIS — I495 Sick sinus syndrome: Secondary | ICD-10-CM | POA: Diagnosis not present

## 2021-05-09 LAB — CUP PACEART REMOTE DEVICE CHECK
Battery Remaining Longevity: 109 mo
Battery Voltage: 3 V
Brady Statistic AP VP Percent: 95.43 %
Brady Statistic AP VS Percent: 0.08 %
Brady Statistic AS VP Percent: 4.44 %
Brady Statistic AS VS Percent: 0.1 %
Brady Statistic RA Percent Paced: 91.74 %
Brady Statistic RV Percent Paced: 99.64 %
Date Time Interrogation Session: 20220706052744
Implantable Lead Implant Date: 20201006
Implantable Lead Implant Date: 20201006
Implantable Lead Location: 753859
Implantable Lead Location: 753860
Implantable Lead Model: 5076
Implantable Lead Model: 5076
Implantable Pulse Generator Implant Date: 20201006
Lead Channel Impedance Value: 285 Ohm
Lead Channel Impedance Value: 361 Ohm
Lead Channel Impedance Value: 399 Ohm
Lead Channel Impedance Value: 418 Ohm
Lead Channel Pacing Threshold Amplitude: 0.5 V
Lead Channel Pacing Threshold Amplitude: 0.625 V
Lead Channel Pacing Threshold Pulse Width: 0.4 ms
Lead Channel Pacing Threshold Pulse Width: 0.4 ms
Lead Channel Sensing Intrinsic Amplitude: 13.125 mV
Lead Channel Sensing Intrinsic Amplitude: 13.125 mV
Lead Channel Sensing Intrinsic Amplitude: 2.375 mV
Lead Channel Sensing Intrinsic Amplitude: 2.375 mV
Lead Channel Setting Pacing Amplitude: 1.5 V
Lead Channel Setting Pacing Amplitude: 2.5 V
Lead Channel Setting Pacing Pulse Width: 0.4 ms
Lead Channel Setting Sensing Sensitivity: 1.2 mV

## 2021-05-31 NOTE — Progress Notes (Signed)
Remote pacemaker transmission.   

## 2021-08-08 ENCOUNTER — Ambulatory Visit (INDEPENDENT_AMBULATORY_CARE_PROVIDER_SITE_OTHER): Payer: Medicare PPO

## 2021-08-08 DIAGNOSIS — I495 Sick sinus syndrome: Secondary | ICD-10-CM

## 2021-08-08 LAB — CUP PACEART REMOTE DEVICE CHECK
Battery Remaining Longevity: 105 mo
Battery Voltage: 3 V
Brady Statistic AP VP Percent: 98.14 %
Brady Statistic AP VS Percent: 0.05 %
Brady Statistic AS VP Percent: 1.86 %
Brady Statistic AS VS Percent: 0.03 %
Brady Statistic RA Percent Paced: 93.92 %
Brady Statistic RV Percent Paced: 99.8 %
Date Time Interrogation Session: 20221004231213
Implantable Lead Implant Date: 20201006
Implantable Lead Implant Date: 20201006
Implantable Lead Location: 753859
Implantable Lead Location: 753860
Implantable Lead Model: 5076
Implantable Lead Model: 5076
Implantable Pulse Generator Implant Date: 20201006
Lead Channel Impedance Value: 285 Ohm
Lead Channel Impedance Value: 361 Ohm
Lead Channel Impedance Value: 380 Ohm
Lead Channel Impedance Value: 418 Ohm
Lead Channel Pacing Threshold Amplitude: 0.625 V
Lead Channel Pacing Threshold Amplitude: 0.625 V
Lead Channel Pacing Threshold Pulse Width: 0.4 ms
Lead Channel Pacing Threshold Pulse Width: 0.4 ms
Lead Channel Sensing Intrinsic Amplitude: 2.5 mV
Lead Channel Sensing Intrinsic Amplitude: 2.5 mV
Lead Channel Sensing Intrinsic Amplitude: 9.875 mV
Lead Channel Sensing Intrinsic Amplitude: 9.875 mV
Lead Channel Setting Pacing Amplitude: 1.5 V
Lead Channel Setting Pacing Amplitude: 2.5 V
Lead Channel Setting Pacing Pulse Width: 0.4 ms
Lead Channel Setting Sensing Sensitivity: 1.2 mV

## 2021-08-16 NOTE — Progress Notes (Signed)
Remote pacemaker transmission.   

## 2021-10-09 NOTE — Progress Notes (Signed)
Cardiology Office Note:    Date:  10/10/2021   ID:  Carmen Ford, DOB 1946-04-11, MRN [id number]  PCP:  Dellia Beckwith, PA-C  Cardiologist:  Shirlee More, MD    Referring MD: Dellia Beckwith, *    ASSESSMENT:    1. Hypertensive heart disease with heart failure (HCC)   2. Stage 3b chronic kidney disease (HCC)   3. Paroxysmal atrial fibrillation ([place])   4. SSS (sick sinus syndrome) (HCC)   5. Cardiac pacemaker in situ   6. Chronic anticoagulation   7. Coronary artery disease involving native coronary artery of native heart with angina pectoris ([place])   8. Mixed hyperlipidemia    PLAN:    In order of problems listed above:  Overall she is stable she has a little bit of edema and some exertional shortness of breath Very hesitant to change diuretics not Knowing her renal function and she assures me a copy of the labs being done next week.  If renal function is stable she will need a higher dose of diuretic Her A. fib burden is mild 4.4% she is unaware I would not put her on amiodarone continue her beta-blocker and anticoagulant Stable pacemaker function followed in device clinic Stable CAD having no anginal discomfort Intolerant of statins currently taking fish oil she should have a lipid profile done next week consider alternative treatment PCSK9 inhibitor Previous anemia I asked her daughter to be sure she has a CBC drawn with her CKD   Next appointment: 6 months   Medication Adjustments/Labs and Tests Ordered: Current medicines are reviewed at length with the patient today.  Concerns regarding medicines are outlined above.  No orders of the defined types were placed in this encounter.  No orders of the defined types were placed in this encounter.   Chief Complaint  Patient presents with   Follow-up   Congestive Heart Failure   Coronary Artery Disease    History of Present Illness:    Carmen Ford is a 75 y.o. 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  Compliance with diet, lifestyle and medications: Yes  Patient has exertional shortness of breath is not having edema orthopnea chest pain palpitation or syncope.  She is unaware of atrial fibrillation She has had no bleeding complications with her anticoagulant and has not held a statin with some tolerance She is due for lab work in the next week and tells me her PCP is relocating to [facility] physician I asked to have a copy of her labs sent to  Her latest remote device check 08/07/2021 showed normal device battery and lead parameters there was  an atrial fibrillation burden of 4.4%.  Past Medical History:  Diagnosis Date   Acquired cavovarus deformity of left foot 12/24/2017   Acute colitis 05/08/2017   Overview:  CT of the abdomen shows right colitis.  Will administer IV Flagyl and Cipro, clear liquid diet, check stool for c diff and culture.  Will recheck lactic acid for sepsis.  Was supposed to have colonoscopy recently but has not had one for years.    Last Assessment & Plan:  CT of the abdomen obtained showed right colitis.  Tolerated clear liquid diet.  States has had no further diarrhea si   Acute on chronic kidney failure (HCC)  05/08/2017   Last Assessment & Plan:  Patient presented to ER with diarrhea, renal panel indicated dehydration.  Reviewed past renal panels and creatinine is elevated from baseline of about 1.5 and now 2.3. Diarrhea has resolved per patient report and she is tolerating fluids, will advance to regular diabetic, cardiac diet. Plan: Resume home lasix lasix  Bun and creatine trending down Diarrhea resolved Tolerat   Acute renal failure superimposed on stage 4 chronic kidney disease ([place]) 05/08/2017   Last Assessment & Plan:   Recheck BMP and potassium and was trending favorably No change in meds until results known and is ok and aware to hold ARB and diuretic   Anemia    Anemia in chronic kidney disease 10/15/2016   Bradycardia    Overview:  Not on a beta blocker with sinus bradycardia and Mobitz 1 second degree AV block   Bradycardia by electrocardiography 09/30/2018   Last Assessment & Plan:  Symptomatic and not paced and got well enough to d/c--will see how it does going forward  Last Assessment & Plan:  Formatting of this note might be different from the original. To get paced--[place]   CHF (congestive heart failure) ([place]) 02/26/2016   Last Assessment & Plan:  Formatting of this note might be different from the original. Sees cardio and will do BNP today   Chronic diastolic heart failure ([place]) 02/26/2016   Overview:  Overview:  EF normal by echo 2013   Chronic ischemic heart disease    CKD (chronic kidney disease) 05/15/2017   Colitis, acute    Coronary artery disease    CABG  2011: LTA to LAD, SVG to M, SVG to PDA EF 40%   Coronary artery disease involving native coronary artery of native heart with angina pectoris ([place]) 02/26/2016   Degenerative arthritis of left knee 06/09/2017   Diabetes mellitus without complication ([place])    type II    Diabetic peripheral neuropathy ([place]) 12/14/2018   Last Assessment & Plan:  Sees endo They adjust her insulin  Last Assessment & Plan:  Formatting of this note might be different from the original. Sees endo   Encounter for routine adult physical exam with abnormal findings 01/13/2017   Last Assessment & Plan:  Formatting of this note might be different from the original. Declines MMG Needs labs I have nothing and she does remember the A1C was good  No change meds and reconciled and cleaned up the problem list  6 months f/u   Essential (primary) hypertension 05/15/2017   Essential hypertension 05/15/2017   Last Assessment & Plan:  Do not restart ARB. Avoid beta-blockers,  diltiazem, verapamil.  Start amlodipine today  Last Assessment & Plan:  Formatting of this note might be different from the original. Doing well and no changes in treatment   Fatigue 10/15/2016   Hammer toe 12/24/2017   History of total knee arthroplasty 06/30/2018   Hx of CABG 02/26/2016   Overview:  2011, LTA to LAD, SVG to M, SVG to PDA EF 40%  Formatting of this note might be different from the original. 2011, LTA to LAD, SVG to M, SVG to PDA EF 40%   Hyperkalemia 09/30/2018   Last Assessment & Plan:  Mild.  Improved.  Continue to follow.  May have been contributing to some of her rhythm disturbances, but I agree likely not the primary cause.   Hyperlipidemia 05/15/2017   Hypertensive heart disease with heart failure ([place])    Hypomagnesemia 05/09/2017   Last  Assessment & Plan:  Magnesium level 1.7 likely secondary to diarrhea loss Plan: Replaced    Metatarsalgia 12/24/2017   Morbid obesity ([place]) 04/20/2019   Formatting of this note might be different from the original. 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 Had retinal surgery and cataract surgery and so so results   Vitamin D deficiency 05/15/2017    Past Surgical History:  Procedure Laterality Date   ACHILLES TENDON SURGERY Left 2005   inserted a new tendon   CARDIAC SURGERY     CATARACT EXTRACTION, BILATERAL     CHOLECYSTECTOMY  02/2016   COLONOSCOPY     CORONARY ARTERY BYPASS GRAFT  2011   [facility]   KNEE ARTHROPLASTY Left 06/09/2017   Procedure: LEFT TOTAL KNEE ARTHROPLASTY WITH  COMPUTER NAVIGATION;  Surgeon: Rod Can, MD;  Location: [facility];  Service: Orthopedics;  Laterality: Left;  NEED RNFA   PACEMAKER IMPLANT N/A 08/10/2019   Procedure: PACEMAKER IMPLANT;  Surgeon: Constance Haw, MD;  Location: [facility];  Service: Cardiovascular;  Laterality: N/A;   PATELLAR TENDON REPAIR Left 07/03/2017   Procedure: PATELLA TENDON REPAIR LEFT KNEE;  Surgeon: Rod Can, MD;  Location: [facility];  Service: Orthopedics;  Laterality: Left;   VARICOSE VEIN SURGERY Bilateral 1990's    Current Medications: Current Meds  Medication Sig   acetaminophen (TYLENOL) 325 MG tablet Take 1-2 tablets (325-650 mg total) by mouth every 4 (four) hours as needed for mild pain.   amLODipine (NORVASC) 10 MG tablet Take 10 mg by mouth every other day.   carvedilol (COREG) 25 MG tablet Take 0.5 tablets (12.5 mg total) by mouth 2 (two) times daily with a meal.   Cholecalciferol 50 MCG (2000 UT) CAPS Take 2,000 mg by mouth daily.   ELIQUIS 5 MG TABS tablet Take 1 tablet by mouth twice daily   furosemide (LASIX) 20 MG tablet Take 20 mg by mouth daily as needed for edema.    gabapentin (NEURONTIN) 400 MG capsule Take 400 mg by mouth in the morning and at bedtime.   insulin glargine (LANTUS) 100 UNIT/ML injection Inject 9 Units into the skin 2 (two) times daily.    insulin lispro (HUMALOG) 100 UNIT/ML injection Inject 2-19 Units into the skin 3 (three) times daily with meals.    omega-3 acid ethyl esters (LOVAZA) 1 G capsule Take 2 g by mouth 2 (two) times daily.    rOPINIRole (REQUIP) 2 MG tablet Take 2 mg by mouth at bedtime.      Allergies:   Penicillins and Statins   Social History   Socioeconomic History   Marital status: Married    Spouse name: Not on file   Number of children: Not on file   Years of education: Not on file   Highest education level: Not on file  Occupational History   Not on file  Tobacco Use   Smoking status: Never   Smokeless tobacco: Never   Vaping Use   Vaping Use: Never used  Substance and Sexual Activity  Alcohol use: No    Alcohol/week: 0.0 standard drinks   Drug use: No   Sexual activity: Not on file  Other Topics Concern   Not on file  Social History Narrative   Not on file   Social Determinants of Health   Financial Resource Strain: Not on file  Food Insecurity: Not on file  Transportation Needs: Not on file  Physical Activity: Not on file  Stress: Not on file  Social Connections: Not on file     Family History: The patient's family history includes Cancer in her brother; Congestive Heart Failure in her father; Diabetes in her mother; Heart failure in her mother; Hypertension in her mother and son. ROS:   Please see the history of present illness.    All other systems reviewed and are negative.  EKGs/Labs/Other Studies Reviewed:    The following studies were reviewed today:    Recent Labs: 04/04/2021: BUN 29; Creatinine, Ser 1.60; Hemoglobin 10.4; Platelets 199; Potassium 5.0; Sodium 140  Recent Lipid Panel No results found for: CHOL, TRIG, HDL, CHOLHDL, VLDL, LDLCALC, LDLDIRECT  Physical Exam:    VS:  BP 122/70 (BP Location: Left Arm, Patient Position: Sitting, Cuff Size: Normal)   Pulse 64   Ht 5\' 5"  (1.651 m)   Wt 247 lb (112 kg)   SpO2 95%   BMI 41.10 kg/m     Wt Readings from Last 3 Encounters:  10/10/21 247 lb (112 kg)  04/04/21 246 lb 9.6 oz (111.9 kg)  01/30/21 247 lb (112 kg)     GEN: Appears her age well nourished, well developed in no acute distress HEENT: Normal NECK: No JVD; No carotid bruits LYMPHATICS: No lymphadenopathy CARDIAC: She is RRR, no murmurs, rubs, gallops RESPIRATORY:  Clear to auscultation without rales, wheezing or rhonchi  ABDOMEN: Soft, non-tender, non-distended MUSCULOSKELETAL: 1+ lower extremity bilateral pitting edema; No deformity  SKIN: Warm and dry NEUROLOGIC:  Alert and oriented x 3 PSYCHIATRIC:  Normal affect    Signed, Shirlee More, MD   10/10/2021 7:59 AM    [place]

## 2021-10-10 ENCOUNTER — Ambulatory Visit: Payer: Medicare PPO | Admitting: Cardiology

## 2021-10-10 ENCOUNTER — Other Ambulatory Visit: Payer: Self-pay

## 2021-10-10 ENCOUNTER — Encounter: Payer: Self-pay | Admitting: Cardiology

## 2021-10-10 VITALS — BP 122/70 | HR 64 | Ht 65.0 in | Wt 247.0 lb

## 2021-10-10 DIAGNOSIS — Z7901 Long term (current) use of anticoagulants: Secondary | ICD-10-CM

## 2021-10-10 DIAGNOSIS — I25119 Atherosclerotic heart disease of native coronary artery with unspecified angina pectoris: Secondary | ICD-10-CM

## 2021-10-10 DIAGNOSIS — E782 Mixed hyperlipidemia: Secondary | ICD-10-CM

## 2021-10-10 DIAGNOSIS — I110 Hypertensive heart disease with heart failure: Secondary | ICD-10-CM | POA: Diagnosis not present

## 2021-10-10 DIAGNOSIS — I495 Sick sinus syndrome: Secondary | ICD-10-CM | POA: Diagnosis not present

## 2021-10-10 DIAGNOSIS — N1832 Chronic kidney disease, stage 3b: Secondary | ICD-10-CM

## 2021-10-10 DIAGNOSIS — I480 Paroxysmal atrial fibrillation: Secondary | ICD-10-CM

## 2021-10-10 DIAGNOSIS — Z950 Presence of cardiac pacemaker: Secondary | ICD-10-CM

## 2021-10-10 NOTE — Patient Instructions (Signed)
Medication Instructions:  Your physician recommends that you continue on your current medications as directed. Please refer to the Current Medication list given to you today.  *If you need a refill on your cardiac medications before your next appointment, please call your pharmacy*   Lab Work: None ordered If you have labs (blood work) drawn today and your tests are completely normal, you will receive your results only by: MyChart Message (if you have MyChart) OR A paper copy in the mail If you have any lab test that is abnormal or we need to change your treatment, we will call you to review the results.   Testing/Procedures: None ordered   Follow-Up: At [facility], you and your health needs are our priority.  As part of our continuing mission to provide you with exceptional heart care, we have created designated Provider Care Teams.  These Care Teams include your primary Cardiologist (physician) and Advanced Practice Providers (APPs -  Physician Assistants and Nurse Practitioners) who all work together to provide you with the care you need, when you need it.  We recommend signing up for the patient portal called "MyChart".  Sign up information is provided on this After Visit Summary.  MyChart is used to connect with patients for Virtual Visits (Telemedicine).  Patients are able to view lab/test results, encounter notes, upcoming appointments, etc.  Non-urgent messages can be sent to your provider as well.   To learn more about what you can do with MyChart, go to [email or web address].    Your next appointment:   6 month(s)  The format for your next appointment:   In Person  Provider:   Brian Munley, MD   Other Instructions NA  

## 2021-11-07 ENCOUNTER — Ambulatory Visit (INDEPENDENT_AMBULATORY_CARE_PROVIDER_SITE_OTHER): Payer: Medicare PPO

## 2021-11-07 DIAGNOSIS — I495 Sick sinus syndrome: Secondary | ICD-10-CM

## 2021-11-07 LAB — CUP PACEART REMOTE DEVICE CHECK
Battery Remaining Longevity: 102 mo
Battery Voltage: 3 V
Brady Statistic AP VP Percent: 99.34 %
Brady Statistic AP VS Percent: 0.03 %
Brady Statistic AS VP Percent: 0.62 %
Brady Statistic AS VS Percent: 0.01 %
Brady Statistic RA Percent Paced: 99.37 %
Brady Statistic RV Percent Paced: 99.96 %
Date Time Interrogation Session: 20230103232019
Implantable Lead Implant Date: 20201006
Implantable Lead Implant Date: 20201006
Implantable Lead Location: 753859
Implantable Lead Location: 753860
Implantable Lead Model: 5076
Implantable Lead Model: 5076
Implantable Pulse Generator Implant Date: 20201006
Lead Channel Impedance Value: 285 Ohm
Lead Channel Impedance Value: 361 Ohm
Lead Channel Impedance Value: 380 Ohm
Lead Channel Impedance Value: 418 Ohm
Lead Channel Pacing Threshold Amplitude: 0.5 V
Lead Channel Pacing Threshold Amplitude: 0.625 V
Lead Channel Pacing Threshold Pulse Width: 0.4 ms
Lead Channel Pacing Threshold Pulse Width: 0.4 ms
Lead Channel Sensing Intrinsic Amplitude: 11.5 mV
Lead Channel Sensing Intrinsic Amplitude: 11.5 mV
Lead Channel Sensing Intrinsic Amplitude: 2.625 mV
Lead Channel Sensing Intrinsic Amplitude: 2.625 mV
Lead Channel Setting Pacing Amplitude: 1.5 V
Lead Channel Setting Pacing Amplitude: 2.5 V
Lead Channel Setting Pacing Pulse Width: 0.4 ms
Lead Channel Setting Sensing Sensitivity: 1.2 mV

## 2021-11-19 NOTE — Progress Notes (Signed)
Remote pacemaker transmission.   

## 2022-02-06 ENCOUNTER — Ambulatory Visit (INDEPENDENT_AMBULATORY_CARE_PROVIDER_SITE_OTHER): Payer: Medicare PPO

## 2022-02-06 DIAGNOSIS — I495 Sick sinus syndrome: Secondary | ICD-10-CM

## 2022-02-06 LAB — CUP PACEART REMOTE DEVICE CHECK
Battery Remaining Longevity: 100 mo
Battery Voltage: 2.99 V
Brady Statistic AP VP Percent: 99.07 %
Brady Statistic AP VS Percent: 0.07 %
Brady Statistic AS VP Percent: 0.91 %
Brady Statistic AS VS Percent: 0.03 %
Brady Statistic RA Percent Paced: 90.53 %
Brady Statistic RV Percent Paced: 99.77 %
Date Time Interrogation Session: 20230404223744
Implantable Lead Implant Date: 20201006
Implantable Lead Implant Date: 20201006
Implantable Lead Location: 753859
Implantable Lead Location: 753860
Implantable Lead Model: 5076
Implantable Lead Model: 5076
Implantable Pulse Generator Implant Date: 20201006
Lead Channel Impedance Value: 304 Ohm
Lead Channel Impedance Value: 380 Ohm
Lead Channel Impedance Value: 399 Ohm
Lead Channel Impedance Value: 437 Ohm
Lead Channel Pacing Threshold Amplitude: 0.5 V
Lead Channel Pacing Threshold Amplitude: 0.625 V
Lead Channel Pacing Threshold Pulse Width: 0.4 ms
Lead Channel Pacing Threshold Pulse Width: 0.4 ms
Lead Channel Sensing Intrinsic Amplitude: 12.375 mV
Lead Channel Sensing Intrinsic Amplitude: 12.375 mV
Lead Channel Sensing Intrinsic Amplitude: 2.5 mV
Lead Channel Sensing Intrinsic Amplitude: 2.5 mV
Lead Channel Setting Pacing Amplitude: 1.5 V
Lead Channel Setting Pacing Amplitude: 2.5 V
Lead Channel Setting Pacing Pulse Width: 0.4 ms
Lead Channel Setting Sensing Sensitivity: 1.2 mV

## 2022-02-11 ENCOUNTER — Ambulatory Visit (INDEPENDENT_AMBULATORY_CARE_PROVIDER_SITE_OTHER): Payer: Medicare PPO | Admitting: Cardiology

## 2022-02-11 ENCOUNTER — Encounter: Payer: Self-pay | Admitting: Cardiology

## 2022-02-11 VITALS — BP 143/80 | HR 64 | Ht 65.0 in | Wt 244.0 lb

## 2022-02-11 DIAGNOSIS — I5032 Chronic diastolic (congestive) heart failure: Secondary | ICD-10-CM

## 2022-02-11 DIAGNOSIS — I4819 Other persistent atrial fibrillation: Secondary | ICD-10-CM

## 2022-02-11 DIAGNOSIS — I259 Chronic ischemic heart disease, unspecified: Secondary | ICD-10-CM | POA: Diagnosis not present

## 2022-02-11 NOTE — Patient Instructions (Signed)
Medication Instructions:  ?Your physician recommends that you continue on your current medications as directed. Please refer to the Current Medication list given to you today. ? ?*If you need a refill on your cardiac medications before your next appointment, please call your pharmacy* ? ? ?Lab Work: ?None ordered. ? ?If you have labs (blood work) drawn today and your tests are completely normal, you will receive your results only by: ?MyChart Message (if you have MyChart) OR ?A paper copy in the mail ?If you have any lab test that is abnormal or we need to change your treatment, we will call you to review the results. ? ? ?Testing/Procedures: ?Your physician has requested that you have an echocardiogram. Echocardiography is a painless test that uses sound waves to create images of your heart. It provides your doctor with information about the size and shape of your heart and how well your heart?s chambers and valves are working. This procedure takes approximately one hour. There are no restrictions for this procedure.  ? ? ?Follow-Up: ?At [facility], you and your health needs are our priority.  As part of our continuing mission to provide you with exceptional heart care, we have created designated Provider Care Teams.  These Care Teams include your primary Cardiologist (physician) and Advanced Practice Providers (APPs -  Physician Assistants and Nurse Practitioners) who all work together to provide you with the care you need, when you need it. ? ?We recommend signing up for the patient portal called "MyChart".  Sign up information is provided on this After Visit Summary.  MyChart is used to connect with patients for Virtual Visits (Telemedicine).  Patients are able to view lab/test results, encounter notes, upcoming appointments, etc.  Non-urgent messages can be sent to your provider as well.   ?To learn more about what you can do with MyChart, go to [email or web address].   ? ?Your next appointment:   ?12  month(s) ? ?The format for your next appointment:   ?In Person ? ?Provider:   ?Allegra Lai, MD{ ? ? ?Important Information About Sugar ? ? ? ? ?  ?

## 2022-02-11 NOTE — Progress Notes (Signed)
? ?Electrophysiology Office Note ? ? ?Date:  02/11/2022  ? ?ID:  Carmen Ford, DOB [birth year], MRN [id number] ? ?PCP:  Dellia Beckwith, PA-C  ?Cardiologist:  Bettina Gavia ?Primary Electrophysiologist:  Thermon Zulauf Meredith Leeds, MD   ? ?No chief complaint on file. ? ?  ?History of Present Illness: ?Carmen Ford is a 76 y.o. female who is being seen today for the evaluation of atrial fibrillation at the request of Dellia Beckwith, *. Presenting today for electrophysiology evaluation.   ? ?She has a history significant for persistent atrial fibrillation, chronic diastolic heart failure, coronary artery disease, hypertension, hyperlipidemia, CKD stage III.  She was diagnosed with pneumonia and acute renal failure and was hospitalized.  She was noted to have an episodes of AV block.  She presented to her primary cardiologist office in atrial fibrillation.  She had multiple episodes of AV block and bradycardia in the setting of acute renal failure and hyper kalemia.  She is now status post [employer] dual-chamber pacemaker implanted 08/10/2019. ? ?Today, denies symptoms of palpitations, chest pain, orthopnea, PND, lower extremity edema, claudication, dizziness, presyncope, syncope, bleeding, or neurologic sequela. The patient is tolerating medications without difficulties.  She complains of shortness of breath and leg weakness.  Her shortness of breath occurs when she is exerting herself, not at rest.  She finds it difficult to walk into the office and back to the exam room due to her shortness of breath.  She states that she has been short of breath ever since her pacemaker was implanted. ? ?Past Medical History:  ?Diagnosis Date  ? Acquired cavovarus deformity of left foot 12/24/2017  ? Acute colitis 05/08/2017  ? Overview:  CT of the abdomen shows right colitis.  Mariona Scholes administer IV Flagyl and Cipro, clear liquid diet, check stool for c diff and culture.  Annalee Meyerhoff recheck lactic acid for sepsis.  Was supposed to have  colonoscopy recently but has not had one for years.    Last Assessment & Plan:  CT of the abdomen obtained showed right colitis.  Tolerated clear liquid diet.  States has had no further diarrhea si  ? Acute on chronic kidney failure ([place]) 05/08/2017  ? Last Assessment & Plan:  Patient presented to ER with diarrhea, renal panel indicated dehydration.  Reviewed past renal panels and creatinine is elevated from baseline of about 1.5 and now 2.3. Diarrhea has resolved per patient report and she is tolerating fluids, Lakaya Tolen advance to regular diabetic, cardiac diet. Plan: Resume home lasix lasix  Bun and creatine trending down Diarrhea resolved Tolerat  ? Acute renal failure superimposed on stage 4 chronic kidney disease ([place]) 05/08/2017  ? Last Assessment & Plan:  Recheck BMP and potassium and was trending favorably No change in meds until results known and is ok and aware to hold ARB and diuretic  ? Anemia   ? Anemia in chronic kidney disease 10/15/2016  ? Bradycardia   ? Overview:  Not on a beta blocker with sinus bradycardia and Mobitz 1 second degree AV block  ? Bradycardia by electrocardiography 09/30/2018  ? Last Assessment & Plan:  Symptomatic and not paced and got well enough to d/c--Synai Prettyman see how it does going forward  Last Assessment & Plan:  Formatting of this note might be different from the original. To get paced--[place]  ? CHF (congestive heart failure) ([place]) 02/26/2016  ? Last Assessment & Plan:  Formatting of this note might be different from the original. Sees cardio and Ruth Kovich do  BNP today  ? Chronic diastolic heart failure ([place]) 02/26/2016  ? Overview:  Overview:  EF normal by echo 2013  ? Chronic ischemic heart disease   ? CKD (chronic kidney disease) 05/15/2017  ? Colitis, acute   ? Coronary artery disease   ? CABG  2011: LTA to LAD, SVG to M, SVG to PDA EF 40%  ? Coronary artery disease involving native coronary artery of native heart with angina pectoris ([place]) 02/26/2016  ? Degenerative arthritis of left  knee 06/09/2017  ? Diabetes mellitus without complication ([place])   ? type II   ? Diabetic peripheral neuropathy ([place]) 12/14/2018  ? Last Assessment & Plan:  Sees endo They adjust her insulin  Last Assessment & Plan:  Formatting of this note might be different from the original. Sees endo  ? Encounter for routine adult physical exam with abnormal findings 01/13/2017  ? Last Assessment & Plan:  Formatting of this note might be different from the original. Declines MMG Needs labs I have nothing and she does remember the A1C was good  No change meds and reconciled and cleaned up the problem list  6 months f/u  ? Essential (primary) hypertension 05/15/2017  ? Essential hypertension 05/15/2017  ? Last Assessment & Plan:  Do not restart ARB. Avoid beta-blockers, diltiazem, verapamil.  Start amlodipine today  Last Assessment & Plan:  Formatting of this note might be different from the original. Doing well and no changes in treatment  ? Fatigue 10/15/2016  ? Hammer toe 12/24/2017  ? History of total knee arthroplasty 06/30/2018  ? Hx of CABG 02/26/2016  ? Overview:  2011, LTA to LAD, SVG to M, SVG to PDA EF 40%  Formatting of this note might be different from the original. 2011, LTA to LAD, SVG to M, SVG to PDA EF 40%  ? Hyperkalemia 09/30/2018  ? Last Assessment & Plan:  Mild.  Improved.  Continue to follow.  May have been contributing to some of her rhythm disturbances, but I agree likely not the primary cause.  ? Hyperlipidemia 05/15/2017  ? Hypertensive heart disease with heart failure ([place])   ? Hypomagnesemia 05/09/2017  ? Last Assessment & Plan:  Magnesium level 1.7 likely secondary to diarrhea loss Plan: Replaced   ? Metatarsalgia 12/24/2017  ? Morbid obesity ([place]) 04/20/2019  ? Formatting of this note might be different from the original. Advised to work on portion control Try to remain active  ? Myocardial infarction [facility])   ? Osteoarthritis of left knee 06/09/2017  ? Persistent atrial fibrillation ([place]) 12/11/2018  ?  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 Restless legs syndrome 05/15/2017  ? Rupture of left patellar tendon 07/03/2017  ? SSS (sick sinus syndrome) ([place]) 08/10/2019  ? Tendinopathy 12/24/2017  ? Visual disturbance of one eye 09/30/2018  ? Last Assessment & Plan:  Formatting of this note might be different from the original. Had retinal surgery and cataract surgery and so so results  ? Vitamin D deficiency 05/15/2017  ? ?Past Surgical History:  ?Procedure Laterality Date  ? ACHILLES TENDON SURGERY Left 2005  ? inserted a new tendon  ? CARDIAC SURGERY    ? CATARACT EXTRACTION, BILATERAL    ? CHOLECYSTECTOMY  02/2016  ? COLONOSCOPY    ? CORONARY ARTERY BYPASS GRAFT  2011  ? [facility]  ? KNEE ARTHROPLASTY Left 06/09/2017  ? Procedure: LEFT TOTAL KNEE ARTHROPLASTY WITH COMPUTER NAVIGATION;  Surgeon: Rod Can, MD;  Location: [place];  Service: Orthopedics;  Laterality: Left;  NEED RNFA  ? PACEMAKER IMPLANT N/A 08/10/2019  ? Procedure: PACEMAKER IMPLANT;  Surgeon: Constance Haw, MD;  Location: [place] CV LAB;  Service: Cardiovascular;  Laterality: N/A;  ? PATELLAR TENDON REPAIR Left 07/03/2017  ? Procedure: PATELLA TENDON REPAIR LEFT KNEE;  Surgeon: Rod Can, MD;  Location: [facility];  Service: Orthopedics;  Laterality: Left;  ? VARICOSE VEIN SURGERY Bilateral 1990's  ? ? ? ?Current Outpatient Medications  ?Medication Sig Dispense Refill  ? acetaminophen (TYLENOL) 325 MG tablet Take 1-2 tablets (325-650 mg total) by mouth every 4 (four) hours as needed for mild pain.    ? amLODipine (NORVASC) 10 MG tablet Take 10 mg by mouth every other day.    ? carvedilol (COREG) 25 MG tablet Take 0.5 tablets (12.5 mg total) by mouth 2 (two) times daily with a meal. 90 tablet 3  ? Cholecalciferol 50 MCG (2000 UT) CAPS Take 2,000 mg by mouth daily.    ? ELIQUIS 5 MG TABS tablet Take 1 tablet by mouth twice daily 180 tablet 3  ? furosemide (LASIX) 20 MG tablet Take 20 mg by mouth daily as needed for edema.     ? gabapentin  (NEURONTIN) 400 MG capsule Take 400 mg by mouth in the morning and at bedtime.    ? insulin aspart (NOVOLOG FLEXPEN) 100 UNIT/ML FlexPen 3 (three) times daily.    ? insulin glargine (LANTUS) 100 UNIT/ML injection Inject 9 Units

## 2022-02-15 ENCOUNTER — Ambulatory Visit (INDEPENDENT_AMBULATORY_CARE_PROVIDER_SITE_OTHER): Payer: Medicare PPO

## 2022-02-15 DIAGNOSIS — I4819 Other persistent atrial fibrillation: Secondary | ICD-10-CM

## 2022-02-15 DIAGNOSIS — I5032 Chronic diastolic (congestive) heart failure: Secondary | ICD-10-CM | POA: Diagnosis not present

## 2022-02-15 DIAGNOSIS — I259 Chronic ischemic heart disease, unspecified: Secondary | ICD-10-CM | POA: Diagnosis not present

## 2022-02-15 LAB — ECHOCARDIOGRAM COMPLETE
Area-P 1/2: 3.27 cm2
S' Lateral: 4.4 cm

## 2022-02-18 ENCOUNTER — Ambulatory Visit: Payer: Medicare PPO | Admitting: Cardiology

## 2022-02-21 NOTE — Progress Notes (Signed)
Remote pacemaker transmission.   

## 2022-02-26 ENCOUNTER — Telehealth: Payer: Self-pay | Admitting: Cardiology

## 2022-02-26 ENCOUNTER — Other Ambulatory Visit: Payer: Self-pay

## 2022-02-26 DIAGNOSIS — Z79899 Other long term (current) drug therapy: Secondary | ICD-10-CM

## 2022-02-26 DIAGNOSIS — I5032 Chronic diastolic (congestive) heart failure: Secondary | ICD-10-CM

## 2022-02-26 MED ORDER — LOSARTAN POTASSIUM 50 MG PO TABS: 50.0000 mg | ORAL_TABLET | Freq: Every day | ORAL | 3 refills | Status: DC

## 2022-02-26 NOTE — Telephone Encounter (Signed)
Patient was calling back with question from the results. Please advise ?

## 2022-02-27 NOTE — Telephone Encounter (Signed)
Pt will stop by the [place] office after 5/10 for blood work. ?Aware she and Dr. Bettina Gavia will discuss when to schedule follow up echocardiogram when she sees him in  June. ?Patient verbalized understanding and agreeable to plan.  ? ?

## 2022-02-27 NOTE — Telephone Encounter (Signed)
Followed up with pt. ?Questions answered, educated as to why starting Losartan. ?Advised to monitor BP during first few weeks of therapy and/or if symptomatic after starting medication. ?Aware I will discuss when follow up echocardiogram should be ordered.  Informed that most likely she will see [facility] in June (already scheduled) and he would decide then when repeat testing should be performed. ?Patient verbalized understanding and agreeable to plan.  ? ?Also Dr. Curt Bears should we get a follow up BMET after starting Losartan medication? ?

## 2022-03-16 LAB — BASIC METABOLIC PANEL
BUN/Creatinine Ratio: 19 (ref 12–28)
BUN: 25 mg/dL (ref 8–27)
CO2: 22 mmol/L (ref 20–29)
Calcium: 8.8 mg/dL (ref 8.7–10.3)
Chloride: 103 mmol/L (ref 96–106)
Creatinine, Ser: 1.35 mg/dL — ABNORMAL HIGH (ref 0.57–1.00)
Glucose: 76 mg/dL (ref 70–99)
Potassium: 5 mmol/L (ref 3.5–5.2)
Sodium: 141 mmol/L (ref 134–144)
eGFR: 41 mL/min/{1.73_m2} — ABNORMAL LOW (ref >59)

## 2022-03-19 NOTE — Progress Notes (Signed)
Pt has been made aware of normal result and verbalized understanding.  jw

## 2022-04-17 ENCOUNTER — Encounter: Payer: Self-pay | Admitting: Cardiology

## 2022-04-17 ENCOUNTER — Ambulatory Visit: Payer: Medicare PPO | Admitting: Cardiology

## 2022-04-17 VITALS — BP 152/64 | HR 65 | Ht 65.0 in | Wt 244.2 lb

## 2022-04-17 DIAGNOSIS — I110 Hypertensive heart disease with heart failure: Secondary | ICD-10-CM

## 2022-04-17 DIAGNOSIS — D649 Anemia, unspecified: Secondary | ICD-10-CM

## 2022-04-17 DIAGNOSIS — I480 Paroxysmal atrial fibrillation: Secondary | ICD-10-CM

## 2022-04-17 DIAGNOSIS — Z7901 Long term (current) use of anticoagulants: Secondary | ICD-10-CM

## 2022-04-17 DIAGNOSIS — I25119 Atherosclerotic heart disease of native coronary artery with unspecified angina pectoris: Secondary | ICD-10-CM

## 2022-04-17 DIAGNOSIS — I495 Sick sinus syndrome: Secondary | ICD-10-CM

## 2022-04-17 DIAGNOSIS — N1832 Chronic kidney disease, stage 3b: Secondary | ICD-10-CM

## 2022-04-17 DIAGNOSIS — E782 Mixed hyperlipidemia: Secondary | ICD-10-CM

## 2022-04-17 NOTE — Addendum Note (Signed)
Addended by: Edwyna Shell I on: 04/17/2022 08:37 AM   Modules accepted: Orders

## 2022-04-17 NOTE — Progress Notes (Signed)
Cardiology Office Note:    Date:  04/17/2022   ID:  Carmen Ford, DOB [birth year], MRN [id number]  PCP:  Dellia Beckwith, PA-C  Cardiologist:  Shirlee More, MD    Referring MD: Dellia Beckwith, *    ASSESSMENT:    1. Coronary artery disease involving native coronary artery of native heart with angina pectoris ([place])   2. Hypertensive heart disease with heart failure ([place])   3. SSS (sick sinus syndrome) ([facility])   4. Paroxysmal atrial fibrillation ([place])   5. Chronic anticoagulation   6. Stage 3b chronic kidney disease ([place])   7. Mixed hyperlipidemia   8. Anemia, unspecified type    PLAN:    In order of problems listed above:  From cardiology perspective she is stable having no anginal discomfort no evidence of fluid overload normal pacemaker function followed by device clinic and continued anticoagulation She was anemic in December we will recheck her CBC especially with her exercise intolerance and exertional fatigue as well as her kidney function stage III CKD.  She is not on lipid-lowering therapy and recheck a lipid profile. Medications reviewed continue current medical regimen we will plan to see him in 6 months   Next appointment: 6 months   Medication Adjustments/Labs and Tests Ordered: Current medicines are reviewed at length with the patient today.  Concerns regarding medicines are outlined above.  No orders of the defined types were placed in this encounter.  No orders of the defined types were placed in this encounter.   No chief complaint on file.   History of Present Illness:    Carmen Ford is a 76 y.o. 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  She had labs done with [facility] December she was anemic  hemoglobin 9.8 hematocrit 29.8 platelets normal 197,000.  TSH normal 2.52  Compliance with diet, lifestyle and medications: Yes  She walks in with plans for driver's license told her can be done during office visit no report but I can do is that this cardiovascular.  Unfortunately this time she does not have a PCP  From cardiology perspective overall is doing well she is fatigued and short of breath when she walks a distance such as in the store but no edema orthopnea chest pain palpitation or syncope.  Review of her labs show she is anemic She has had no bleeding with her anticoagulant At this time is not on lipid-lowering therapy being statin intolerant.   Past Medical History:  Diagnosis Date   Acquired cavovarus deformity of left foot 12/24/2017   Acute colitis 05/08/2017   Overview:  CT of the abdomen shows right colitis.  Will administer IV Flagyl and Cipro, clear liquid diet, check stool for c diff and culture.  Will recheck lactic acid for sepsis.  Was supposed to have colonoscopy recently but has not had one for years.    Last Assessment & Plan:  CT of the abdomen obtained showed right colitis.  Tolerated clear liquid diet.  States has had no further diarrhea si   Acute on chronic kidney failure ([place]) 05/08/2017   Last Assessment & Plan:  Patient presented to ER with diarrhea, renal panel indicated dehydration.  Reviewed past renal panels and creatinine is elevated from baseline of about 1.5 and now 2.3. Diarrhea  has resolved per patient report and she is tolerating fluids, will advance to regular diabetic, cardiac diet. Plan: Resume home lasix lasix  Bun and creatine trending down Diarrhea resolved Tolerat   Acute renal failure superimposed on stage 4 chronic kidney disease ([place]) 05/08/2017   Last Assessment & Plan:  Recheck BMP and potassium and was trending favorably No change in meds until results known and is ok and aware to hold ARB and diuretic   Anemia    Anemia in chronic kidney  disease 10/15/2016   Bradycardia    Overview:  Not on a beta blocker with sinus bradycardia and Mobitz 1 second degree AV block   Bradycardia by electrocardiography 09/30/2018   Last Assessment & Plan:  Symptomatic and not paced and got well enough to d/c--will see how it does going forward  Last Assessment & Plan:  Formatting of this note might be different from the original. To get paced--[place]   CHF (congestive heart failure) ([place]) 02/26/2016   Last Assessment & Plan:  Formatting of this note might be different from the original. Sees cardio and will do BNP today   Chronic diastolic heart failure ([place]) 02/26/2016   Overview:  Overview:  EF normal by echo 2013   Chronic ischemic heart disease    CKD (chronic kidney disease) 05/15/2017   Colitis, acute    Coronary artery disease    CABG  2011: LTA to LAD, SVG to M, SVG to PDA EF 40%   Coronary artery disease involving native coronary artery of native heart with angina pectoris ([place]) 02/26/2016   Degenerative arthritis of left knee 06/09/2017   Diabetes mellitus without complication ([place])    type II    Diabetic peripheral neuropathy ([place]) 12/14/2018   Last Assessment & Plan:  Sees endo They adjust her insulin  Last Assessment & Plan:  Formatting of this note might be different from the original. Sees endo   Encounter for routine adult physical exam with abnormal findings 01/13/2017   Last Assessment & Plan:  Formatting of this note might be different from the original. Declines MMG Needs labs I have nothing and she does remember the A1C was good  No change meds and reconciled and cleaned up the problem list  6 months f/u   Essential (primary) hypertension 05/15/2017   Essential hypertension 05/15/2017   Last Assessment & Plan:  Do not restart ARB. Avoid beta-blockers, diltiazem, verapamil.  Start amlodipine today  Last Assessment & Plan:  Formatting of this note might be different from the original. Doing well and no changes in treatment    Fatigue 10/15/2016   Hammer toe 12/24/2017   History of total knee arthroplasty 06/30/2018   Hx of CABG 02/26/2016   Overview:  2011, LTA to LAD, SVG to M, SVG to PDA EF 40%  Formatting of this note might be different from the original. 2011, LTA to LAD, SVG to M, SVG to PDA EF 40%   Hyperkalemia 09/30/2018   Last Assessment & Plan:  Mild.  Improved.  Continue to follow.  May have been contributing to some of her rhythm disturbances, but I agree likely not the primary cause.   Hyperlipidemia 05/15/2017   Hypertensive heart disease with heart failure ([place])    Hypomagnesemia 05/09/2017   Last Assessment & Plan:  Magnesium level 1.7 likely secondary to diarrhea loss Plan: Replaced    Metatarsalgia 12/24/2017   Morbid obesity ([place]) 04/20/2019   Formatting of this note might be different from the  original. 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 Had retinal surgery and cataract surgery and so so results   Vitamin D deficiency 05/15/2017    Past Surgical History:  Procedure Laterality Date   ACHILLES TENDON SURGERY Left 2005   inserted a new tendon   CARDIAC SURGERY     CATARACT EXTRACTION, BILATERAL     CHOLECYSTECTOMY  02/2016   COLONOSCOPY     CORONARY ARTERY BYPASS GRAFT  2011   [facility]   KNEE ARTHROPLASTY Left 06/09/2017   Procedure: LEFT TOTAL KNEE ARTHROPLASTY WITH COMPUTER NAVIGATION;  Surgeon: Rod Can, MD;  Location: [place];  Service: Orthopedics;  Laterality: Left;  NEED RNFA   PACEMAKER IMPLANT N/A 08/10/2019   Procedure:  PACEMAKER IMPLANT;  Surgeon: Constance Haw, MD;  Location: [place] CV LAB;  Service: Cardiovascular;  Laterality: N/A;   PATELLAR TENDON REPAIR Left 07/03/2017   Procedure: PATELLA TENDON REPAIR LEFT KNEE;  Surgeon: Rod Can, MD;  Location[facility];  Service: Orthopedics;  Laterality: Left;   VARICOSE VEIN SURGERY Bilateral 1990's    Current Medications: Current Meds  Medication Sig   acetaminophen (TYLENOL) 325 MG tablet Take 1-2 tablets (325-650 mg total) by mouth every 4 (four) hours as needed for mild pain.   amLODipine (NORVASC) 10 MG tablet Take 10 mg by mouth every other day.   carvedilol (COREG) 25 MG tablet Take 0.5 tablets (12.5 mg total) by mouth 2 (two) times daily with a meal.   Cholecalciferol 50 MCG (2000 UT) CAPS Take 2,000 mg by mouth daily.   ELIQUIS 5 MG TABS tablet Take 1 tablet by mouth twice daily   furosemide (LASIX) 20 MG tablet Take 20 mg by mouth daily as needed for edema.    gabapentin (NEURONTIN) 400 MG capsule Take 400 mg by mouth in the morning and at bedtime.   insulin aspart (NOVOLOG FLEXPEN) 100 UNIT/ML FlexPen 3 (three) times daily.   insulin glargine (LANTUS) 100 UNIT/ML injection Inject 9 Units into the skin 2 (two) times daily.    losartan (COZAAR) 50 MG tablet Take 1 tablet (50 mg total) by mouth daily.   omega-3 acid ethyl esters (LOVAZA) 1 G capsule Take 2 g by mouth 2 (two) times daily.    rOPINIRole (REQUIP) 2 MG tablet Take 2 mg by mouth at bedtime.      Allergies:   Penicillins and Statins   Social History   Socioeconomic History   Marital status: Married    Spouse name: Not on file   Number of children: Not on file   Years of education: Not on file   Highest education level: Not on file  Occupational History   Not on file  Tobacco Use   Smoking status: Never   Smokeless tobacco: Never  Vaping Use   Vaping Use: Never used  Substance and Sexual Activity   Alcohol use: No    Alcohol/week: 0.0 standard drinks of  alcohol   Drug use: No   Sexual activity: Not on file  Other Topics  Concern   Not on file  Social History Narrative   Not on file   Social Determinants of Health   Financial Resource Strain: Not on file  Food Insecurity: Not on file  Transportation Needs: Not on file  Physical Activity: Not on file  Stress: Not on file  Social Connections: Not on file     Family History: The patient's changes family history includes Cancer in her brother; Congestive Heart Failure in her father; Diabetes in her mother; Heart failure in her mother; Hypertension in her mother and son. ROS:   Please see the history of present illness.    All other systems reviewed and are negative.  EKGs/Labs/Other Studies Reviewed:    The following studies were reviewed today:  EKG:  EKG performed 02/13/2022 shows dual-chamber paced rhythm  Recent Labs: 03/15/2022: BUN 25; Creatinine, Ser 1.35; Potassium 5.0; Sodium 141  Recent Lipid Panel No results found for: "CHOL", "TRIG", "HDL", "CHOLHDL", "VLDL", "LDLCALC", "LDLDIRECT"  Physical Exam:    VS:  BP (!) 152/64 (BP Location: Left Arm, Patient Position: Sitting)   Pulse 65   Ht 5\' 5"  (1.651 m)   Wt 244 lb 3.2 oz (110.8 kg)   SpO2 98%   BMI 40.64 kg/m     Wt Readings from Last 3 Encounters:  04/17/22 244 lb 3.2 oz (110.8 kg)  02/11/22 244 lb (110.7 kg)  10/10/21 247 lb (112 kg)     GEN:  Well nourished, well developed in no acute distress HEENT: Normal NECK: No JVD; No carotid bruits LYMPHATICS: No lymphadenopathy CARDIAC: No RRR, no murmurs, rubs, gallops RESPIRATORY:  Clear to auscultation without rales, wheezing or rhonchi  ABDOMEN: Soft, non-tender, non-distended MUSCULOSKELETAL:  No edema; No deformity  SKIN: Warm and dry NEUROLOGIC:  Alert and oriented x 3 PSYCHIATRIC:  Normal affect    Signed, Shirlee More, MD  04/17/2022 8:18 AM    [place]

## 2022-04-17 NOTE — Patient Instructions (Signed)
Medication Instructions:  Your physician recommends that you continue on your current medications as directed. Please refer to the Current Medication list given to you today.  *If you need a refill on your cardiac medications before your next appointment, please call your pharmacy*   Lab Work: Your physician recommends that you return for lab work in:   Labs today: CBC, Pro BNP, CMP, Lipids  If you have labs (blood work) drawn today and your tests are completely normal, you will receive your results only by: MyChart Message (if you have MyChart) OR A paper copy in the mail If you have any lab test that is abnormal or we need to change your treatment, we will call you to review the results.   Testing/Procedures: None   Follow-Up: At [facility], you and your health needs are our priority.  As part of our continuing mission to provide you with exceptional heart care, we have created designated Provider Care Teams.  These Care Teams include your primary Cardiologist (physician) and Advanced Practice Providers (APPs -  Physician Assistants and Nurse Practitioners) who all work together to provide you with the care you need, when you need it.  We recommend signing up for the patient portal called "MyChart".  Sign up information is provided on this After Visit Summary.  MyChart is used to connect with patients for Virtual Visits (Telemedicine).  Patients are able to view lab/test results, encounter notes, upcoming appointments, etc.  Non-urgent messages can be sent to your provider as well.   To learn more about what you can do with MyChart, go to [email or web address].    Your next appointment:   6 month(s)  The format for your next appointment:   In Person  Provider:   Shirlee More, MD    Other Instructions None  Important Information About Sugar

## 2022-04-18 LAB — CBC
Hematocrit: 29.8 % — ABNORMAL LOW (ref 34.0–46.6)
Hemoglobin: 9.9 g/dL — ABNORMAL LOW (ref 11.1–15.9)
MCH: 28.6 pg (ref 26.6–33.0)
MCHC: 33.2 g/dL (ref 31.5–35.7)
MCV: 86 fL (ref 79–97)
Platelets: 188 10*3/uL (ref 150–450)
RBC: 3.46 x10E6/uL — ABNORMAL LOW (ref 3.77–5.28)
RDW: 13.7 % (ref 11.7–15.4)
WBC: 5.4 10*3/uL (ref 3.4–10.8)

## 2022-04-18 LAB — LIPID PANEL
Chol/HDL Ratio: 5.8 ratio — ABNORMAL HIGH (ref 0.0–4.4)
Cholesterol, Total: 184 mg/dL (ref 100–199)
HDL: 32 mg/dL — ABNORMAL LOW (ref >39)
LDL Chol Calc (NIH): 130 mg/dL — ABNORMAL HIGH (ref 0–99)
Triglycerides: 121 mg/dL (ref 0–149)
VLDL Cholesterol Cal: 22 mg/dL (ref 5–40)

## 2022-04-18 LAB — COMPREHENSIVE METABOLIC PANEL
ALT: 10 IU/L (ref 0–32)
AST: 16 IU/L (ref 0–40)
Albumin/Globulin Ratio: 1.4 (ref 1.2–2.2)
Albumin: 3.6 g/dL — ABNORMAL LOW (ref 3.7–4.7)
Alkaline Phosphatase: 54 IU/L (ref 44–121)
BUN/Creatinine Ratio: 14 (ref 12–28)
BUN: 22 mg/dL (ref 8–27)
Bilirubin Total: 0.4 mg/dL (ref 0.0–1.2)
CO2: 24 mmol/L (ref 20–29)
Calcium: 8.4 mg/dL — ABNORMAL LOW (ref 8.7–10.3)
Chloride: 98 mmol/L (ref 96–106)
Creatinine, Ser: 1.52 mg/dL — ABNORMAL HIGH (ref 0.57–1.00)
Globulin, Total: 2.6 g/dL (ref 1.5–4.5)
Glucose: 111 mg/dL — ABNORMAL HIGH (ref 70–99)
Potassium: 5.7 mmol/L — ABNORMAL HIGH (ref 3.5–5.2)
Sodium: 134 mmol/L (ref 134–144)
Total Protein: 6.2 g/dL (ref 6.0–8.5)
eGFR: 36 mL/min/{1.73_m2} — ABNORMAL LOW (ref >59)

## 2022-04-18 LAB — PRO B NATRIURETIC PEPTIDE: NT-Pro BNP: 1339 pg/mL — ABNORMAL HIGH (ref 0–738)

## 2022-04-19 ENCOUNTER — Telehealth: Payer: Self-pay

## 2022-04-19 ENCOUNTER — Telehealth: Payer: Self-pay | Admitting: Cardiology

## 2022-04-19 DIAGNOSIS — D649 Anemia, unspecified: Secondary | ICD-10-CM

## 2022-04-19 DIAGNOSIS — R5383 Other fatigue: Secondary | ICD-10-CM

## 2022-04-19 NOTE — Telephone Encounter (Signed)
Spoke with pt regarding lab results. Sent referral to Dr. Lewis/Dr. Hinton Rao hematology for anemia. Pt agreed and had no further questions.

## 2022-04-19 NOTE — Telephone Encounter (Signed)
Pot returning a call regarding results. Please advise

## 2022-04-22 ENCOUNTER — Telehealth: Payer: Self-pay | Admitting: Cardiology

## 2022-04-22 ENCOUNTER — Other Ambulatory Visit: Payer: Self-pay | Admitting: Hematology and Oncology

## 2022-04-22 ENCOUNTER — Other Ambulatory Visit: Payer: Self-pay

## 2022-04-22 DIAGNOSIS — D509 Iron deficiency anemia, unspecified: Secondary | ICD-10-CM

## 2022-04-22 NOTE — Telephone Encounter (Signed)
Pt calling to f/u on paperwork that she dropped off on 04/17/22. Please advise

## 2022-04-23 ENCOUNTER — Other Ambulatory Visit: Payer: Self-pay

## 2022-04-23 ENCOUNTER — Inpatient Hospital Stay: Payer: Medicare PPO

## 2022-04-23 ENCOUNTER — Encounter: Payer: Self-pay | Admitting: Hematology and Oncology

## 2022-04-23 ENCOUNTER — Other Ambulatory Visit: Payer: Self-pay | Admitting: Hematology and Oncology

## 2022-04-23 ENCOUNTER — Inpatient Hospital Stay: Payer: Medicare PPO | Attending: Hematology and Oncology | Admitting: Hematology and Oncology

## 2022-04-23 VITALS — BP 144/63 | HR 67 | Temp 98.6°F | Resp 20 | Ht 65.0 in | Wt 247.6 lb

## 2022-04-23 DIAGNOSIS — Z7901 Long term (current) use of anticoagulants: Secondary | ICD-10-CM | POA: Insufficient documentation

## 2022-04-23 DIAGNOSIS — I130 Hypertensive heart and chronic kidney disease with heart failure and stage 1 through stage 4 chronic kidney disease, or unspecified chronic kidney disease: Secondary | ICD-10-CM | POA: Insufficient documentation

## 2022-04-23 DIAGNOSIS — Z951 Presence of aortocoronary bypass graft: Secondary | ICD-10-CM | POA: Diagnosis not present

## 2022-04-23 DIAGNOSIS — E785 Hyperlipidemia, unspecified: Secondary | ICD-10-CM | POA: Insufficient documentation

## 2022-04-23 DIAGNOSIS — I509 Heart failure, unspecified: Secondary | ICD-10-CM | POA: Insufficient documentation

## 2022-04-23 DIAGNOSIS — I2510 Atherosclerotic heart disease of native coronary artery without angina pectoris: Secondary | ICD-10-CM | POA: Diagnosis not present

## 2022-04-23 DIAGNOSIS — D649 Anemia, unspecified: Secondary | ICD-10-CM | POA: Diagnosis present

## 2022-04-23 DIAGNOSIS — R5383 Other fatigue: Secondary | ICD-10-CM

## 2022-04-23 DIAGNOSIS — E1122 Type 2 diabetes mellitus with diabetic chronic kidney disease: Secondary | ICD-10-CM | POA: Insufficient documentation

## 2022-04-23 DIAGNOSIS — Z794 Long term (current) use of insulin: Secondary | ICD-10-CM | POA: Insufficient documentation

## 2022-04-23 DIAGNOSIS — R0602 Shortness of breath: Secondary | ICD-10-CM | POA: Insufficient documentation

## 2022-04-23 DIAGNOSIS — G2581 Restless legs syndrome: Secondary | ICD-10-CM | POA: Diagnosis not present

## 2022-04-23 DIAGNOSIS — D509 Iron deficiency anemia, unspecified: Secondary | ICD-10-CM

## 2022-04-23 DIAGNOSIS — I252 Old myocardial infarction: Secondary | ICD-10-CM | POA: Insufficient documentation

## 2022-04-23 DIAGNOSIS — N184 Chronic kidney disease, stage 4 (severe): Secondary | ICD-10-CM | POA: Diagnosis not present

## 2022-04-23 DIAGNOSIS — I4819 Other persistent atrial fibrillation: Secondary | ICD-10-CM | POA: Insufficient documentation

## 2022-04-23 DIAGNOSIS — Z79899 Other long term (current) drug therapy: Secondary | ICD-10-CM | POA: Insufficient documentation

## 2022-04-23 LAB — CBC AND DIFFERENTIAL
HCT: 31 — AB (ref 36–46)
Hemoglobin: 10.5 — AB (ref 12.0–16.0)
Neutrophils Absolute: 4.62
Platelets: 259 10*3/uL (ref 150–400)
WBC: 6.9

## 2022-04-23 LAB — IRON AND TIBC
Iron: 84 ug/dL (ref 28–170)
Saturation Ratios: 23 % (ref 10.4–31.8)
TIBC: 371 ug/dL (ref 250–450)
UIBC: 287 ug/dL

## 2022-04-23 LAB — FERRITIN: Ferritin: 46 ng/mL (ref 11–307)

## 2022-04-23 LAB — COMPREHENSIVE METABOLIC PANEL
Albumin: 3.8 (ref 3.5–5.0)
Calcium: 8.2 — AB (ref 8.7–10.7)

## 2022-04-23 LAB — HEPATIC FUNCTION PANEL
ALT: 16 U/L (ref 7–35)
AST: 23 (ref 13–35)
Alkaline Phosphatase: 54 (ref 25–125)
Bilirubin, Total: 0.5

## 2022-04-23 LAB — BASIC METABOLIC PANEL
BUN: 23 — AB (ref 4–21)
CO2: 25 — AB (ref 13–22)
Chloride: 99 (ref 99–108)
Creatinine: 1.4 — AB (ref 0.5–1.1)
Glucose: 89
Potassium: 5.1 mEq/L (ref 3.5–5.1)
Sodium: 135 — AB (ref 137–147)

## 2022-04-23 LAB — CBC: RBC: 3.66 — AB (ref 3.87–5.11)

## 2022-04-23 NOTE — Telephone Encounter (Signed)
Patient informed of results.  

## 2022-04-23 NOTE — Progress Notes (Cosign Needed)
[facility]  [address] [place],  [place]  [address] [phone]  Clinic Day:  04/23/2022  Referring physician: Dellia Beckwith, *   REASON FOR CONSULTATION:    HISTORY OF PRESENT ILLNESS:  Carmen Ford is a 76 y.o. female with a history of anemia who is referred in consultation by Dawayne Patricia for assessment and management. She presented to her PCP for routine follow up and was found to be anemic with a hemoglobin of 9.8. She was referred to our office for further workup. She denies knowledge of anemia until now.   Today, she denies fever, chills, nausea or vomiting. She does have shortness of breath upon exertion without chest pain or cough. She describes weakness in her legs and not able to ambulate as far as she once did. She denies issue with bowel or bladder. Her appetite is fair and weight is stable. Medical history is significant for chronic kidney failure stage IV, CHF, chronic ischemia disease, CAD, arthritis, diabetes mellitus (type 2), hypertension, hyperlipidemia, myocardial infarction, persistent atrial fibrillation, restless leg syndrome, sinus sick syndrome, Vitamin D deficiency and visual disturbance of one eye. Surgical history is significant for achilles tendon surgery, cardiac surgery, cataract extraction, bilateral, cholecystectomy, colonoscopy, CABG, knee arthroplasty, pacemaker implant, patellar tendon repair and varicose vein surgery. Family history is significant for CHF, diabetes, heart failure, and hypertension. She is married and lives with her spouse. She has 2 children. She denies smoking, alcohol or substance abuse.    REVIEW OF SYSTEMS:  Review of Systems  Constitutional:  Positive for fatigue. Negative for appetite change, chills, diaphoresis, fever and unexpected weight change.  HENT:   Negative for hearing loss, lump/mass, mouth sores, nosebleeds, sore throat, tinnitus, trouble swallowing and voice change.    Eyes:  Negative for eye problems and icterus.  Respiratory:  Positive for shortness of breath. Negative for chest tightness, cough, hemoptysis and wheezing.   Cardiovascular:  Negative for chest pain, leg swelling and palpitations.  Gastrointestinal:  Negative for abdominal distention, abdominal pain, blood in stool, constipation, diarrhea, nausea, rectal pain and vomiting.  Endocrine: Negative for hot flashes.  Genitourinary:  Negative for bladder incontinence, difficulty urinating, dyspareunia, dysuria, frequency, hematuria and nocturia.   Musculoskeletal:  Negative for arthralgias, back pain, flank pain, gait problem, myalgias, neck pain and neck stiffness.  Skin:  Negative for itching, rash and wound.  Neurological:  Negative for dizziness, extremity weakness, gait problem, headaches, light-headedness, numbness, seizures and speech difficulty.  Hematological:  Negative for adenopathy. Does not bruise/bleed easily.  Psychiatric/Behavioral:  Negative for confusion, decreased concentration, depression, sleep disturbance and suicidal ideas. The patient is not nervous/anxious.      VITALS:  Blood pressure (!) 144/63, pulse 67, temperature 98.6 F (37 C), temperature source Oral, resp. rate 20, height '5\' 5"'  (1.651 m), weight 247 lb 9.6 oz (112.3 kg), SpO2 94 %.  Wt Readings from Last 3 Encounters:  04/23/22 247 lb 9.6 oz (112.3 kg)  04/17/22 244 lb 3.2 oz (110.8 kg)  02/11/22 244 lb (110.7 kg)    Body mass index is 41.2 kg/m.  Performance status (ECOG): 1 - Symptomatic but completely ambulatory  PHYSICAL EXAM:  Physical Exam Constitutional:      General: She is not in acute distress.    Appearance: Normal appearance. She is normal weight. She is not ill-appearing, toxic-appearing or diaphoretic.  HENT:     Head: Normocephalic and atraumatic.     Right Ear: Tympanic membrane normal.  Left Ear: Tympanic membrane normal.     Nose: Nose normal. No congestion or rhinorrhea.      Mouth/Throat:     Mouth: Mucous membranes are moist.     Pharynx: Oropharynx is clear. No oropharyngeal exudate or posterior oropharyngeal erythema.  Eyes:     General: No scleral icterus.       Right eye: No discharge.        Left eye: No discharge.     Extraocular Movements: Extraocular movements intact.     Conjunctiva/sclera: Conjunctivae normal.     Pupils: Pupils are equal, round, and reactive to light.  Neck:     Vascular: No carotid bruit.  Cardiovascular:     Rate and Rhythm: Normal rate and regular rhythm.     Heart sounds: No murmur heard.    No friction rub. No gallop.  Pulmonary:     Effort: Pulmonary effort is normal. No respiratory distress.     Breath sounds: Normal breath sounds. No stridor. No wheezing, rhonchi or rales.  Chest:     Chest wall: No tenderness.  Abdominal:     General: Abdomen is flat. Bowel sounds are normal. There is no distension.     Palpations: There is no mass.     Tenderness: There is no abdominal tenderness. There is no right CVA tenderness, left CVA tenderness, guarding or rebound.     Hernia: No hernia is present.  Musculoskeletal:        General: No swelling, tenderness, deformity or signs of injury. Normal range of motion.     Cervical back: Normal range of motion and neck supple. No rigidity or tenderness.     Right lower leg: No edema.     Left lower leg: No edema.  Lymphadenopathy:     Cervical: No cervical adenopathy.  Skin:    General: Skin is warm and dry.     Capillary Refill: Capillary refill takes less than 2 seconds.     Coloration: Skin is not jaundiced or pale.     Findings: No bruising, erythema, lesion or rash.  Neurological:     General: No focal deficit present.     Mental Status: She is alert and oriented to person, place, and time. Mental status is at baseline.     Cranial Nerves: No cranial nerve deficit.     Sensory: No sensory deficit.     Motor: No weakness.     Coordination: Coordination normal.     Gait:  Gait normal.     Deep Tendon Reflexes: Reflexes normal.  Psychiatric:        Mood and Affect: Mood normal.        Behavior: Behavior normal.        Thought Content: Thought content normal.        Judgment: Judgment normal.      LABS:      Latest Ref Rng & Units 04/23/2022   12:00 AM 04/17/2022    8:35 AM 04/04/2021    9:50 AM  CBC  WBC  6.9     5.4  5.7   Hemoglobin 12.0 - 16.0 10.5     9.9  10.4   Hematocrit 36 - 46 31     29.8  32.3   Platelets 150 - 400 K/uL 259     188  199      This result is from an external source.      Latest Ref Rng & Units 04/23/2022   12:00  AM 04/17/2022    8:35 AM 03/15/2022    1:10 PM  CMP  Glucose 70 - 99 mg/dL  111  76   BUN 4 - '21 23     22  25   ' Creatinine 0.5 - 1.1 1.4     1.52  1.35   Sodium 137 - 147 135     134  141   Potassium 3.5 - 5.1 mEq/L 5.1     5.7  5.0   Chloride 99 - 108 99     98  103   CO2 13 - '22 25     24  22   ' Calcium 8.7 - 10.7 8.2     8.4  8.8   Total Protein 6.0 - 8.5 g/dL  6.2    Total Bilirubin 0.0 - 1.2 mg/dL  0.4    Alkaline Phos 25 - 125 54     54    AST 13 - 35 23     16    ALT 7 - 35 U/L 16     10       This result is from an external source.     No results found for: "CEA1", "CEA" / No results found for: "CEA1", "CEA" No results found for: "PSA1" No results found for: "HDQ222" No results found for: "CAN125"  No results found for: "TOTALPROTELP", "ALBUMINELP", "A1GS", "A2GS", "BETS", "BETA2SER", "GAMS", "MSPIKE", "SPEI" Lab Results  Component Value Date   TIBC 371 04/23/2022   FERRITIN 46 04/23/2022   IRONPCTSAT 23 04/23/2022   No results found for: "LDH"  STUDIES:  No results found.    HISTORY:   Past Medical History:  Diagnosis Date   Acquired cavovarus deformity of left foot 12/24/2017   Acute colitis 05/08/2017   Overview:  CT of the abdomen shows right colitis.  Will administer IV Flagyl and Cipro, clear liquid diet, check stool for c diff and culture.  Will recheck lactic acid for  sepsis.  Was supposed to have colonoscopy recently but has not had one for years.    Last Assessment & Plan:  CT of the abdomen obtained showed right colitis.  Tolerated clear liquid diet.  States has had no further diarrhea si   Acute on chronic kidney failure ([place]) 05/08/2017   Last Assessment & Plan:  Patient presented to ER with diarrhea, renal panel indicated dehydration.  Reviewed past renal panels and creatinine is elevated from baseline of about 1.5 and now 2.3. Diarrhea has resolved per patient report and she is tolerating fluids, will advance to regular diabetic, cardiac diet. Plan: Resume home lasix lasix  Bun and creatine trending down Diarrhea resolved Tolerat   Acute renal failure superimposed on stage 4 chronic kidney disease ([place]) 05/08/2017   Last Assessment & Plan:  Recheck BMP and potassium and was trending favorably No change in meds until results known and is ok and aware to hold ARB and diuretic   Anemia    Anemia in chronic kidney disease 10/15/2016   Bradycardia    Overview:  Not on a beta blocker with sinus bradycardia and Mobitz 1 second degree AV block   Bradycardia by electrocardiography 09/30/2018   Last Assessment & Plan:  Symptomatic and not paced and got well enough to d/c--will see how it does going forward  Last Assessment & Plan:  Formatting of this note might be different from the original. To get paced--[place]   CHF (congestive heart failure) ([place]) 02/26/2016   Last  Assessment & Plan:  Formatting of this note might be different from the original. Sees cardio and will do BNP today   Chronic diastolic heart failure ([place]) 02/26/2016   Overview:  Overview:  EF normal by echo 2013   Chronic ischemic heart disease    CKD (chronic kidney disease) 05/15/2017   Colitis, acute    Coronary artery disease    CABG  2011: LTA to LAD, SVG to M, SVG to PDA EF 40%   Coronary artery disease involving native coronary artery of native heart with angina pectoris ([place]) 02/26/2016    Degenerative arthritis of left knee 06/09/2017   Diabetes mellitus without complication ([place])    type II    Diabetic peripheral neuropathy ([place]) 12/14/2018   Last Assessment & Plan:  Sees endo They adjust her insulin  Last Assessment & Plan:  Formatting of this note might be different from the original. Sees endo   Encounter for routine adult physical exam with abnormal findings 01/13/2017   Last Assessment & Plan:  Formatting of this note might be different from the original. Declines MMG Needs labs I have nothing and she does remember the A1C was good  No change meds and reconciled and cleaned up the problem list  6 months f/u   Essential (primary) hypertension 05/15/2017   Essential hypertension 05/15/2017   Last Assessment & Plan:  Do not restart ARB. Avoid beta-blockers, diltiazem, verapamil.  Start amlodipine today  Last Assessment & Plan:  Formatting of this note might be different from the original. Doing well and no changes in treatment   Fatigue 10/15/2016   Hammer toe 12/24/2017   History of total knee arthroplasty 06/30/2018   Hx of CABG 02/26/2016   Overview:  2011, LTA to LAD, SVG to M, SVG to PDA EF 40%  Formatting of this note might be different from the original. 2011, LTA to LAD, SVG to M, SVG to PDA EF 40%   Hyperkalemia 09/30/2018   Last Assessment & Plan:  Mild.  Improved.  Continue to follow.  May have been contributing to some of her rhythm disturbances, but I agree likely not the primary cause.   Hyperlipidemia 05/15/2017   Hypertensive heart disease with heart failure ([place])    Hypomagnesemia 05/09/2017   Last Assessment & Plan:  Magnesium level 1.7 likely secondary to diarrhea loss Plan: Replaced    Metatarsalgia 12/24/2017   Morbid obesity ([place]) 04/20/2019   Formatting of this note might be different from the original. 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 Had retinal surgery and cataract surgery and so so results   Vitamin D deficiency 05/15/2017    Past Surgical History:  Procedure Laterality Date   ACHILLES TENDON SURGERY Left 2005   inserted a new tendon   CARDIAC SURGERY     CATARACT EXTRACTION, BILATERAL     CHOLECYSTECTOMY  02/2016   COLONOSCOPY     CORONARY ARTERY BYPASS GRAFT  2011   [facility]   KNEE ARTHROPLASTY Left 06/09/2017   Procedure: LEFT TOTAL  KNEE ARTHROPLASTY WITH COMPUTER NAVIGATION;  Surgeon: Rod Can, MD;  Location: [facility];  Service: Orthopedics;  Laterality: Left;  NEED RNFA   PACEMAKER IMPLANT N/A 08/10/2019   Procedure: PACEMAKER IMPLANT;  Surgeon: Constance Haw, MD;  Location: [facility];  Service: Cardiovascular;  Laterality: N/A;   PATELLAR TENDON REPAIR Left 07/03/2017   Procedure: PATELLA TENDON REPAIR LEFT KNEE;  Surgeon: Rod Can, MD;  Location: [facility];  Service: Orthopedics;  Laterality: Left;   VARICOSE VEIN SURGERY Bilateral 45's    Family History  Problem Relation Age of Onset   Diabetes Mother    Hypertension Mother    Heart failure Mother    Congestive Heart Failure Father    Cancer Brother    Hypertension Son     Social History:  reports that she has never smoked. She has never used smokeless tobacco. She reports that she does not drink alcohol and does not use drugs.The patient is accompanied by wife today.  Allergies:  Allergies  Allergen Reactions   Penicillins Other (See Comments)    Tiredness Has patient had a PCN reaction causing immediate rash, facial/tongue/throat swelling, SOB or lightheadedness with hypotension:No Has  patient had a PCN reaction causing severe rash involving mucus membranes or skin necrosis: No Has patient had a PCN reaction that required hospitalization: No Has patient had a PCN reaction occurring within the last 10 years: No If all of the above answers are "NO", then may proceed with Cephalosporin use.    Statins Other (See Comments)    myalgias    Current Medications: Current Outpatient Medications  Medication Sig Dispense Refill   acetaminophen (TYLENOL) 325 MG tablet Take 1-2 tablets (325-650 mg total) by mouth every 4 (four) hours as needed for mild pain.     amLODipine (NORVASC) 10 MG tablet Take 10 mg by mouth every other day.     carvedilol (COREG) 25 MG tablet Take 0.5 tablets (12.5 mg total) by mouth 2 (two) times daily with a meal. 90 tablet 3   Cholecalciferol (VITAMIN D-1000 MAX ST) 25 MCG (1000 UT) tablet Take by mouth.     ELIQUIS 5 MG TABS tablet Take 1 tablet by mouth twice daily 180 tablet 3   furosemide (LASIX) 20 MG tablet Take 20 mg by mouth daily as needed for edema.      gabapentin (NEURONTIN) 400 MG capsule Take 400 mg by mouth in the morning and at bedtime.     Glucagon 1 MG/0.2ML SOLN Glucagon Emergency Kit 1 mg solution for injection     insulin aspart (NOVOLOG FLEXPEN) 100 UNIT/ML FlexPen 3 (three) times daily.     insulin glargine (LANTUS) 100 UNIT/ML injection Inject 9 Units into the skin 2 (two) times daily.      omega-3 acid ethyl esters (LOVAZA) 1 G capsule Take 2 g by mouth 2 (two) times daily.      rOPINIRole (REQUIP) 2 MG tablet Take 2 mg by mouth at bedtime.      No current facility-administered medications for this visit.     ASSESSMENT & PLAN:   Assessment:  Carmen Ford is a 76 y.o. 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 She reports increasing fatigue and shortness of breath. She denies iron use or blood transfusions in the past. She denies any signs of bleeding including blood in her stool. She has stopped  having periods for several years. CBC today reveals hemoglobin 10.5, hematocrit 31.2, MCV 85.  Iron studies including ferritin, vitamin B12 and folate.We discussed that the iron studies will help [place] determine treatment which would most likely include iron infusions.   Plan: 1.  We have obtained iron studies as well as nutritional studies. Once these results have been obtained, we will decide upon a treatment plan which will most likely include iron infusions. She will return to clinic in 6 weeks post last iron infusions.   I discussed the assessment and treatment plan with the patient.  The patient was provided an opportunity to ask questions and all were answered.  The patient agreed with the plan and demonstrated an understanding of the instructions.  The patient was advised to call back if the symptoms worsen or if the condition fails to improve as anticipated.  Thank you for the opportunity     Melodye Ped, NP

## 2022-04-25 ENCOUNTER — Telehealth: Payer: Self-pay

## 2022-04-25 ENCOUNTER — Other Ambulatory Visit: Payer: Self-pay | Admitting: Hematology and Oncology

## 2022-04-25 NOTE — Telephone Encounter (Signed)
Spoke to patient, she has appt for IV iron already set up.

## 2022-04-25 NOTE — Telephone Encounter (Signed)
-----   Message from Melodye Ped, NP sent at 04/25/2022  1:16 PM EDT ----- Regarding: RE: labs from 04/23/22 Iron studies are low as suspected. I will arrange for IV iron. She is aware that is a possibility ----- Message ----- From: Georgette Shell, RN Sent: 04/25/2022   1:11 PM EDT To: Melodye Ped, NP Subject: labs from 04/23/22                              Called asking about lab results from 04/23/2022

## 2022-05-01 ENCOUNTER — Encounter: Payer: Self-pay | Admitting: Hematology and Oncology

## 2022-05-01 MED FILL — Iron Sucrose Inj 20 MG/ML (Fe Equiv): INTRAVENOUS | Qty: 10 | Status: AC

## 2022-05-02 ENCOUNTER — Inpatient Hospital Stay: Payer: Medicare PPO

## 2022-05-02 VITALS — BP 159/74 | HR 77 | Temp 98.2°F | Resp 20 | Ht 65.0 in | Wt 248.0 lb

## 2022-05-02 DIAGNOSIS — N189 Chronic kidney disease, unspecified: Secondary | ICD-10-CM

## 2022-05-02 DIAGNOSIS — D649 Anemia, unspecified: Secondary | ICD-10-CM | POA: Diagnosis not present

## 2022-05-02 MED ORDER — SODIUM CHLORIDE 0.9 % IV SOLN
200.0000 mg | Freq: Once | INTRAVENOUS | Status: AC
  Administered 2022-05-02: 200 mg via INTRAVENOUS
  Filled 2022-05-02: qty 10

## 2022-05-02 MED ORDER — SODIUM CHLORIDE 0.9 % IV SOLN: Freq: Once | INTRAVENOUS | Status: AC

## 2022-05-02 MED FILL — Iron Sucrose Inj 20 MG/ML (Fe Equiv): INTRAVENOUS | Qty: 10 | Status: AC

## 2022-05-02 NOTE — Patient Instructions (Signed)

## 2022-05-03 ENCOUNTER — Inpatient Hospital Stay: Payer: Medicare PPO

## 2022-05-03 VITALS — BP 135/78 | HR 63 | Resp 20

## 2022-05-03 DIAGNOSIS — D649 Anemia, unspecified: Secondary | ICD-10-CM | POA: Diagnosis not present

## 2022-05-03 DIAGNOSIS — N189 Chronic kidney disease, unspecified: Secondary | ICD-10-CM

## 2022-05-03 MED ORDER — SODIUM CHLORIDE 0.9 % IV SOLN: Freq: Once | INTRAVENOUS | Status: AC

## 2022-05-03 MED ORDER — SODIUM CHLORIDE 0.9 % IV SOLN
200.0000 mg | Freq: Once | INTRAVENOUS | Status: AC
  Administered 2022-05-03: 200 mg via INTRAVENOUS
  Filled 2022-05-03: qty 200

## 2022-05-03 MED FILL — Iron Sucrose Inj 20 MG/ML (Fe Equiv): INTRAVENOUS | Qty: 10 | Status: AC

## 2022-05-06 ENCOUNTER — Inpatient Hospital Stay: Payer: Medicare PPO | Attending: Hematology and Oncology

## 2022-05-06 VITALS — BP 172/75 | HR 81 | Temp 97.7°F | Resp 20 | Wt 242.0 lb

## 2022-05-06 DIAGNOSIS — N189 Chronic kidney disease, unspecified: Secondary | ICD-10-CM

## 2022-05-06 DIAGNOSIS — D649 Anemia, unspecified: Secondary | ICD-10-CM | POA: Diagnosis not present

## 2022-05-06 MED ORDER — SODIUM CHLORIDE 0.9 % IV SOLN: Freq: Once | INTRAVENOUS | Status: AC

## 2022-05-06 MED ORDER — SODIUM CHLORIDE 0.9 % IV SOLN
200.0000 mg | Freq: Once | INTRAVENOUS | Status: AC
  Administered 2022-05-06: 200 mg via INTRAVENOUS
  Filled 2022-05-06: qty 200

## 2022-05-06 NOTE — Patient Instructions (Signed)

## 2022-05-08 ENCOUNTER — Ambulatory Visit (INDEPENDENT_AMBULATORY_CARE_PROVIDER_SITE_OTHER): Payer: Medicare PPO

## 2022-05-08 DIAGNOSIS — I495 Sick sinus syndrome: Secondary | ICD-10-CM | POA: Diagnosis not present

## 2022-05-08 MED FILL — Iron Sucrose Inj 20 MG/ML (Fe Equiv): INTRAVENOUS | Qty: 10 | Status: AC

## 2022-05-09 ENCOUNTER — Inpatient Hospital Stay: Payer: Medicare PPO

## 2022-05-09 VITALS — BP 140/57 | HR 60 | Temp 97.8°F | Resp 18 | Ht 65.0 in | Wt 240.2 lb

## 2022-05-09 DIAGNOSIS — D649 Anemia, unspecified: Secondary | ICD-10-CM | POA: Diagnosis not present

## 2022-05-09 DIAGNOSIS — N189 Chronic kidney disease, unspecified: Secondary | ICD-10-CM

## 2022-05-09 MED ORDER — SODIUM CHLORIDE 0.9 % IV SOLN: Freq: Once | INTRAVENOUS | Status: AC

## 2022-05-09 MED ORDER — SODIUM CHLORIDE 0.9 % IV SOLN
200.0000 mg | Freq: Once | INTRAVENOUS | Status: AC
  Administered 2022-05-09: 200 mg via INTRAVENOUS
  Filled 2022-05-09: qty 200

## 2022-05-09 MED FILL — Iron Sucrose Inj 20 MG/ML (Fe Equiv): INTRAVENOUS | Qty: 10 | Status: AC

## 2022-05-09 NOTE — Progress Notes (Signed)
Pt d/c home with saline lock. Instructed not to get dressing wet. Pt instructed that if iv comes out she can place pressure over site with gauze or paper towel until bleeding stops and then place bandaid over site.

## 2022-05-09 NOTE — Patient Instructions (Signed)

## 2022-05-10 ENCOUNTER — Inpatient Hospital Stay: Payer: Medicare PPO

## 2022-05-10 VITALS — BP 120/50 | HR 68 | Temp 97.9°F | Resp 18 | Ht 65.0 in | Wt 241.1 lb

## 2022-05-10 DIAGNOSIS — D649 Anemia, unspecified: Secondary | ICD-10-CM | POA: Diagnosis not present

## 2022-05-10 DIAGNOSIS — N189 Chronic kidney disease, unspecified: Secondary | ICD-10-CM

## 2022-05-10 MED ORDER — SODIUM CHLORIDE 0.9 % IV SOLN: Freq: Once | INTRAVENOUS | Status: AC

## 2022-05-10 MED ORDER — SODIUM CHLORIDE 0.9 % IV SOLN
200.0000 mg | Freq: Once | INTRAVENOUS | Status: AC
  Administered 2022-05-10: 200 mg via INTRAVENOUS
  Filled 2022-05-10: qty 200

## 2022-05-10 NOTE — Patient Instructions (Signed)

## 2022-05-11 LAB — CUP PACEART REMOTE DEVICE CHECK
Battery Remaining Longevity: 95 mo
Battery Voltage: 2.99 V
Brady Statistic AP VP Percent: 98.94 %
Brady Statistic AP VS Percent: 0.02 %
Brady Statistic AS VP Percent: 1 %
Brady Statistic AS VS Percent: 0.1 %
Brady Statistic RA Percent Paced: 95.72 %
Brady Statistic RV Percent Paced: 99.82 %
Date Time Interrogation Session: 20230704205226
Implantable Lead Implant Date: 20201006
Implantable Lead Implant Date: 20201006
Implantable Lead Location: 753859
Implantable Lead Location: 753860
Implantable Lead Model: 5076
Implantable Lead Model: 5076
Implantable Pulse Generator Implant Date: 20201006
Lead Channel Impedance Value: 285 Ohm
Lead Channel Impedance Value: 361 Ohm
Lead Channel Impedance Value: 380 Ohm
Lead Channel Impedance Value: 418 Ohm
Lead Channel Pacing Threshold Amplitude: 0.625 V
Lead Channel Pacing Threshold Amplitude: 0.625 V
Lead Channel Pacing Threshold Pulse Width: 0.4 ms
Lead Channel Pacing Threshold Pulse Width: 0.4 ms
Lead Channel Sensing Intrinsic Amplitude: 11 mV
Lead Channel Sensing Intrinsic Amplitude: 11 mV
Lead Channel Sensing Intrinsic Amplitude: 2.25 mV
Lead Channel Sensing Intrinsic Amplitude: 2.25 mV
Lead Channel Setting Pacing Amplitude: 1.5 V
Lead Channel Setting Pacing Amplitude: 2.5 V
Lead Channel Setting Pacing Pulse Width: 0.4 ms
Lead Channel Setting Sensing Sensitivity: 1.2 mV

## 2022-05-29 NOTE — Progress Notes (Signed)
Remote pacemaker transmission.   

## 2022-05-31 ENCOUNTER — Other Ambulatory Visit: Payer: Self-pay

## 2022-06-05 ENCOUNTER — Encounter: Payer: Self-pay | Admitting: Hematology and Oncology

## 2022-06-05 ENCOUNTER — Telehealth: Payer: Self-pay

## 2022-06-05 ENCOUNTER — Inpatient Hospital Stay: Payer: Medicare PPO

## 2022-06-05 ENCOUNTER — Inpatient Hospital Stay: Payer: Medicare PPO | Attending: Hematology and Oncology | Admitting: Hematology and Oncology

## 2022-06-05 ENCOUNTER — Other Ambulatory Visit: Payer: Self-pay | Admitting: Hematology and Oncology

## 2022-06-05 DIAGNOSIS — N189 Chronic kidney disease, unspecified: Secondary | ICD-10-CM

## 2022-06-05 DIAGNOSIS — D649 Anemia, unspecified: Secondary | ICD-10-CM | POA: Insufficient documentation

## 2022-06-05 DIAGNOSIS — D631 Anemia in chronic kidney disease: Secondary | ICD-10-CM

## 2022-06-05 LAB — CBC AND DIFFERENTIAL
HCT: 33 — AB (ref 36–46)
Hemoglobin: 10.8 — AB (ref 12.0–16.0)
Neutrophils Absolute: 4.29
Platelets: 179 10*3/uL (ref 150–400)
WBC: 6.4

## 2022-06-05 LAB — IRON AND TIBC
Iron: 86 ug/dL (ref 28–170)
Saturation Ratios: 25 % (ref 10.4–31.8)
TIBC: 341 ug/dL (ref 250–450)
UIBC: 255 ug/dL

## 2022-06-05 LAB — FERRITIN: Ferritin: 196 ng/mL (ref 11–307)

## 2022-06-05 LAB — CBC: RBC: 3.69 — AB (ref 3.87–5.11)

## 2022-06-05 NOTE — Telephone Encounter (Signed)
Patient notified

## 2022-06-05 NOTE — Assessment & Plan Note (Signed)
75 y.o. 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 She was treated with IV Venofer for iron deficiency and returns today for repeat evaluation. Hemoglobin remains low at 10.8; however, iron studies have improved and she reports feeling much less symptomatic. We will repeat evaluation in 3 months.

## 2022-06-05 NOTE — Progress Notes (Cosign Needed)
Patient Care Team: Carmen Ford as PCP - General Constance Haw, MD as PCP - Electrophysiology (Cardiology)  Clinic Day:  06/05/2022  Referring physician: Dellia Beckwith, *  ASSESSMENT & PLAN:   Assessment & Plan: Anemia 76 y.o. 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 She was treated with IV Venofer for iron deficiency and returns today for repeat evaluation. Hemoglobin remains low at 10.8; however, iron studies have improved and she reports feeling much less symptomatic. We will repeat evaluation in 3 months.     The patient understands the plans discussed today and is in agreement with them.  She knows to contact our office if she develops concerns prior to her next appointment.    Melodye Ped, NP  [facility] [address] [place] [address] Dept: [phone] Dept Fax: [phone]   Orders Placed This Encounter  Procedures   CBC and differential    This external order was created through the Results Console.   CBC    This external order was created through the Results Console.      CHIEF COMPLAINT:  CC: A 76 year old female with history of iron deficiency anemia here for 6 week evaluation  Current Treatment:  Surveillance  INTERVAL HISTORY:  Joellyn is here today for repeat clinical assessment. She denies fevers or chills. She denies pain. Her appetite is good. Her weight has been stable.  I have reviewed the past medical history, past surgical history, social history and family history with the patient and they are unchanged from previous note.  ALLERGIES:  is allergic to penicillamine, penicillins, and statins.  MEDICATIONS:  Current Outpatient Medications  Medication Sig Dispense Refill   acetaminophen (TYLENOL) 325 MG tablet Take 1-2 tablets (325-650 mg total) by mouth every 4 (four) hours as needed for mild pain.     amLODipine  (NORVASC) 10 MG tablet Take 1 tablet by mouth daily.     apixaban (ELIQUIS) 5 MG TABS tablet Take 1 tablet by mouth 2 (two) times daily.     carvedilol (COREG) 25 MG tablet Take 0.5 tablets (12.5 mg total) by mouth 2 (two) times daily with a meal. 90 tablet 3   Cholecalciferol (VITAMIN D-1000 MAX ST) 25 MCG (1000 UT) tablet Take by mouth.     furosemide (LASIX) 20 MG tablet Take 20 mg by mouth daily as needed for edema.      gabapentin (NEURONTIN) 400 MG capsule Take by mouth.     Glucagon 1 MG/0.2ML SOLN Glucagon Emergency Kit 1 mg solution for injection     insulin aspart (NOVOLOG FLEXPEN) 100 UNIT/ML FlexPen 3 (three) times daily.     insulin glargine (LANTUS) 100 UNIT/ML injection Inject 9 Units into the skin 2 (two) times daily.      insulin lispro (HUMALOG KWIKPEN) 100 UNIT/ML KwikPen INJECT 5 UNITS SUBCUTANEOUSLY WITH SMALL MEAL INJECT 7 UNITS WITH MEDIUM MEAL AND 9 UNITS WITH LARGE MEAL PLUS CS 130 25. MDD 60 UNITS     losartan (COZAAR) 50 MG tablet Take 1 tablet by mouth daily.     omega-3 acid ethyl esters (LOVAZA) 1 G capsule Take 2 g by mouth 2 (two) times daily.      rOPINIRole (REQUIP) 2 MG tablet Take 2 mg by mouth at bedtime.      No current facility-administered medications for this visit.    HISTORY OF PRESENT ILLNESS:   Oncology  History   No history exists.      REVIEW OF SYSTEMS:   Constitutional: Denies fevers, chills or abnormal weight loss Eyes: Denies blurriness of vision Ears, nose, mouth, throat, and face: Denies mucositis or sore throat Respiratory: Denies cough, dyspnea or wheezes Cardiovascular: Denies palpitation, chest discomfort or lower extremity swelling Gastrointestinal:  Denies nausea, heartburn or change in bowel habits Skin: Denies abnormal skin rashes Lymphatics: Denies new lymphadenopathy or easy bruising Neurological:Denies numbness, tingling or new weaknesses Behavioral/Psych: Mood is stable, no new changes  All other systems were reviewed  with the patient and are negative.   VITALS:  Blood pressure (!) 169/72, pulse 67, temperature (!) 97.5 F (36.4 C), temperature source Oral, resp. rate 18, height _0  (1.651 m), weight 240 lb 8 oz (109.1 kg), SpO2 97 %.  Wt Readings from Last 3 Encounters:  06/05/22 240 lb 8 oz (109.1 kg)  05/10/22 241 lb 1.3 oz (109.4 kg)  05/09/22 240 lb 4 oz (109 kg)    Body mass index is 40.02 kg/m.  Performance status (ECOG): 1 - Symptomatic but completely ambulatory  PHYSICAL EXAM:   GENERAL:alert, no distress and comfortable SKIN: skin color, texture, turgor are normal, no rashes or significant lesions EYES: normal, Conjunctiva are pink and non-injected, sclera clear OROPHARYNX:no exudate, no erythema and lips, buccal mucosa, and tongue normal  NECK: supple, thyroid normal size, non-tender, without nodularity LYMPH:  no palpable lymphadenopathy in the cervical, axillary or inguinal LUNGS: clear to auscultation and percussion with normal breathing effort HEART: regular rate & rhythm and no murmurs and no lower extremity edema ABDOMEN:abdomen soft, non-tender and normal bowel sounds Musculoskeletal:no cyanosis of digits and no clubbing  NEURO: alert & oriented x 3 with fluent speech, no focal motor/sensory deficits  LABORATORY DATA:  I have reviewed the data as listed    Component Value Date/Time   NA 135 (A) 04/23/2022 0000   K 5.1 04/23/2022 0000   CL 99 04/23/2022 0000   CO2 25 (A) 04/23/2022 0000   GLUCOSE 111 (H) 04/17/2022 0835   GLUCOSE 110 (H) 07/03/2017 1220   BUN 23 (A) 04/23/2022 0000   CREATININE 1.4 (A) 04/23/2022 0000   CREATININE 1.52 (H) 04/17/2022 0835   CALCIUM 8.2 (A) 04/23/2022 0000   PROT 6.2 04/17/2022 0835   ALBUMIN 3.8 04/23/2022 0000   ALBUMIN 3.6 (L) 04/17/2022 0835   AST 23 04/23/2022 0000   ALT 16 04/23/2022 0000   ALKPHOS 54 04/23/2022 0000   BILITOT 0.4 04/17/2022 0835   GFRNONAA 34 (L) 02/29/2020 1153   GFRAA 39 (L) 02/29/2020 1153    No  results found for: "SPEP", "UPEP"  Lab Results  Component Value Date   WBC 6.4 06/05/2022   NEUTROABS 4.29 06/05/2022   HGB 10.8 (A) 06/05/2022   HCT 33 (A) 06/05/2022   MCV 86 04/17/2022   PLT 179 06/05/2022      Chemistry      Component Value Date/Time   NA 135 (A) 04/23/2022 0000   K 5.1 04/23/2022 0000   CL 99 04/23/2022 0000   CO2 25 (A) 04/23/2022 0000   BUN 23 (A) 04/23/2022 0000   CREATININE 1.4 (A) 04/23/2022 0000   CREATININE 1.52 (H) 04/17/2022 0835   GLU 89 04/23/2022 0000      Component Value Date/Time   CALCIUM 8.2 (A) 04/23/2022 0000   ALKPHOS 54 04/23/2022 0000   AST 23 04/23/2022 0000   ALT 16 04/23/2022 0000   BILITOT 0.4 04/17/2022 0835  RADIOGRAPHIC STUDIES: I have personally reviewed the radiological images as listed and agreed with the findings in the report. CUP PACEART REMOTE DEVICE CHECK  Result Date: 05/11/2022 Scheduled remote reviewed. Normal device function.  3.4% AF burden, on Eliquis Atrial therapies on, effective 5% of the time Next remote 91 days.

## 2022-06-05 NOTE — Telephone Encounter (Signed)
-----   Message from Melodye Ped, NP sent at 06/05/2022 12:56 PM EDT ----- Can you let her know her iron studies are better and we will repeat labs in 3 months?  Thank you, Lenna Sciara

## 2022-07-25 ENCOUNTER — Other Ambulatory Visit: Payer: Self-pay | Admitting: Cardiology

## 2022-08-07 ENCOUNTER — Ambulatory Visit (INDEPENDENT_AMBULATORY_CARE_PROVIDER_SITE_OTHER): Payer: Medicare PPO

## 2022-08-07 DIAGNOSIS — I495 Sick sinus syndrome: Secondary | ICD-10-CM

## 2022-08-07 LAB — CUP PACEART REMOTE DEVICE CHECK
Battery Remaining Longevity: 91 mo
Battery Voltage: 2.98 V
Brady Statistic AP VP Percent: 96.38 %
Brady Statistic AP VS Percent: 0.02 %
Brady Statistic AS VP Percent: 3.6 %
Brady Statistic AS VS Percent: 0.1 %
Brady Statistic RA Percent Paced: 92.29 %
Brady Statistic RV Percent Paced: 99.68 %
Date Time Interrogation Session: 20231003214603
Implantable Lead Implant Date: 20201006
Implantable Lead Implant Date: 20201006
Implantable Lead Location: 753859
Implantable Lead Location: 753860
Implantable Lead Model: 5076
Implantable Lead Model: 5076
Implantable Pulse Generator Implant Date: 20201006
Lead Channel Impedance Value: 285 Ohm
Lead Channel Impedance Value: 361 Ohm
Lead Channel Impedance Value: 361 Ohm
Lead Channel Impedance Value: 418 Ohm
Lead Channel Pacing Threshold Amplitude: 0.5 V
Lead Channel Pacing Threshold Amplitude: 0.625 V
Lead Channel Pacing Threshold Pulse Width: 0.4 ms
Lead Channel Pacing Threshold Pulse Width: 0.4 ms
Lead Channel Sensing Intrinsic Amplitude: 2.625 mV
Lead Channel Sensing Intrinsic Amplitude: 2.625 mV
Lead Channel Sensing Intrinsic Amplitude: 9.75 mV
Lead Channel Sensing Intrinsic Amplitude: 9.75 mV
Lead Channel Setting Pacing Amplitude: 1.5 V
Lead Channel Setting Pacing Amplitude: 2.5 V
Lead Channel Setting Pacing Pulse Width: 0.4 ms
Lead Channel Setting Sensing Sensitivity: 1.2 mV

## 2022-08-16 NOTE — Progress Notes (Signed)
Remote pacemaker transmission.   

## 2022-09-04 ENCOUNTER — Other Ambulatory Visit: Payer: Self-pay

## 2022-09-05 ENCOUNTER — Inpatient Hospital Stay: Payer: Medicare PPO | Admitting: Hematology and Oncology

## 2022-09-05 ENCOUNTER — Inpatient Hospital Stay: Payer: Medicare PPO | Attending: Hematology and Oncology

## 2022-09-05 ENCOUNTER — Ambulatory Visit: Payer: Medicare PPO | Admitting: Hematology and Oncology

## 2022-09-05 ENCOUNTER — Encounter: Payer: Self-pay | Admitting: Hematology and Oncology

## 2022-09-05 VITALS — BP 168/74 | HR 67 | Temp 98.2°F | Resp 18 | Ht 65.0 in | Wt 237.8 lb

## 2022-09-05 DIAGNOSIS — E538 Deficiency of other specified B group vitamins: Secondary | ICD-10-CM | POA: Diagnosis present

## 2022-09-05 DIAGNOSIS — D649 Anemia, unspecified: Secondary | ICD-10-CM | POA: Diagnosis present

## 2022-09-05 DIAGNOSIS — D509 Iron deficiency anemia, unspecified: Secondary | ICD-10-CM

## 2022-09-05 LAB — CBC AND DIFFERENTIAL
HCT: 34 — AB (ref 36–46)
HCT: 34 — AB (ref 36–46)
Hemoglobin: 11.6 — AB (ref 12.0–16.0)
Hemoglobin: 11.6 — AB (ref 12.0–16.0)
MCV: 88 (ref 81–99)
Neutrophils Absolute: 4.9
Neutrophils Absolute: 4.9
Platelets: 220 10*3/uL (ref 150–400)
Platelets: 220 10*3/uL (ref 150–400)
WBC: 7.1
WBC: 7.1

## 2022-09-05 LAB — FERRITIN: Ferritin: 141 ng/mL (ref 11–307)

## 2022-09-05 LAB — LACTATE DEHYDROGENASE: LDH: 172 U/L (ref 98–192)

## 2022-09-05 LAB — IRON AND TIBC
Iron: 61 ug/dL (ref 28–170)
Saturation Ratios: 18 % (ref 10.4–31.8)
TIBC: 339 ug/dL (ref 250–450)
UIBC: 278 ug/dL

## 2022-09-05 LAB — BASIC METABOLIC PANEL
BUN: 27 — AB (ref 4–21)
CO2: 26 — AB (ref 13–22)
Chloride: 107 (ref 99–108)
Creatinine: 1.6 — AB (ref 0.5–1.1)
Glucose: 109
Potassium: 5.1 mEq/L (ref 3.5–5.1)
Sodium: 140 (ref 137–147)

## 2022-09-05 LAB — HEPATIC FUNCTION PANEL
ALT: 15 U/L (ref 7–35)
AST: 22 (ref 13–35)
Alkaline Phosphatase: 70 (ref 25–125)
Bilirubin, Total: 0.4

## 2022-09-05 LAB — COMPREHENSIVE METABOLIC PANEL
Albumin: 0.4 — AB (ref 3.5–5.0)
Calcium: 8.6 — AB (ref 8.7–10.7)

## 2022-09-05 LAB — TSH: TSH: 2.265 u[IU]/mL (ref 0.350–4.500)

## 2022-09-05 LAB — CBC
RBC: 3.88 (ref 3.87–5.11)
RBC: 3.88 (ref 3.87–5.11)

## 2022-09-05 LAB — FOLATE: Folate: 7.2 ng/mL (ref >5.9)

## 2022-09-05 LAB — VITAMIN B12: Vitamin B-12: 156 pg/mL — ABNORMAL LOW (ref 180–914)

## 2022-09-05 NOTE — Progress Notes (Cosign Needed)
[facility]  [place],  [place]  [address] [phone]    Addendum:  Her B12 was low at 156, so I will get her started on B12 injections daily for 7 days, then weekly for 4 weeks, then every 4 weeks indefinitely.  Iron studies, folate, TSH, and LDH were normal.  Clinic Day:  09/05/2022  Referring physician: Dellia Beckwith, *   HISTORY OF PRESENT ILLNESS:  The patient is a 76 y.o. female with anemia felt to be due to chronic kidney disease.  She was given IV Venofer in June/July to fortify her iron stores. Her hemoglobin improved to 10.8 in August and she was not very symptomatic, so she was placed on observation.  She is her for repeat clinical assessment. She and reports persistent fatigue. She denies any overt form of blood loss.  She states she does not get mammograms.  She states she is up to date on colonoscopy, but review of her records reveals colonoscopy was done by Dr. Lyda Jester in February 2014 and she had removal of 4 small polyps. Pathology revealed tubular adenomas, so she would have been due for repeat colonoscopy in 2019.  REVIEW OF SYSTEMS:  Review of Systems  Constitutional:  Positive for fatigue. Negative for appetite change, chills, fever and unexpected weight change.  HENT:   Negative for lump/mass, mouth sores and sore throat.   Respiratory:  Negative for cough and shortness of breath.   Cardiovascular:  Negative for chest pain and leg swelling.  Gastrointestinal:  Negative for abdominal pain, constipation, diarrhea, nausea and vomiting.  Endocrine: Negative for hot flashes.  Genitourinary:  Negative for difficulty urinating, dysuria, frequency and hematuria.   Musculoskeletal:  Positive for gait problem (Ambulates with a cane for stability). Negative for arthralgias, back pain and myalgias.  Skin:  Negative for rash.  Neurological:  Positive for gait problem (Ambulates with a cane for stability). Negative for  dizziness and headaches.  Hematological:  Negative for adenopathy. Does not bruise/bleed easily.  Psychiatric/Behavioral:  Negative for depression and sleep disturbance. The patient is not nervous/anxious.      PHYSICAL EXAM:  Blood pressure (!) 168/74, pulse 67, temperature 98.2 F (36.8 C), temperature source Oral, resp. rate 18, height 5\' 5"  (1.651 m), weight 237 lb 12.8 oz (107.9 kg), SpO2 92 %. Wt Readings from Last 3 Encounters:  09/05/22 237 lb 12.8 oz (107.9 kg)  06/05/22 240 lb 8 oz (109.1 kg)  05/10/22 241 lb 1.3 oz (109.4 kg)   Body mass index is 39.57 kg/m.  Performance status (ECOG): 1 - Symptomatic but completely ambulatory  Physical Exam Vitals and nursing note reviewed.  Constitutional:      General: She is not in acute distress.    Appearance: Normal appearance.  HENT:     Head: Normocephalic and atraumatic.     Mouth/Throat:     Mouth: Mucous membranes are moist.     Pharynx: Oropharynx is clear. No oropharyngeal exudate or posterior oropharyngeal erythema.  Eyes:     General: No scleral icterus.    Extraocular Movements: Extraocular movements intact.     Conjunctiva/sclera: Conjunctivae normal.     Pupils: Pupils are equal, round, and reactive to light.  Cardiovascular:     Rate and Rhythm: Normal rate and regular rhythm.     Heart sounds: Normal heart sounds. No murmur heard.    No friction rub. No gallop.  Pulmonary:     Effort: Pulmonary effort is normal.  Breath sounds: Normal breath sounds. No wheezing, rhonchi or rales.  Abdominal:     General: There is no distension.     Palpations: Abdomen is soft. There is no hepatomegaly, splenomegaly or mass.     Tenderness: There is no abdominal tenderness.  Musculoskeletal:        General: Normal range of motion.     Cervical back: Normal range of motion and neck supple. No tenderness.     Right lower leg: No edema.     Left lower leg: No edema.  Lymphadenopathy:     Cervical: No cervical  adenopathy.     Upper Body:     Right upper body: No supraclavicular or axillary adenopathy.     Left upper body: No supraclavicular or axillary adenopathy.     Lower Body: No right inguinal adenopathy. No left inguinal adenopathy.  Skin:    General: Skin is warm and dry.     Coloration: Skin is not jaundiced.     Findings: No rash.  Neurological:     Mental Status: She is alert and oriented to person, place, and time.     Cranial Nerves: No cranial nerve deficit.  Psychiatric:        Mood and Affect: Mood normal.        Behavior: Behavior normal.        Thought Content: Thought content normal.     LABS:      Latest Ref Rng & Units 09/05/2022   12:00 AM 06/05/2022   12:00 AM 04/23/2022   12:00 AM  CBC  WBC  7.1       7.1     6.4     6.9      Hemoglobin 12.0 - 16.0 12.0 - 16.0 11.6       11.6     10.8     10.5      Hematocrit 36 - 46 36 - 46 34       34     33     31      Platelets 150 - 400 K/uL 150 - 400 K/uL 220       220     179     259         This result is from an external source.   Multiple values from one day are sorted in reverse-chronological order      Latest Ref Rng & Units 09/05/2022   12:00 AM 04/23/2022   12:00 AM 04/17/2022    8:35 AM  CMP  Glucose 70 - 99 mg/dL   111   BUN 4 - 21 27     23     22    Creatinine 0.5 - 1.1 1.6     1.4     1.52   Sodium 137 - 147 140     135     134   Potassium 3.5 - 5.1 mEq/L 5.1     5.1     5.7   Chloride 99 - 108 107     99     98   CO2 13 - 22 26     25     24    Calcium 8.7 - 10.7 8.6     8.2     8.4   Total Protein 6.0 - 8.5 g/dL   6.2   Total Bilirubin 0.0 - 1.2 mg/dL   0.4   Alkaline Phos 25 - 125 70  54     54   AST 13 - 35 22     23     16    ALT 7 - 35 U/L 15     16     10       This result is from an external source.     No results found for: "CEA1", "CEA" / No results found for: "CEA1", "CEA" No results found for: "PSA1" No results found for: "EQA834" No results found for: "CAN125"  No results  found for: "TOTALPROTELP", "ALBUMINELP", "A1GS", "A2GS", "BETS", "BETA2SER", "GAMS", "MSPIKE", "SPEI" Lab Results  Component Value Date   TIBC 341 06/05/2022   TIBC 371 04/23/2022   FERRITIN 196 06/05/2022   FERRITIN 46 04/23/2022   IRONPCTSAT 25 06/05/2022   IRONPCTSAT 23 04/23/2022   No results found for: "LDH"  No results found for: "AFPTUMOR", "TOTALPROTELP", "ALBUMINELP", "A1GS", "A2GS", "BETS", "BETA2SER", "GAMS", "MSPIKE", "SPEI", "LDH", "CEA1", "CEA", "PSA1", "IGASERUM", "IGGSERUM", "IGMSERUM", "THGAB", "THYROGLB"  Review Flowsheet       Latest Ref Rng & Units 04/23/2022 06/05/2022  Oncology Labs  Ferritin 11 - 307 ng/mL 46  196   %SAT 10.4 - 31.8 % 23  25      STUDIES:  CUP PACEART REMOTE DEVICE CHECK  Result Date: 08/07/2022 Scheduled remote reviewed. Normal device function.  Hx of PAF, 7 of 41 episodes pace terminated (14.9%) up to 98 bursts of ATP Burden 4.3%, Eliquis Next remote 91 days. LA     ASSESSMENT & PLAN:   Assessment/Plan:  76 y.o. female with anemia felt to be due to chronic kidney disease. Her hemoglobin has improved further.  Iron studies are pending from today.  Full evaluation was not previously done, so B12, folate, TSH, LDH and SPEP will be done today.  I will plan to see her back in 3 months for repeat clinical assessment  The patient understands all the plans discussed today and is in agreement with them.  She knows to contact our office if she develops symptoms of worsening anemia or other concerns prior to her next appointment    Marvia Pickles, PA-C

## 2022-09-06 ENCOUNTER — Encounter: Payer: Self-pay | Admitting: Hematology and Oncology

## 2022-09-06 ENCOUNTER — Other Ambulatory Visit: Payer: Self-pay | Admitting: Hematology and Oncology

## 2022-09-06 DIAGNOSIS — E538 Deficiency of other specified B group vitamins: Secondary | ICD-10-CM

## 2022-09-06 HISTORY — DX: Deficiency of other specified B group vitamins: E53.8

## 2022-09-10 ENCOUNTER — Inpatient Hospital Stay: Payer: Medicare PPO

## 2022-09-10 VITALS — BP 131/56 | HR 70 | Temp 97.6°F | Resp 20 | Ht 65.0 in | Wt 239.2 lb

## 2022-09-10 DIAGNOSIS — E538 Deficiency of other specified B group vitamins: Secondary | ICD-10-CM | POA: Diagnosis not present

## 2022-09-10 LAB — PROTEIN ELECTROPHORESIS, SERUM
A/G Ratio: 1.1 (ref 0.7–1.7)
Albumin ELP: 3.4 g/dL (ref 2.9–4.4)
Alpha-1-Globulin: 0.3 g/dL (ref 0.0–0.4)
Alpha-2-Globulin: 0.8 g/dL (ref 0.4–1.0)
Beta Globulin: 1.2 g/dL (ref 0.7–1.3)
Gamma Globulin: 1 g/dL (ref 0.4–1.8)
Globulin, Total: 3.2 g/dL (ref 2.2–3.9)
Total Protein ELP: 6.6 g/dL (ref 6.0–8.5)

## 2022-09-10 MED ORDER — CYANOCOBALAMIN 1000 MCG/ML IJ SOLN
1000.0000 ug | Freq: Once | INTRAMUSCULAR | Status: AC
  Administered 2022-09-10: 1000 ug via INTRAMUSCULAR
  Filled 2022-09-10: qty 1

## 2022-09-10 NOTE — Patient Instructions (Signed)

## 2022-09-11 ENCOUNTER — Inpatient Hospital Stay: Payer: Medicare PPO

## 2022-09-11 VITALS — BP 127/50 | HR 69 | Temp 98.0°F | Resp 18 | Ht 65.0 in | Wt 239.2 lb

## 2022-09-11 DIAGNOSIS — E538 Deficiency of other specified B group vitamins: Secondary | ICD-10-CM | POA: Diagnosis not present

## 2022-09-11 MED ORDER — CYANOCOBALAMIN 1000 MCG/ML IJ SOLN
1000.0000 ug | Freq: Once | INTRAMUSCULAR | Status: AC
  Administered 2022-09-11: 1000 ug via INTRAMUSCULAR
  Filled 2022-09-11: qty 1

## 2022-09-11 NOTE — Patient Instructions (Signed)

## 2022-09-12 ENCOUNTER — Inpatient Hospital Stay: Payer: Medicare PPO

## 2022-09-12 VITALS — BP 134/48 | HR 62 | Temp 97.7°F | Resp 18 | Ht 65.0 in | Wt 239.0 lb

## 2022-09-12 DIAGNOSIS — E538 Deficiency of other specified B group vitamins: Secondary | ICD-10-CM

## 2022-09-12 MED ORDER — CYANOCOBALAMIN 1000 MCG/ML IJ SOLN
1000.0000 ug | Freq: Once | INTRAMUSCULAR | Status: AC
  Administered 2022-09-12: 1000 ug via INTRAMUSCULAR
  Filled 2022-09-12: qty 1

## 2022-09-12 NOTE — Patient Instructions (Signed)

## 2022-09-13 ENCOUNTER — Inpatient Hospital Stay: Payer: Medicare PPO

## 2022-09-13 VITALS — BP 116/53 | HR 65 | Temp 97.6°F | Resp 14 | Ht 65.0 in | Wt 240.1 lb

## 2022-09-13 DIAGNOSIS — E538 Deficiency of other specified B group vitamins: Secondary | ICD-10-CM

## 2022-09-13 MED ORDER — CYANOCOBALAMIN 1000 MCG/ML IJ SOLN
1000.0000 ug | Freq: Once | INTRAMUSCULAR | Status: AC
  Administered 2022-09-13: 1000 ug via INTRAMUSCULAR
  Filled 2022-09-13: qty 1

## 2022-09-13 NOTE — Patient Instructions (Signed)

## 2022-09-16 ENCOUNTER — Inpatient Hospital Stay: Payer: Medicare PPO

## 2022-09-16 VITALS — BP 124/63 | HR 68 | Temp 97.8°F | Resp 18 | Ht 65.0 in | Wt 238.0 lb

## 2022-09-16 DIAGNOSIS — E538 Deficiency of other specified B group vitamins: Secondary | ICD-10-CM | POA: Diagnosis not present

## 2022-09-16 MED ORDER — CYANOCOBALAMIN 1000 MCG/ML IJ SOLN
1000.0000 ug | Freq: Once | INTRAMUSCULAR | Status: AC
  Administered 2022-09-16: 1000 ug via INTRAMUSCULAR
  Filled 2022-09-16: qty 1

## 2022-09-16 NOTE — Patient Instructions (Signed)

## 2022-09-17 ENCOUNTER — Inpatient Hospital Stay: Payer: Medicare PPO

## 2022-09-17 VITALS — BP 129/65 | HR 73 | Temp 98.3°F | Resp 20 | Ht 65.0 in

## 2022-09-17 DIAGNOSIS — E538 Deficiency of other specified B group vitamins: Secondary | ICD-10-CM

## 2022-09-17 MED ORDER — CYANOCOBALAMIN 1000 MCG/ML IJ SOLN
1000.0000 ug | Freq: Once | INTRAMUSCULAR | Status: AC
  Administered 2022-09-17: 1000 ug via INTRAMUSCULAR
  Filled 2022-09-17: qty 1

## 2022-09-17 NOTE — Patient Instructions (Signed)

## 2022-09-18 ENCOUNTER — Inpatient Hospital Stay: Payer: Medicare PPO

## 2022-09-18 VITALS — BP 129/65 | HR 96 | Temp 97.5°F | Resp 18 | Ht 65.0 in | Wt 238.0 lb

## 2022-09-18 DIAGNOSIS — E538 Deficiency of other specified B group vitamins: Secondary | ICD-10-CM | POA: Diagnosis not present

## 2022-09-18 MED ORDER — CYANOCOBALAMIN 1000 MCG/ML IJ SOLN
1000.0000 ug | Freq: Once | INTRAMUSCULAR | Status: AC
  Administered 2022-09-18: 1000 ug via INTRAMUSCULAR
  Filled 2022-09-18: qty 1

## 2022-09-18 NOTE — Patient Instructions (Signed)

## 2022-09-25 ENCOUNTER — Inpatient Hospital Stay: Payer: Medicare PPO

## 2022-09-25 VITALS — BP 136/56 | HR 69 | Temp 97.8°F | Resp 18 | Ht 65.0 in | Wt 240.0 lb

## 2022-09-25 DIAGNOSIS — E538 Deficiency of other specified B group vitamins: Secondary | ICD-10-CM

## 2022-09-25 MED ORDER — CYANOCOBALAMIN 1000 MCG/ML IJ SOLN
1000.0000 ug | Freq: Once | INTRAMUSCULAR | Status: AC
  Administered 2022-09-25: 1000 ug via INTRAMUSCULAR
  Filled 2022-09-25: qty 1

## 2022-10-02 ENCOUNTER — Inpatient Hospital Stay: Payer: Medicare PPO

## 2022-10-02 VITALS — BP 133/69 | HR 66 | Temp 97.5°F | Resp 20 | Ht 65.0 in | Wt 245.0 lb

## 2022-10-02 DIAGNOSIS — E538 Deficiency of other specified B group vitamins: Secondary | ICD-10-CM | POA: Diagnosis not present

## 2022-10-02 MED ORDER — CYANOCOBALAMIN 1000 MCG/ML IJ SOLN
1000.0000 ug | Freq: Once | INTRAMUSCULAR | Status: AC
  Administered 2022-10-02: 1000 ug via INTRAMUSCULAR
  Filled 2022-10-02: qty 1

## 2022-10-02 NOTE — Patient Instructions (Signed)

## 2022-10-09 ENCOUNTER — Inpatient Hospital Stay: Payer: Medicare PPO | Attending: Hematology and Oncology

## 2022-10-09 VITALS — BP 131/56 | HR 64 | Temp 97.9°F | Resp 18 | Ht 65.0 in | Wt 240.2 lb

## 2022-10-09 DIAGNOSIS — E538 Deficiency of other specified B group vitamins: Secondary | ICD-10-CM | POA: Diagnosis present

## 2022-10-09 DIAGNOSIS — D649 Anemia, unspecified: Secondary | ICD-10-CM | POA: Insufficient documentation

## 2022-10-09 MED ORDER — CYANOCOBALAMIN 1000 MCG/ML IJ SOLN
1000.0000 ug | Freq: Once | INTRAMUSCULAR | Status: AC
  Administered 2022-10-09: 1000 ug via INTRAMUSCULAR
  Filled 2022-10-09: qty 1

## 2022-10-09 NOTE — Patient Instructions (Signed)

## 2022-10-16 ENCOUNTER — Inpatient Hospital Stay: Payer: Medicare PPO

## 2022-10-16 VITALS — BP 145/57 | HR 74 | Temp 97.6°F | Resp 18 | Ht 65.0 in | Wt 240.0 lb

## 2022-10-16 DIAGNOSIS — E538 Deficiency of other specified B group vitamins: Secondary | ICD-10-CM

## 2022-10-16 MED ORDER — CYANOCOBALAMIN 1000 MCG/ML IJ SOLN
1000.0000 ug | Freq: Once | INTRAMUSCULAR | Status: AC
  Administered 2022-10-16: 1000 ug via INTRAMUSCULAR
  Filled 2022-10-16: qty 1

## 2022-10-16 NOTE — Patient Instructions (Signed)

## 2022-10-25 ENCOUNTER — Ambulatory Visit: Payer: Medicare PPO | Admitting: Cardiology

## 2022-10-25 DIAGNOSIS — N390 Urinary tract infection, site not specified: Secondary | ICD-10-CM | POA: Insufficient documentation

## 2022-10-25 HISTORY — DX: Urinary tract infection, site not specified: N39.0

## 2022-10-26 DIAGNOSIS — R6520 Severe sepsis without septic shock: Secondary | ICD-10-CM | POA: Insufficient documentation

## 2022-10-26 DIAGNOSIS — E871 Hypo-osmolality and hyponatremia: Secondary | ICD-10-CM

## 2022-10-26 DIAGNOSIS — B9561 Methicillin susceptible Staphylococcus aureus infection as the cause of diseases classified elsewhere: Secondary | ICD-10-CM

## 2022-10-26 DIAGNOSIS — R7881 Bacteremia: Secondary | ICD-10-CM | POA: Insufficient documentation

## 2022-10-26 HISTORY — DX: Acute kidney failure, unspecified: R65.20

## 2022-10-26 HISTORY — DX: Methicillin susceptible Staphylococcus aureus infection as the cause of diseases classified elsewhere: B95.61

## 2022-10-26 HISTORY — DX: Hypo-osmolality and hyponatremia: E87.1

## 2022-10-29 ENCOUNTER — Telehealth: Payer: Self-pay

## 2022-10-29 ENCOUNTER — Telehealth: Payer: Self-pay | Admitting: Cardiology

## 2022-10-29 NOTE — Telephone Encounter (Signed)
   Pre-operative Risk Assessment    Patient Name: Carmen Ford  DOB: 1946/06/03 MRN: [id number]      Request for Surgical Clearance    Procedure:   T&L WO/W  Date of Surgery:  Clearance TBD                                 Surgeon:   Surgeon's Group or Practice Name:  [facility] Phone number:  [phone] Fax number:  [phone]   Type of Clearance Requested:   - Medical    Type of Anesthesia:  None    Additional requests/questions:    SignedLowella Grip   10/29/2022, 12:17 PM

## 2022-10-29 NOTE — Telephone Encounter (Signed)
Spoke with Dr. Rockwell Germany.  Patient is needing MRI clearance for potential epidural/spinal abscess.  Obtained clearance fax form (sent for scanning to [employer]) Completed with clearance Returned fax to location: [name] involved and aware should they need anything he can connect them with rep in the [place] area.

## 2022-10-29 NOTE — Telephone Encounter (Signed)
Thank you,  I spoke with the physician at [facility]. Clearance obtained and faxed with confirmation for MRI.

## 2022-10-29 NOTE — Telephone Encounter (Signed)
New Message:     Please call,, she need information about patient's device.

## 2022-10-30 DIAGNOSIS — G9341 Metabolic encephalopathy: Secondary | ICD-10-CM | POA: Insufficient documentation

## 2022-10-30 DIAGNOSIS — K6812 Psoas muscle abscess: Secondary | ICD-10-CM | POA: Insufficient documentation

## 2022-10-30 DIAGNOSIS — G8250 Quadriplegia, unspecified: Secondary | ICD-10-CM | POA: Insufficient documentation

## 2022-10-30 HISTORY — DX: Metabolic encephalopathy: G93.41

## 2022-10-30 HISTORY — DX: Quadriplegia, unspecified: G82.50

## 2022-10-30 HISTORY — DX: Psoas muscle abscess: K68.12

## 2022-11-02 DIAGNOSIS — E43 Unspecified severe protein-calorie malnutrition: Secondary | ICD-10-CM | POA: Insufficient documentation

## 2022-11-02 DIAGNOSIS — J9601 Acute respiratory failure with hypoxia: Secondary | ICD-10-CM

## 2022-11-02 DIAGNOSIS — E8770 Fluid overload, unspecified: Secondary | ICD-10-CM | POA: Insufficient documentation

## 2022-11-02 HISTORY — DX: Unspecified severe protein-calorie malnutrition: E43

## 2022-11-02 HISTORY — DX: Fluid overload, unspecified: E87.70

## 2022-11-02 HISTORY — DX: Acute respiratory failure with hypoxia: J96.01

## 2022-11-13 ENCOUNTER — Ambulatory Visit: Payer: Medicare PPO

## 2022-11-27 ENCOUNTER — Ambulatory Visit: Payer: Medicare PPO | Attending: Cardiology

## 2022-11-27 DIAGNOSIS — I495 Sick sinus syndrome: Secondary | ICD-10-CM

## 2022-11-28 ENCOUNTER — Telehealth: Payer: Self-pay

## 2022-11-28 LAB — CUP PACEART REMOTE DEVICE CHECK
Battery Remaining Longevity: 90 mo
Battery Voltage: 2.98 V
Brady Statistic RA Percent Paced: 0.07 %
Brady Statistic RV Percent Paced: 96.51 %
Date Time Interrogation Session: 20240124193006
Implantable Lead Connection Status: 753985
Implantable Lead Connection Status: 753985
Implantable Lead Implant Date: 20201006
Implantable Lead Implant Date: 20201006
Implantable Lead Location: 753859
Implantable Lead Location: 753860
Implantable Lead Model: 5076
Implantable Lead Model: 5076
Implantable Pulse Generator Implant Date: 20201006
Lead Channel Impedance Value: 304 Ohm
Lead Channel Impedance Value: 380 Ohm
Lead Channel Impedance Value: 399 Ohm
Lead Channel Impedance Value: 437 Ohm
Lead Channel Pacing Threshold Amplitude: 0.5 V
Lead Channel Pacing Threshold Amplitude: 0.625 V
Lead Channel Pacing Threshold Pulse Width: 0.4 ms
Lead Channel Pacing Threshold Pulse Width: 0.4 ms
Lead Channel Sensing Intrinsic Amplitude: 1.875 mV
Lead Channel Sensing Intrinsic Amplitude: 1.875 mV
Lead Channel Sensing Intrinsic Amplitude: 8.875 mV
Lead Channel Sensing Intrinsic Amplitude: 8.875 mV
Lead Channel Setting Pacing Amplitude: 1.5 V
Lead Channel Setting Pacing Amplitude: 2.5 V
Lead Channel Setting Pacing Pulse Width: 0.4 ms
Lead Channel Setting Sensing Sensitivity: 1.2 mV
Zone Setting Status: 755011

## 2022-11-28 NOTE — Telephone Encounter (Signed)
Scheduled remote reviewed. Normal device function.   Ongoing AF since 10/20/2022, on [place], AF burden is 100%, sent to triage due to persistent AF, good ventricular rate control Next remote 91 days. Kathy Breach, RN, CCDS, [employer]  Flagging for AF burden elevation since early December. Good ventricular rate control.  Spoke with patient, she is currently in a rehab facility and unable to walk.  Since early December she has been hospitalized with sepsis, renal and respiratory failure with acute encephalopathy.  She is now at a SNF facility and undergoing PT.  She is on Eliquis. Has hx of persistent AF.   FYI to Dr. Curt Bears if anything further at this time.

## 2022-11-29 NOTE — Telephone Encounter (Signed)
Spoke with patients husband, patient is at [facility] which is [place] of [place], patient relies on SNF transportation to appointments, unlikely SNF will be able to bring patient to [facility], also patient is dialysis MWF. Will forward this information to Dr. Bettina Gavia office as patient has apt scheduled for 12/12/21 in [place]   Please call [facility] to confirm apt on 12/12/22 at [address] Bettina Gavia

## 2022-12-02 NOTE — Telephone Encounter (Signed)
12/12/22 appointment has been confirmed with [place] [facility].

## 2022-12-04 ENCOUNTER — Other Ambulatory Visit: Payer: Self-pay | Admitting: Hematology and Oncology

## 2022-12-04 DIAGNOSIS — N189 Chronic kidney disease, unspecified: Secondary | ICD-10-CM

## 2022-12-04 DIAGNOSIS — D509 Iron deficiency anemia, unspecified: Secondary | ICD-10-CM

## 2022-12-04 DIAGNOSIS — E538 Deficiency of other specified B group vitamins: Secondary | ICD-10-CM

## 2022-12-04 NOTE — Progress Notes (Deleted)
[facility]  [address] [place],  [place]  [address] [phone]     Clinic Day:  12/04/2022  Referring physician: No ref. provider found   HISTORY OF PRESENT ILLNESS:  The patient is a 77 y.o. female with anemia initially felt to be due to chronic kidney disease.  She was found to have iron deficiency and received IV Venofer in June/July to fortify her iron stores. Her hemoglobin improved to 10.8 in August and she was not very symptomatic, so she was placed on observation.  When I saw her in November, she reported persistent fatigue.  Her hemoglobin was up to 11.6, but due to her symptoms, I did a further evaluation.  She was found to have B12 deficiency and placed on B12 injections.  She denies any overt form of blood loss.  She states she does not get mammograms.  She states she is up to date on colonoscopy, but review of her records reveals colonoscopy was done by Dr. Lyda Jester in February 2014 and she had removal of 4 small polyps. Pathology revealed tubular adenomas, so she would have been due for repeat colonoscopy in 2019 at the latest.  REVIEW OF SYSTEMS:  Review of Systems  Constitutional:  Negative for appetite change, chills, fatigue, fever and unexpected weight change.  HENT:   Negative for lump/mass, mouth sores and sore throat.   Respiratory:  Negative for cough and shortness of breath.   Cardiovascular:  Negative for chest pain and leg swelling.  Gastrointestinal:  Negative for abdominal pain, constipation, diarrhea, nausea and vomiting.  Endocrine: Negative for hot flashes.  Genitourinary:  Negative for difficulty urinating, dysuria, frequency and hematuria.   Musculoskeletal:  Negative for arthralgias, back pain and myalgias.  Skin:  Negative for rash.  Neurological:  Negative for dizziness and headaches.  Hematological:  Negative for adenopathy. Does not bruise/bleed easily.  Psychiatric/Behavioral:  Negative for depression and  sleep disturbance. The patient is not nervous/anxious.      PHYSICAL EXAM:  There were no vitals taken for this visit. Wt Readings from Last 3 Encounters:  10/16/22 240 lb (108.9 kg)  10/09/22 240 lb 4 oz (109 kg)  10/02/22 245 lb (111.1 kg)   There is no height or weight on file to calculate BMI.  Performance status (ECOG): 1 - Symptomatic but completely ambulatory  Physical Exam Vitals and nursing note reviewed.  Constitutional:      General: She is not in acute distress.    Appearance: Normal appearance.  HENT:     Head: Normocephalic and atraumatic.     Mouth/Throat:     Mouth: Mucous membranes are moist.     Pharynx: Oropharynx is clear. No oropharyngeal exudate or posterior oropharyngeal erythema.  Eyes:     General: No scleral icterus.    Extraocular Movements: Extraocular movements intact.     Conjunctiva/sclera: Conjunctivae normal.     Pupils: Pupils are equal, round, and reactive to light.  Cardiovascular:     Rate and Rhythm: Normal rate and regular rhythm.     Heart sounds: Normal heart sounds. No murmur heard.    No friction rub. No gallop.  Pulmonary:     Effort: Pulmonary effort is normal.     Breath sounds: Normal breath sounds. No wheezing, rhonchi or rales.  Abdominal:     General: There is no distension.     Palpations: Abdomen is soft. There is no hepatomegaly, splenomegaly or mass.     Tenderness: There  is no abdominal tenderness.  Musculoskeletal:        General: Normal range of motion.     Cervical back: Normal range of motion and neck supple. No tenderness.     Right lower leg: No edema.     Left lower leg: No edema.  Lymphadenopathy:     Cervical: No cervical adenopathy.     Upper Body:     Right upper body: No supraclavicular or axillary adenopathy.     Left upper body: No supraclavicular or axillary adenopathy.     Lower Body: No right inguinal adenopathy. No left inguinal adenopathy.  Skin:    General: Skin is warm and dry.      Coloration: Skin is not jaundiced.     Findings: No rash.  Neurological:     Mental Status: She is alert and oriented to person, place, and time.     Cranial Nerves: No cranial nerve deficit.  Psychiatric:        Mood and Affect: Mood normal.        Behavior: Behavior normal.        Thought Content: Thought content normal.     LABS:      Latest Ref Rng & Units 09/05/2022   12:00 AM 06/05/2022   12:00 AM 04/23/2022   12:00 AM  CBC  WBC  7.1       7.1     6.4     6.9      Hemoglobin 12.0 - 16.0 12.0 - 16.0 11.6       11.6     10.8     10.5      Hematocrit 36 - 46 36 - 46 34       34     33     31      Platelets 150 - 400 K/uL 150 - 400 K/uL 220       220     179     259         This result is from an external source.   Multiple values from one day are sorted in reverse-chronological order       Latest Ref Rng & Units 09/05/2022   12:00 AM 04/23/2022   12:00 AM 04/17/2022    8:35 AM  CMP  Glucose 70 - 99 mg/dL   111   BUN 4 - '21 27     23     22   '$ Creatinine 0.5 - 1.1 1.6     1.4     1.52   Sodium 137 - 147 140     135     134   Potassium 3.5 - 5.1 mEq/L 5.1     5.1     5.7   Chloride 99 - 108 107     99     98   CO2 13 - '22 26     25     24   '$ Calcium 8.7 - 10.7 8.6     8.2     8.4   Total Protein 6.0 - 8.5 g/dL   6.2   Total Bilirubin 0.0 - 1.2 mg/dL   0.4   Alkaline Phos 25 - 125 70     54     54   AST 13 - 35 '22     23     16   '$ ALT 7 - 35 U/L 15     16  10      This result is from an external source.      No results found for: "CEA1", "CEA" / No results found for: "CEA1", "CEA" No results found for: "PSA1" No results found for: "CAN199" No results found for: "CAN125"  Lab Results  Component Value Date   TOTALPROTELP 6.6 09/05/2022   ALBUMINELP 3.4 09/05/2022   A1GS 0.3 09/05/2022   A2GS 0.8 09/05/2022   BETS 1.2 09/05/2022   GAMS 1.0 09/05/2022   MSPIKE Not Observed 09/05/2022   SPEI Comment 09/05/2022   Lab Results  Component Value Date    TIBC 339 09/05/2022   TIBC 341 06/05/2022   TIBC 371 04/23/2022   FERRITIN 141 09/05/2022   FERRITIN 196 06/05/2022   FERRITIN 46 04/23/2022   IRONPCTSAT 18 09/05/2022   IRONPCTSAT 25 06/05/2022   IRONPCTSAT 23 04/23/2022   Lab Results  Component Value Date   LDH 172 09/05/2022       Component Value Date/Time   TOTALPROTELP 6.6 09/05/2022 1304   ALBUMINELP 3.4 09/05/2022 1304   A1GS 0.3 09/05/2022 1304   A2GS 0.8 09/05/2022 1304   BETS 1.2 09/05/2022 1304   GAMS 1.0 09/05/2022 1304   MSPIKE Not Observed 09/05/2022 1304   SPEI Comment 09/05/2022 1304   LDH 172 09/05/2022 0908    Review Flowsheet  More data may exist      Latest Ref Rng & Units 04/23/2022 06/05/2022 09/05/2022  Oncology Labs  Ferritin 11 - 307 ng/mL 46  196  141   %SAT 10.4 - 31.8 % '23  25  18   '$ Total Protein ELP 6.0 - 8.5 g/dL - - 6.6   Albumin ELP 2.9 - 4.4 g/dL - - 3.4   Alpha-1 Globulin 0.0 - 0.4 g/dL - - 0.3   Alpha-2 Globulin 0.4 - 1.0 g/dL - - 0.8   Beta Globulin 0.7 - 1.3 g/dL - - 1.2   Gamma Globulin 0.4 - 1.8 g/dL - - 1.0   M-Spike, % Not Observed g/dL - - Not Observed   SPE Interp. - - - Comment   LDH 98 - 192 U/L - - 172     STUDIES:  CUP PACEART REMOTE DEVICE CHECK  Result Date: 11/28/2022 Scheduled remote reviewed. Normal device function.  Ongoing AF since 10/20/2022, on [place], AF burden is 100%, sent to triage due to persistent AF, good ventricular rate control Next remote 91 days. Kathy Breach, RN, CCDS, [employer]     ASSESSMENT & PLAN:   Assessment/Plan:  78 y.o. female with anemia felt to be due to chronic kidney disease.  She was also found to be iron deficient and B12 deficient.  She received IV iron 6 months ago and her iron stores remain adequate.  She continues B12 injections monthly.  I recommended she see Dr. Lyda Jester for at least a colonoscopy.  She continues to decline screening mammogram. I will plan to see her back in 3 months for repeat clinical assessment  The  patient understands all the plans discussed today and is in agreement with them.  She knows to contact our office if she develops symptoms of worsening anemia or other concerns prior to her next appointment    Marvia Pickles, PA-C

## 2022-12-05 ENCOUNTER — Telehealth: Payer: Self-pay

## 2022-12-05 NOTE — Telephone Encounter (Signed)
Patient had an appt colonoscopy in 2014, 2017 last office visit they asked patient to set up a coloscopy at that time and one was never scheduled. They will fax you the last note.

## 2022-12-05 NOTE — Telephone Encounter (Signed)
-----  Message from Marvia Pickles, PA-C sent at 12/04/2022  5:19 PM EST ----- Please ask Dr. Crisoforo [place] office to see when her last visit was and what was recommended. Thanks

## 2022-12-06 ENCOUNTER — Ambulatory Visit: Payer: Medicare PPO | Admitting: Hematology and Oncology

## 2022-12-06 ENCOUNTER — Inpatient Hospital Stay: Payer: Medicare PPO | Attending: Hematology and Oncology

## 2022-12-11 NOTE — Progress Notes (Unsigned)
Cardiology Office Note:    Date:  12/12/2022   ID:  Carmen Ford, DOB May 08, 1946, MRN [id number]  PCP:  Patient, No Pcp Per  Cardiologist:  Shirlee More, MD    Referring MD: Dellia Beckwith, *    ASSESSMENT:    1. Coronary artery disease involving native coronary artery of native heart with angina pectoris ([name])   2. Hypertensive heart disease with heart failure (HCC)   3. Stage 3b chronic kidney disease (HCC)   4. Paroxysmal atrial fibrillation ([name])   5. Chronic anticoagulation   6. Pacemaker    PLAN:    In order of problems listed above:  Stable CAD having no angina after CABG and on current medical therapy she will continue her anticoagulant with a history of atrial fibrillation beta-blocker but hold it the morning of hemodialysis to avoid hypotension along with her loop diuretic Improved with hemodialysis ultrafiltration hold carvedilol morning of dialysis to avoid hypotension Stable arrhythmia and pacemaker followed in our device clinic continue anticoagulant   Next appointment: 6 months   Medication Adjustments/Labs and Tests Ordered: Current medicines are reviewed at length with the patient today.  Concerns regarding medicines are outlined above.  No orders of the defined types were placed in this encounter.  No orders of the defined types were placed in this encounter.  Chief complaint follow-up CAD pacemaker and heart failure after complex hospitalization now in skilled nursing rehab and having maintenance hemodialysis   History of Present Illness:    Carmen Ford is a 77 y.o. female with a hx of coronary artery disease with CABG chronic diastolic heart failure with hypertension and stage IV CKD followed by nephrology [facility] hyperlipidemia recurrent hyperkalemia anemia and secondary AV block with permanent dual-chamber pacemaker last seen 04/17/2022.  She is admitted to [facility] discharged 11/22/2022 discharge  diagnosis sepsis due to Staph aureus with acute renal failure superimposed on chronic CKD Staph aureus bacteremia respiratory failure with hypoxia discitis of the lumbar region psoas abscess paroxysmal atrial fibrillation.  Compliance with diet, lifestyle and medications: Yes  I was unaware that she is in a skilled nursing he continues on maintenance hemodialysis She is engaged in physical therapy with a goal of returning home She is not having edema shortness of breath chest pain palpitation or syncope She tells me that she had hypotension on dialysis yesterday" I have her hold carvedilol the morning is that she has hemodialysis She ask if she needs to continue her loop diuretic I told her yes generally these issues are managed by nephrology. Past Medical History:  Diagnosis Date   Acquired cavovarus deformity of left foot 12/24/2017   Acute colitis 05/08/2017   Overview:  CT of the abdomen shows right colitis.  Will administer IV Flagyl and Cipro, clear liquid diet, check stool for c diff and culture.  Will recheck lactic acid for sepsis.  Was supposed to have colonoscopy recently but has not had one for years.    Last Assessment & Plan:  CT of the abdomen obtained showed right colitis.  Tolerated clear liquid diet.  States has had no further diarrhea si   Acute on chronic kidney failure ([place]) 05/08/2017   Last Assessment & Plan:  Patient presented to ER with diarrhea, renal panel indicated dehydration.  Reviewed past renal panels and creatinine is elevated from baseline of about 1.5 and now 2.3. Diarrhea has resolved per patient report and she is tolerating fluids, will advance to regular  diabetic, cardiac diet. Plan: Resume home lasix lasix  Bun and creatine trending down Diarrhea resolved Tolerat   Acute renal failure superimposed on stage 4 chronic kidney disease ([name]) 05/08/2017   Last Assessment & Plan:  Recheck BMP and potassium and was trending favorably No change in meds until results known and  is ok and aware to hold ARB and diuretic   Anemia    Anemia in chronic kidney disease 10/15/2016   Bradycardia    Overview:  Not on a beta blocker with sinus bradycardia and Mobitz 1 second degree AV block   Bradycardia by electrocardiography 09/30/2018   Last Assessment & Plan:  Symptomatic and not paced and got well enough to d/c--will see how it does going forward  Last Assessment & Plan:  Formatting of this note might be different from the original. To get paced--[place]   CHF (congestive heart failure) ([place]) 02/26/2016   Last Assessment & Plan:  Formatting of this note might be different from the original. Sees cardio and will do BNP today   Chronic diastolic heart failure ([place]) 02/26/2016   Overview:  Overview:  EF normal by echo 2013   Chronic ischemic heart disease    CKD (chronic kidney disease) 05/15/2017   Colitis, acute    Coronary artery disease    CABG  2011: LTA to LAD, SVG to M, SVG to PDA EF 40%   Coronary artery disease involving native coronary artery of native heart with angina pectoris ([place]) 02/26/2016   Degenerative arthritis of left knee 06/09/2017   Diabetes mellitus without complication ([name])    type II    Diabetic peripheral neuropathy ([place]) 12/14/2018   Last Assessment & Plan:  Sees endo They adjust her insulin  Last Assessment & Plan:  Formatting of this note might be different from the original. Sees endo   Encounter for routine adult physical exam with abnormal findings 01/13/2017   Last Assessment & Plan:  Formatting of this note might be different from the original. Declines MMG Needs labs I have nothing and she does remember the A1C was good  No change meds and reconciled and cleaned up the problem list  6 months f/u   Essential (primary) hypertension 05/15/2017   Essential hypertension 05/15/2017   Last Assessment & Plan:  Do not restart ARB. Avoid beta-blockers, diltiazem, verapamil.  Start amlodipine today  Last Assessment & Plan:  Formatting of this note might  be different from the original. Doing well and no changes in treatment   Fatigue 10/15/2016   Hammer toe 12/24/2017   History of total knee arthroplasty 06/30/2018   Hx of CABG 02/26/2016   Overview:  2011, LTA to LAD, SVG to M, SVG to PDA EF 40%  Formatting of this note might be different from the original. 2011, LTA to LAD, SVG to M, SVG to PDA EF 40%   Hyperkalemia 09/30/2018   Last Assessment & Plan:  Mild.  Improved.  Continue to follow.  May have been contributing to some of her rhythm disturbances, but I agree likely not the primary cause.   Hyperlipidemia 05/15/2017   Hypertensive heart disease with heart failure ([place])    Hypomagnesemia 05/09/2017   Last Assessment & Plan:  Magnesium level 1.7 likely secondary to diarrhea loss Plan: Replaced    Metatarsalgia 12/24/2017   Morbid obesity ([place]) 04/20/2019   Formatting of this note might be different from the original. 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 Had retinal surgery and cataract surgery and so so results   Vitamin D deficiency 05/15/2017    Past Surgical History:  Procedure Laterality Date   ACHILLES TENDON SURGERY Left 2005   inserted a new tendon   CARDIAC SURGERY     CATARACT EXTRACTION, BILATERAL     CHOLECYSTECTOMY  02/2016   COLONOSCOPY     CORONARY ARTERY BYPASS GRAFT  2011   [facility]   KNEE ARTHROPLASTY Left 06/09/2017   Procedure: LEFT TOTAL KNEE ARTHROPLASTY WITH COMPUTER NAVIGATION;  Surgeon: Rod Can, MD;  Location: [facility];  Service: Orthopedics;   Laterality: Left;  NEED RNFA   PACEMAKER IMPLANT N/A 08/10/2019   Procedure: PACEMAKER IMPLANT;  Surgeon: Constance Haw, MD;  Location: [facility];  Service: Cardiovascular;  Laterality: N/A;   PATELLAR TENDON REPAIR Left 07/03/2017   Procedure: PATELLA TENDON REPAIR LEFT KNEE;  Surgeon: Rod Can, MD;  Location: [facility];  Service: Orthopedics;  Laterality: Left;   VARICOSE VEIN SURGERY Bilateral 1990's    Current Medications: Current Meds  Medication Sig   acetaminophen (TYLENOL) 325 MG tablet Take 1-2 tablets (325-650 mg total) by mouth every 4 (four) hours as needed for mild pain.   apixaban (ELIQUIS) 2.5 MG TABS tablet Take 2.5 mg by mouth 2 (two) times daily.   carvedilol (COREG) 12.5 MG tablet Take 12.5 mg by mouth 2 (two) times daily with a meal.   Cholecalciferol (VITAMIN D-1000 MAX ST) 25 MCG (1000 UT) tablet Take by mouth.   ferrous sulfate 325 (65 FE) MG tablet Take 1 tablet by mouth daily with breakfast.   furosemide (LASIX) 20 MG tablet Take 20 mg by mouth daily as needed for edema.    furosemide (LASIX) 80 MG tablet Take 80 mg by mouth daily.   gabapentin (NEURONTIN) 300 MG capsule Take 300 mg by mouth at bedtime.   Glucagon 1 MG/0.2ML SOLN Glucagon Emergency Kit 1 mg solution for injection   insulin aspart (NOVOLOG FLEXPEN) 100 UNIT/ML FlexPen 3 (three) times daily.   insulin glargine (LANTUS) 100 UNIT/ML injection Inject 9 Units into the skin 2 (two) times daily.    insulin lispro (HUMALOG KWIKPEN) 100 UNIT/ML KwikPen INJECT 5 UNITS SUBCUTANEOUSLY WITH SMALL MEAL INJECT 7 UNITS WITH MEDIUM MEAL AND 9 UNITS WITH LARGE MEAL PLUS CS 130 25. MDD 60 UNITS   omega-3 acid ethyl esters (LOVAZA) 1 G capsule Take 2 g by mouth 2 (two) times daily.    polyethylene glycol (MIRALAX / GLYCOLAX) 17 g packet Take 17 g by mouth daily.   rOPINIRole (REQUIP) 2 MG tablet Take 2 mg by mouth at bedtime.    senna (SENOKOT) 8.6 MG tablet Take 1 tablet by mouth in the morning.    SPIKEVAX 50 MCG/0.5ML SUSY Inject into the muscle.     Allergies:   Penicillamine, Penicillins, and Statins   Social History   Socioeconomic History   Marital status: Married    Spouse name: Not on file   Number of children: Not on file   Years of education: Not on  file   Highest education level: Not on file  Occupational History   Not on file  Tobacco Use   Smoking status: Never   Smokeless tobacco: Never  Vaping Use   Vaping Use: Never used  Substance and Sexual Activity   Alcohol use: No    Alcohol/week: 0.0 standard drinks of alcohol   Drug use: No   Sexual activity: Not on file  Other Topics Concern   Not on file  Social History Narrative   Not on file   Social Determinants of Health   Financial Resource Strain: Not on file  Food Insecurity: Not on file  Transportation Needs: Not on file  Physical Activity: Not on file  Stress: Not on file  Social Connections: Not on file     Family History: The patient's family history includes Cancer in her brother; Congestive Heart Failure in her father; Diabetes in her mother; Heart failure in her mother; Hypertension in her mother and son. ROS:   Please see the history of present illness.    All other systems reviewed and are negative.  EKGs/Labs/Other Studies Reviewed:    The following studies were reviewed today:  Recent Labs: 04/17/2022: NT-Pro BNP 1,339 09/05/2022: ALT 15; BUN 27; Creatinine 1.6; Hemoglobin 11.6; Hemoglobin 11.6; Platelets 220; Platelets 220; Potassium 5.1; Sodium 140; TSH 2.265  Recent Lipid Panel    Component Value Date/Time   CHOL 184 04/17/2022 0835   TRIG 121 04/17/2022 0835   HDL 32 (L) 04/17/2022 0835   CHOLHDL 5.8 (H) 04/17/2022 0835   LDLCALC 130 (H) 04/17/2022 0835    Physical Exam:    VS:  BP 105/64 (BP Location: Right Arm, Patient Position: Sitting)   Pulse 62   Ht 5\' 5"  (1.651 m)   SpO2 97%   BMI 39.94 kg/m     Wt Readings from Last 3 Encounters:  10/16/22 240 lb (108.9  kg)  10/09/22 240 lb 4 oz (109 kg)  10/02/22 245 lb (111.1 kg)     GEN:  Well nourished, well developed in no acute distress HEENT: Normal NECK: No JVD; No carotid bruits LYMPHATICS: No lymphadenopathy CARDIAC: RRR, no murmurs, rubs, gallops RESPIRATORY:  Clear to auscultation without rales, wheezing or rhonchi  ABDOMEN: Soft, non-tender, non-distended MUSCULOSKELETAL:  No edema; No deformity  SKIN: Warm and dry NEUROLOGIC:  Alert and oriented x 3 PSYCHIATRIC:  Normal affect   Seen with Leighton Ruff, CMA chaperone   Signed, Shirlee More, MD  12/12/2022 8:50 AM    [place]

## 2022-12-12 ENCOUNTER — Encounter: Payer: Self-pay | Admitting: Cardiology

## 2022-12-12 ENCOUNTER — Ambulatory Visit: Payer: Medicare PPO | Attending: Cardiology | Admitting: Cardiology

## 2022-12-12 VITALS — BP 105/64 | HR 62 | Ht 65.0 in

## 2022-12-12 DIAGNOSIS — I25119 Atherosclerotic heart disease of native coronary artery with unspecified angina pectoris: Secondary | ICD-10-CM | POA: Diagnosis not present

## 2022-12-12 DIAGNOSIS — N1832 Chronic kidney disease, stage 3b: Secondary | ICD-10-CM | POA: Diagnosis not present

## 2022-12-12 DIAGNOSIS — I110 Hypertensive heart disease with heart failure: Secondary | ICD-10-CM | POA: Diagnosis not present

## 2022-12-12 DIAGNOSIS — Z950 Presence of cardiac pacemaker: Secondary | ICD-10-CM

## 2022-12-12 DIAGNOSIS — Z7901 Long term (current) use of anticoagulants: Secondary | ICD-10-CM

## 2022-12-12 DIAGNOSIS — I480 Paroxysmal atrial fibrillation: Secondary | ICD-10-CM

## 2022-12-12 NOTE — Patient Instructions (Addendum)
Medication Instructions:  Your physician recommends that you continue on your current medications as directed. Please refer to the Current Medication list given to you today.  *If you need a refill on your cardiac medications before your next appointment, please call your pharmacy*   Lab Work: None If you have labs (blood work) drawn today and your tests are completely normal, you will receive your results only by: [place] (if you have MyChart) OR A paper copy in the mail If you have any lab test that is abnormal or we need to change your treatment, we will call you to review the results.   Testing/Procedures: None   Follow-Up: At [facility], you and your health needs are our priority.  As part of our continuing mission to provide you with exceptional heart care, we have created designated Provider Care Teams.  These Care Teams include your primary Cardiologist (physician) and Advanced Practice Providers (APPs -  Physician Assistants and Nurse Practitioners) who all work together to provide you with the care you need, when you need it.  We recommend signing up for the patient portal called "MyChart".  Sign up information is provided on this After Visit Summary.  MyChart is used to connect with patients for Virtual Visits (Telemedicine).  Patients are able to view lab/test results, encounter notes, upcoming appointments, etc.  Non-urgent messages can be sent to your provider as well.   To learn more about what you can do with MyChart, go to [email or web address].    Your next appointment:   6 month(s)  Provider:   Shirlee More, MD    Other Instructions Do not take AM coreg the day of hemodialysis   This visit was accompanied by Leighton Ruff, [place].

## 2022-12-12 NOTE — Discharge Summary (Signed)
This visit was accompanied by Klani Caridi, CMA.  

## 2022-12-19 NOTE — Progress Notes (Signed)
Remote pacemaker transmission.   

## 2022-12-20 NOTE — Progress Notes (Signed)
Remote pacemaker transmission.   

## 2023-01-03 ENCOUNTER — Telehealth: Payer: Self-pay

## 2023-01-03 NOTE — Telephone Encounter (Signed)
Request received from [facility] requesting MRI clearance for this Pt.  Attempted to fax paperwork back, but would not go through.  Left confidential VM for Helene Kelp with scheduling advising device IS MRI compatible. Ok to proceed.  Left phone number for device clinic if any further needs.

## 2023-01-11 DIAGNOSIS — M4645 Discitis, unspecified, thoracolumbar region: Secondary | ICD-10-CM | POA: Insufficient documentation

## 2023-01-11 HISTORY — DX: Discitis, unspecified, thoracolumbar region: M46.45

## 2023-01-17 DIAGNOSIS — K862 Cyst of pancreas: Secondary | ICD-10-CM

## 2023-01-17 HISTORY — DX: Cyst of pancreas: K86.2

## 2023-02-07 DIAGNOSIS — R7989 Other specified abnormal findings of blood chemistry: Secondary | ICD-10-CM

## 2023-02-07 DIAGNOSIS — R4182 Altered mental status, unspecified: Secondary | ICD-10-CM

## 2023-02-07 DIAGNOSIS — Z8679 Personal history of other diseases of the circulatory system: Secondary | ICD-10-CM

## 2023-02-07 HISTORY — DX: Other specified abnormal findings of blood chemistry: R79.89

## 2023-02-07 HISTORY — DX: Personal history of other diseases of the circulatory system: Z86.79

## 2023-02-07 HISTORY — DX: Altered mental status, unspecified: R41.82

## 2023-02-10 DIAGNOSIS — R339 Retention of urine, unspecified: Secondary | ICD-10-CM | POA: Insufficient documentation

## 2023-02-10 HISTORY — DX: Retention of urine, unspecified: R33.9

## 2023-02-11 DIAGNOSIS — M4626 Osteomyelitis of vertebra, lumbar region: Secondary | ICD-10-CM | POA: Insufficient documentation

## 2023-02-11 HISTORY — DX: Osteomyelitis of vertebra, lumbar region: M46.26

## 2023-02-13 DIAGNOSIS — K5900 Constipation, unspecified: Secondary | ICD-10-CM

## 2023-02-13 HISTORY — DX: Constipation, unspecified: K59.00

## 2023-02-17 ENCOUNTER — Encounter: Payer: Medicare PPO | Admitting: Cardiology

## 2023-02-26 ENCOUNTER — Ambulatory Visit (INDEPENDENT_AMBULATORY_CARE_PROVIDER_SITE_OTHER): Payer: Medicare PPO

## 2023-02-26 DIAGNOSIS — I495 Sick sinus syndrome: Secondary | ICD-10-CM

## 2023-02-26 LAB — CUP PACEART REMOTE DEVICE CHECK
Battery Remaining Longevity: 84 mo
Battery Voltage: 2.98 V
Brady Statistic RA Percent Paced: 0.02 %
Brady Statistic RV Percent Paced: 99.04 %
Date Time Interrogation Session: 20240423201649
Implantable Lead Connection Status: 753985
Implantable Lead Connection Status: 753985
Implantable Lead Implant Date: 20201006
Implantable Lead Implant Date: 20201006
Implantable Lead Location: 753859
Implantable Lead Location: 753860
Implantable Lead Model: 5076
Implantable Lead Model: 5076
Implantable Pulse Generator Implant Date: 20201006
Lead Channel Impedance Value: 266 Ohm
Lead Channel Impedance Value: 342 Ohm
Lead Channel Impedance Value: 380 Ohm
Lead Channel Impedance Value: 399 Ohm
Lead Channel Pacing Threshold Amplitude: 0.5 V
Lead Channel Pacing Threshold Amplitude: 0.625 V
Lead Channel Pacing Threshold Pulse Width: 0.4 ms
Lead Channel Pacing Threshold Pulse Width: 0.4 ms
Lead Channel Sensing Intrinsic Amplitude: 2 mV
Lead Channel Sensing Intrinsic Amplitude: 2 mV
Lead Channel Sensing Intrinsic Amplitude: 8.125 mV
Lead Channel Sensing Intrinsic Amplitude: 8.125 mV
Lead Channel Setting Pacing Amplitude: 1.5 V
Lead Channel Setting Pacing Amplitude: 2.5 V
Lead Channel Setting Pacing Pulse Width: 0.4 ms
Lead Channel Setting Sensing Sensitivity: 1.2 mV
Zone Setting Status: 755011

## 2023-03-03 ENCOUNTER — Encounter: Payer: Self-pay | Admitting: Cardiology

## 2023-03-03 ENCOUNTER — Ambulatory Visit: Payer: Medicare PPO | Attending: Cardiology | Admitting: Cardiology

## 2023-03-03 VITALS — BP 144/78 | HR 64 | Ht 65.0 in | Wt 205.0 lb

## 2023-03-03 DIAGNOSIS — D6869 Other thrombophilia: Secondary | ICD-10-CM

## 2023-03-03 DIAGNOSIS — R001 Bradycardia, unspecified: Secondary | ICD-10-CM | POA: Diagnosis not present

## 2023-03-03 DIAGNOSIS — I442 Atrioventricular block, complete: Secondary | ICD-10-CM | POA: Diagnosis not present

## 2023-03-03 DIAGNOSIS — I495 Sick sinus syndrome: Secondary | ICD-10-CM | POA: Diagnosis not present

## 2023-03-03 DIAGNOSIS — I4819 Other persistent atrial fibrillation: Secondary | ICD-10-CM

## 2023-03-03 LAB — CUP PACEART INCLINIC DEVICE CHECK
Battery Remaining Longevity: 93 mo
Battery Voltage: 2.98 V
Brady Statistic AP VP Percent: 95.51 %
Brady Statistic AP VS Percent: 0.02 %
Brady Statistic AS VP Percent: 4.49 %
Brady Statistic AS VS Percent: 0.08 %
Brady Statistic RA Percent Paced: 59.99 %
Brady Statistic RV Percent Paced: 98.94 %
Date Time Interrogation Session: 20240429141942
Implantable Lead Connection Status: 753985
Implantable Lead Connection Status: 753985
Implantable Lead Implant Date: 20201006
Implantable Lead Implant Date: 20201006
Implantable Lead Location: 753859
Implantable Lead Location: 753860
Implantable Lead Model: 5076
Implantable Lead Model: 5076
Implantable Pulse Generator Implant Date: 20201006
Lead Channel Impedance Value: 285 Ohm
Lead Channel Impedance Value: 380 Ohm
Lead Channel Impedance Value: 399 Ohm
Lead Channel Impedance Value: 437 Ohm
Lead Channel Pacing Threshold Amplitude: 0.5 V
Lead Channel Pacing Threshold Amplitude: 0.625 V
Lead Channel Pacing Threshold Pulse Width: 0.4 ms
Lead Channel Pacing Threshold Pulse Width: 0.4 ms
Lead Channel Sensing Intrinsic Amplitude: 1 mV
Lead Channel Sensing Intrinsic Amplitude: 1.625 mV
Lead Channel Sensing Intrinsic Amplitude: 8.875 mV
Lead Channel Sensing Intrinsic Amplitude: 9.375 mV
Lead Channel Setting Pacing Amplitude: 2.5 V
Lead Channel Setting Pacing Pulse Width: 0.4 ms
Lead Channel Setting Sensing Sensitivity: 1.2 mV
Zone Setting Status: 755011
Zone Setting Status: 755011

## 2023-03-03 MED ORDER — APIXABAN 5 MG PO TABS: 5.0000 mg | ORAL_TABLET | Freq: Two times a day (BID) | ORAL | 12 refills | Status: AC

## 2023-03-03 NOTE — Patient Instructions (Signed)
Medication Instructions:  Your physician has recommended you make the following change in your medication:  INCREASE Eliquis 5 mg twice daily  *If you need a refill on your cardiac medications before your next appointment, please call your pharmacy*   Lab Work: None ordered   Testing/Procedures: None ordered   Follow-Up: At [facility], you and your health needs are our priority.  As part of our continuing mission to provide you with exceptional heart care, we have created designated Provider Care Teams.  These Care Teams include your primary Cardiologist (physician) and Advanced Practice Providers (APPs -  Physician Assistants and Nurse Practitioners) who all work together to provide you with the care you need, when you need it.  Remote monitoring is used to monitor your Pacemaker or ICD from home. This monitoring reduces the number of office visits required to check your device to one time per year. It allows [place] to keep an eye on the functioning of your device to ensure it is working properly. You are scheduled for a device check from home on 05/28/2023. You may send your transmission at any time that day. If you have a wireless device, the transmission will be sent automatically. After your physician reviews your transmission, you will receive a postcard with your next transmission date.  Your next appointment:   1 year(s)  The format for your next appointment:   In Person  Provider:   Loman Brooklyn, MD    Thank you for choosing [facility]!!   Dory Horn, RN [phone]    Other Instructions Please call the office if you would like to proceed with cardioversion.   Electrical Cardioversion Electrical cardioversion is the delivery of a jolt of electricity to restore a normal rhythm to the heart. A rhythm that is too fast or is not regular (arrhythmia) keeps the heart from pumping blood well. There is also another type of cardioversion called a chemical  (pharmacologic) cardioversion. This is when your health care provider gives you one or more medicines to bring back your regular heart rhythm. Electrical cardioversion is done as a scheduled procedure for arrhythmiasthat are not life-threatening. Electrical cardioversion may also be done in an emergency for sudden life-threatening arrhythmias. Tell a health care provider about: Any allergies you have. All medicines you are taking, including vitamins, herbs, eye drops, creams, and over-the-counter medicines. Any problems you or family members have had with sedatives or anesthesia. Any bleeding problems you have. Any surgeries you have had, including a pacemaker, defibrillator, or other implanted device. Any medical conditions you have. Whether you are pregnant or may be pregnant. What are the risks? Your provider will talk with you about risks. These include: Allergic reactions to medicines. Irritation to the skin on your chest or back where the sticky pads (electrodes) or paddles were put during electrical cardioversion. A blood clot that breaks free and travels to other parts of your body, such as your brain. Return of a worse abnormal heart rhythm that will need to be treated with medicines, a pacemaker, or an implantable cardioverter defibrillator (ICD). What happens before the procedure? Medicines Your provider may give you: Blood-thinning medicines (anticoagulants) so your blood does not clot as easily. If your provider gives you this medicine, you may need to take it for 4 weeks before the procedure. Medicines to help stabilize your heart rate and rhythm. Ask your provider about: Changing or stopping your regular medicines. These include any diabetes medicines or blood thinners you take. Taking medicines such  as aspirin and ibuprofen. These medicines can thin your blood. Do not take them unless your provider tells you to. Taking over-the-counter medicines, vitamins, herbs, and  supplements. General instructions Follow instructions from your provider about what you may eat and drink. Do not put any lotions, powders, or ointments on your chest and back for 24 hours before the procedure. They can cause problems with the electrodes or paddles used to deliver electricity to your heart. Do not wear jewelry as this can interfere with delivering electricity to your heart. If you will be going home right after the procedure, plan to have a responsible adult: Take you home from the hospital or clinic. You will not be allowed to drive. Care for you for the time you are told. Tests You may have an exam or testing. This may include: Blood labs. A transesophageal echocardiogram (TEE). What happens during the procedure?     An IV will be inserted into one of your veins. You will be given a sedative. This helps you relax. Electrodes or metal paddles will be placed on your chest. They may be placed in one of these ways: One placed on your right chest, the other on the left ribs. One placed on your chest and the other on your back. An electrical shock will be delivered. The shock briefly stops (resets) your heart rhythm. Your provider will check to see if your heart rhythm is now normal. Some people need only one shock. Some need more to restore a normal heart rhythm. The procedure may vary among providers and hospitals. What happens after the procedure? Your blood pressure, heart rate, breathing rate, and blood oxygen level will be monitored until you leave the hospital or clinic. Your heart rhythm will be watched to make sure it does not change. This information is not intended to replace advice given to you by your health care provider. Make sure you discuss any questions you have with your health care provider. Document Revised: 06/13/2022 Document Reviewed: 06/13/2022 Elsevier Patient Education  2023 [employer].

## 2023-03-03 NOTE — Progress Notes (Signed)
Electrophysiology Office Note   Date:  03/03/2023   ID:  Carmen Ford, DOB Jan 20, 1946, MRN [id number]  PCP:  Patient, No Pcp Per  Cardiologist:  Dulce Sellar Primary Electrophysiologist:  Arvie Bartholomew [name], MD    No chief complaint on file.    History of Present Illness: Carmen Ford is a 77 y.o. female who is being seen today for the evaluation of atrial fibrillation at the request of No ref. provider found. Presenting today for electrophysiology evaluation.    She has a history significant for persistent atrial fibrillation, diastolic heart failure, coronary artery disease, hypertension, hyperlipidemia, CKD stage III.  Diagnosed with pneumonia and renal failure and was hospitalized.  She was noted to have episodes of AV block.  She then presented to her primary cardiologist in atrial fibrillation.  She is post Medtronic dual-chamber pacemaker implanted 08/10/2019.  She was hospitalized in [place] with discitis of the lumbar region, psoas abscess.  She apparently had staff aureus sepsis.  She is on doxycycline.  She has been in atrial fibrillation since December.  He feels fatigued, but she is unsure if it is due to her chronic medical issues or her atrial fibrillation.  Today, denies symptoms of palpitations, chest pain, shortness of breath, orthopnea, PND, lower extremity edema, claudication, dizziness, presyncope, syncope, bleeding, or neurologic sequela. The patient is tolerating medications without difficulties.      Past Medical History:  Diagnosis Date   Acquired cavovarus deformity of left foot 12/24/2017   Acute colitis 05/08/2017   Overview:  CT of the abdomen shows right colitis.  Fisher Hargadon administer IV Flagyl and Cipro, clear liquid diet, check stool for c diff and culture.  Carmen Ford recheck lactic acid for sepsis.  Was supposed to have colonoscopy recently but has not had one for years.    Last Assessment & Plan:  CT of the abdomen obtained showed right colitis.  Tolerated clear  liquid diet.  States has had no further diarrhea si   Acute on chronic kidney failure (HCC) 05/08/2017   Last Assessment & Plan:  Patient presented to ER with diarrhea, renal panel indicated dehydration.  Reviewed past renal panels and creatinine is elevated from baseline of about 1.5 and now 2.3. Diarrhea has resolved per patient report and she is tolerating fluids, Jill Stopka advance to regular diabetic, cardiac diet. Plan: Resume home lasix lasix  Bun and creatine trending down Diarrhea resolved Tolerat   Acute renal failure superimposed on stage 4 chronic kidney disease (HCC) 05/08/2017   Last Assessment & Plan:  Recheck BMP and potassium and was trending favorably No change in meds until results known and is ok and aware to hold ARB and diuretic   Anemia    Anemia in chronic kidney disease 10/15/2016   Bradycardia    Overview:  Not on a beta blocker with sinus bradycardia and Mobitz 1 second degree AV block   Bradycardia by electrocardiography 09/30/2018   Last Assessment & Plan:  Symptomatic and not paced and got well enough to d/c--Carmen Ford see how it does going forward  Last Assessment & Plan:  Formatting of this note might be different from the original. To get paced--[name]   CHF (congestive heart failure) (HCC) 02/26/2016   Last Assessment & Plan:  Formatting of this note might be different from the original. Sees cardio and Uliana Brinker do BNP today   Chronic diastolic heart failure (HCC) 02/26/2016   Overview:  Overview:  EF normal by echo 2013   Chronic ischemic heart  disease    CKD (chronic kidney disease) 05/15/2017   Colitis, acute    Coronary artery disease    CABG  2011: LTA to LAD, SVG to M, SVG to PDA EF 40%   Coronary artery disease involving native coronary artery of native heart with angina pectoris (HCC) 02/26/2016   Degenerative arthritis of left knee 06/09/2017   Diabetes mellitus without complication (HCC)    type II    Diabetic peripheral neuropathy (HCC) 12/14/2018   Last Assessment &  Plan:  Sees endo They adjust her insulin  Last Assessment & Plan:  Formatting of this note might be different from the original. Sees endo   Encounter for routine adult physical exam with abnormal findings 01/13/2017   Last Assessment & Plan:  Formatting of this note might be different from the original. Declines MMG Needs labs I have nothing and she does remember the A1C was good  No change meds and reconciled and cleaned up the problem list  6 months f/u   Essential (primary) hypertension 05/15/2017   Essential hypertension 05/15/2017   Last Assessment & Plan:  Do not restart ARB. Avoid beta-blockers, diltiazem, verapamil.  Start amlodipine today  Last Assessment & Plan:  Formatting of this note might be different from the original. Doing well and no changes in treatment   Fatigue 10/15/2016   Hammer toe 12/24/2017   History of total knee arthroplasty 06/30/2018   Hx of CABG 02/26/2016   Overview:  2011, LTA to LAD, SVG to M, SVG to PDA EF 40%  Formatting of this note might be different from the original. 2011, LTA to LAD, SVG to M, SVG to PDA EF 40%   Hyperkalemia 09/30/2018   Last Assessment & Plan:  Mild.  Improved.  Continue to follow.  May have been contributing to some of her rhythm disturbances, but I agree likely not the primary cause.   Hyperlipidemia 05/15/2017   Hypertensive heart disease with heart failure (HCC)    Hypomagnesemia 05/09/2017   Last Assessment & Plan:  Magnesium level 1.7 likely secondary to diarrhea loss Plan: Replaced    Metatarsalgia 12/24/2017   Morbid obesity (HCC) 04/20/2019   Formatting of this note might be different from the original. 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 Had retinal surgery and cataract surgery and so so results   Vitamin D deficiency 05/15/2017   Past Surgical History:  Procedure Laterality Date   ACHILLES TENDON SURGERY Left 2005   inserted a new tendon   CARDIAC SURGERY     CATARACT EXTRACTION, BILATERAL     CHOLECYSTECTOMY  02/2016   COLONOSCOPY     CORONARY ARTERY BYPASS GRAFT  2011   [facility]   KNEE ARTHROPLASTY Left 06/09/2017   Procedure: LEFT TOTAL KNEE ARTHROPLASTY WITH COMPUTER NAVIGATION;  Surgeon: Samson Frederic, MD;  Location: [facility] OR;  Service: Orthopedics;  Laterality: Left;  NEED RNFA   PACEMAKER IMPLANT N/A 08/10/2019   Procedure: PACEMAKER IMPLANT;  Surgeon: Regan Lemming, MD;  Location: [facility] INVASIVE CV LAB;  Service: Cardiovascular;  Laterality: N/A;   PATELLAR TENDON REPAIR Left 07/03/2017   Procedure: PATELLA TENDON REPAIR LEFT KNEE;  Surgeon: Samson Frederic, MD;  Location: [facility] ORS;  Service: Orthopedics;  Laterality: Left;   VARICOSE VEIN SURGERY Bilateral 1990's     Current Outpatient Medications  Medication Sig Dispense Refill   acetaminophen (TYLENOL) 325 MG tablet Take 1-2 tablets (325-650 mg total) by mouth every 4 (four) hours as needed for mild pain.     apixaban (ELIQUIS) 5 MG TABS tablet Take 1 tablet (5 mg total) by mouth 2 (two) times daily. 60 tablet 12   carvedilol (COREG) 12.5 MG tablet Take 12.5 mg by mouth 2 (two) times daily with a meal.     ferrous sulfate 325 (65 FE) MG tablet Take 1 tablet by mouth daily with breakfast.     furosemide (LASIX) 80 MG tablet Take 80 mg by mouth daily.     gabapentin (NEURONTIN) 100 MG capsule Take 100 mg by mouth 2 (two) times daily.     insulin lispro (HUMALOG KWIKPEN) 100 UNIT/ML KwikPen INJECT 5 UNITS SUBCUTANEOUSLY WITH SMALL MEAL INJECT 7 UNITS WITH MEDIUM MEAL AND 9 UNITS WITH LARGE MEAL PLUS  CS 130 25. MDD 60 UNITS     QUEtiapine (SEROQUEL) 25 MG tablet Take 25 mg by mouth 3 (three) times daily.     rOPINIRole (REQUIP) 2 MG tablet Take 2 mg by mouth at bedtime.      SPIKEVAX 50 MCG/0.5ML SUSY Inject into the muscle.     furosemide (LASIX) 20 MG tablet Take 20 mg by mouth daily as needed for edema.      Glucagon 1 MG/0.2ML SOLN Glucagon Emergency Kit 1 mg solution for injection     insulin aspart (NOVOLOG FLEXPEN) 100 UNIT/ML FlexPen 3 (three) times daily.     insulin glargine (LANTUS) 100 UNIT/ML injection Inject 9 Units into the skin 2 (two) times daily.      No current facility-administered medications for this visit.    Allergies:   Ace inhibitors, Penicillamine, Valsartan, Penicillins, and Statins   Social History:  The patient  reports that she has never smoked. She has never used smokeless tobacco. She reports that she does not drink alcohol and does not use drugs.   Family History:  The patient's family history includes Cancer in her brother; Congestive Heart Failure in her father; Diabetes in her mother; Heart failure in her mother; Hypertension in her mother and son.   ROS:  Please see the history of present illness.   Otherwise, review of systems is positive for none.   All other systems are reviewed and negative.   PHYSICAL EXAM: VS:  BP (!) 144/78   Pulse 64   Ht 5\' 5"  (1.651 m)   Wt 205 lb (93 kg)   SpO2 96%   BMI 34.11 kg/m  , BMI Body mass index is 34.11 kg/m. GEN: Well nourished, well developed, in no acute distress  HEENT: normal  Neck: no JVD, carotid bruits, or masses Cardiac: RRR; no murmurs, rubs, or gallops,no edema  Respiratory:  clear to auscultation bilaterally, normal work of breathing GI: soft, nontender, nondistended, + BS MS: no deformity or atrophy  Skin: warm and dry Neuro:  Strength and sensation are intact Psych: euthymic mood, full affect  EKG:  EKG is ordered today. Personal review of the ekg ordered shows AF, V paced     Recent Labs: 04/17/2022: NT-Pro BNP 1,339 09/05/2022: ALT 15; BUN 27; Creatinine 1.6; Hemoglobin  11.6; Hemoglobin 11.6; Platelets 220; Platelets 220; Potassium 5.1; Sodium 140; TSH 2.265    Lipid Panel     Component Value Date/Time   CHOL 184 04/17/2022 0835   TRIG 121 04/17/2022 0835   HDL 32 (L) 04/17/2022 0835   CHOLHDL 5.8 (H) 04/17/2022 0835   LDLCALC 130 (H) 04/17/2022 0835     Wt Readings from Last 3 Encounters:  03/03/23 205 lb (93 kg)  10/16/22 240 lb (108.9 kg)  10/09/22 240 lb 4 oz (109 kg)      Other studies Reviewed: Additional studies/ records that were reviewed today include: Epic notes   ASSESSMENT AND PLAN:  1.  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  Sensing, threshold, impedance within normal limits.  No changes.  2.  Persistent atrial fibrillation: Currently on Eliquis.  CHA2DS2-VASc of 4.  In atrial fibrillation since December.  She feels weak and fatigued but is unsure if this is due to her recent illness or atrial fibrillation.  I did offer amiodarone and cardioversion.  She Kyanne Rials let us know if she wishes to move forward with this.  She is on Eliquis 2.5 mg twice daily.  Batu Cassin increase to 5 mg.  3.  Secondary hypercoagulable state: Currently on Eliquis for atrial fibrillation.  Current medicines are reviewed at length with the patient today.   The patient does not have concerns regarding her medicines.  The following changes were made today: none  Labs/ tests ordered today include:  Orders Placed This Encounter  Procedures   EKG 12-Lead     Disposition:   FU 12 months  Signed, Ty Buntrock [name], MD  03/03/2023 2:21 PM     [facility] [address] [place] [address] [phone] (office) [phone] (fax)

## 2023-03-04 DIAGNOSIS — N1832 Chronic kidney disease, stage 3b: Secondary | ICD-10-CM

## 2023-03-04 HISTORY — DX: Chronic kidney disease, stage 3b: N18.32

## 2023-03-25 NOTE — Progress Notes (Signed)
Remote pacemaker transmission.   

## 2023-04-16 DIAGNOSIS — E663 Overweight: Secondary | ICD-10-CM | POA: Insufficient documentation

## 2023-04-16 DIAGNOSIS — Z79891 Long term (current) use of opiate analgesic: Secondary | ICD-10-CM

## 2023-04-16 HISTORY — DX: Long term (current) use of opiate analgesic: Z79.891

## 2023-04-21 ENCOUNTER — Telehealth: Payer: Self-pay | Admitting: Cardiology

## 2023-04-21 NOTE — Telephone Encounter (Signed)
Daughter calling to see if it would be okay for the patient to use a leg massager, to help with her swelling. Please advise

## 2023-04-21 NOTE — Telephone Encounter (Signed)
Spoke with Lupita Leash per DPR. Pt is complaining of fatigue and swelling in both lower legs. Pt is not taking furosemide due to recent kidney injury/functions. Advised to get daily weights if possible, wear compression hose and ok to use SCD's for sx. Advised to have home health nurse evaluate her legs when she comes as well. Lupita Leash verbalized understanding and had no additional questions.

## 2023-04-25 DIAGNOSIS — R911 Solitary pulmonary nodule: Secondary | ICD-10-CM

## 2023-04-25 DIAGNOSIS — M4625 Osteomyelitis of vertebra, thoracolumbar region: Secondary | ICD-10-CM | POA: Insufficient documentation

## 2023-04-25 DIAGNOSIS — M549 Dorsalgia, unspecified: Secondary | ICD-10-CM | POA: Insufficient documentation

## 2023-04-25 HISTORY — DX: Solitary pulmonary nodule: R91.1

## 2023-04-25 HISTORY — DX: Osteomyelitis of vertebra, thoracolumbar region: M46.25

## 2023-04-25 HISTORY — DX: Dorsalgia, unspecified: M54.9

## 2023-05-21 ENCOUNTER — Other Ambulatory Visit: Payer: Self-pay

## 2023-05-25 NOTE — Progress Notes (Unsigned)
Cardiology Office Note:    Date:  05/26/2023   ID:  Carmen Ford, DOB 08-21-1946, MRN [id number]  PCP:  Patient, No Pcp Per  Cardiologist:  Norman Herrlich, MD    Referring MD: No ref. provider found    ASSESSMENT:    1. Hypertensive heart disease with heart failure (HCC)   2. Stage 3b chronic kidney disease (HCC)   3. Complete AV block (HCC)   4. Chronic anticoagulation    PLAN:    In order of problems listed above:  Decompensated restart furosemide 40 mg day Stable pacemaker function Continue her eliquis   Next appointment: 3 months   Medication Adjustments/Labs and Tests Ordered: Current medicines are reviewed at length with the patient today.  Concerns regarding medicines are outlined above.  No orders of the defined types were placed in this encounter.  No orders of the defined types were placed in this encounter.    History of Present Illness:    Carmen Ford is a 77 y.o. female with a hx of coronary artery disease with CABG chronic diastolic heart failure hypertension stage IV CKD hyperlipidemia anemia related to chronic kidney disease hyperkalemia and second-degree AV block with permanent dual-chamber pacemaker followed in our device clinic last seen 01/01/2023.  Her last device check 03/03/2023 showed normal pacemaker and lead parameters with persistent atrial fibrillation and VVIR pacing mode estimated longevity 7 years.  Compliance with diet, lifestyle and medications: She tells me she was just discharged from the hospital [facility] I cannot see any records in care everywhere Despite the fact she has had a marked weight loss she is much more edematous she is not taking a loop diuretic her last creatinine was improved to 1.54 with a GFR of 35 cc and a potassium of 4.8 Fortunately at this time she is not short of breath no chest pain palpitation or syncope potassium 4.6 she has had trouble with previous hyper kalemia hemoglobin was also diminished at  8.5.  Past Medical History:  Diagnosis Date   Acquired cavovarus deformity of left foot 12/24/2017   Acute colitis 05/08/2017   Overview:  CT of the abdomen shows right colitis.  Will administer IV Flagyl and Cipro, clear liquid diet, check stool for c diff and culture.  Will recheck lactic acid for sepsis.  Was supposed to have colonoscopy recently but has not had one for years.    Last Assessment & Plan:  CT of the abdomen obtained showed right colitis.  Tolerated clear liquid diet.  States has had no further diarrhea si   Acute metabolic encephalopathy 10/30/2022   Last Assessment & Plan:    Improved, back to baseline.     Acute on chronic kidney failure (HCC) 05/08/2017   Last Assessment & Plan:  Patient presented to ER with diarrhea, renal panel indicated dehydration.  Reviewed past renal panels and creatinine is elevated from baseline of about 1.5 and now 2.3. Diarrhea has resolved per patient report and she is tolerating fluids, will advance to regular diabetic, cardiac diet. Plan: Resume home lasix lasix  Bun and creatine trending down Diarrhea resolved Tolerat   Acute renal failure superimposed on stage 4 chronic kidney disease (HCC) 05/08/2017   Last Assessment & Plan:  Recheck BMP and potassium and was trending favorably No change in meds until results known and is ok and aware to hold ARB and diuretic   Acute respiratory failure with hypoxia (HCC) 11/02/2022   Last Assessment & Plan:  Required several day treatment with BiPAP.  Associated with hypoxia and hypercapnia.   Status post BiPAP, respiratory status is improving,   - continue supplemental O2 and wean as tolerated   - volume removal with dialysis     Altered mental status 02/07/2023   Anemia    Anemia in chronic kidney disease 10/15/2016   B12 deficiency 09/06/2022   Back pain 04/25/2023   Bradycardia    Overview:  Not on a beta blocker with sinus bradycardia and Mobitz 1 second degree AV block   Bradycardia by  electrocardiography 09/30/2018   Last Assessment & Plan:  Symptomatic and not paced and got well enough to d/c--will see how it does going forward  Last Assessment & Plan:  Formatting of this note might be different from the original. To get paced--[place]   CHF (congestive heart failure) (HCC) 02/26/2016   Last Assessment & Plan:  Formatting of this note might be different from the original. Sees cardio and will do BNP today   Chronic diastolic heart failure (HCC) 02/26/2016   Overview:  Overview:  EF normal by echo 2013   Chronic ischemic heart disease    Chronic kidney disease, stage 3b (HCC) 03/04/2023   Chronic use of opiate for therapeutic purpose 04/16/2023   Last Assessment & Plan:    Patient and I have discussed the hazardous effects of continued opiate pain medication usage. Risks and benefits of above medications including but not limited to possibility of hyperalgesia, respiratory depression, sedation, and even death were discussed with the patient who expressed an understanding. Patient is willing to assume these health risks as mentioned above    CKD (chronic kidney disease) 05/15/2017   Colitis, acute    Constipation 02/13/2023   Last Assessment & Plan:    Improved.   Continue schedule senna, MiraLax with newly added p.r.n. Colace.   Last bowel movement reported as medium formed on the evening of April 15th.     Coronary artery disease    CABG  2011: LTA to LAD, SVG to M, SVG to PDA EF 40%   Coronary artery disease involving native coronary artery of native heart with angina pectoris (HCC) 02/26/2016   Cyst of pancreas 01/17/2023   Last Assessment & Plan:    Noted on imaging, check MRI pancreas and noted stable   Recommended to have follow up imaging in 6-12 months     Degenerative arthritis of left knee 06/09/2017   Diabetes mellitus without complication (HCC)    type II    Diabetic peripheral neuropathy (HCC) 12/14/2018   Last Assessment & Plan:  Sees endo They adjust  her insulin  Last Assessment & Plan:  Formatting of this note might be different from the original. Sees endo   Discitis of thoracolumbar region 01/11/2023   Last Assessment & Plan:    Remote history of thoracolumbar diskitis at L1-2 and L5-S1 with possible infectious diskitis at T12-L1 and L2-L3.  She will continue to follow with ID and PCP for management.  Continue medications as previously outlined.  See right psoas abscess plan for additional     Elevated TSH 02/07/2023   Last Assessment & Plan:    Patient with mildly elevated free T4.  Will recheck TSH and free T4 in 4 weeks.  Suspect had these abnormal levels of TSH and free T4 may be related to acute illness     Encounter for routine adult physical exam with abnormal findings 01/13/2017   Last Assessment & Plan:  Formatting of this note might be different from the original. Declines MMG Needs labs I have nothing and she does remember the A1C was good  No change meds and reconciled and cleaned up the problem list  6 months f/u   Enterococcus UTI 10/25/2022   Last Assessment & Plan:    Present on admission.   Resolved.   Secondary to Enterococcus faecium.   Patient completed vancomycin April 12.     Essential (primary) hypertension 05/15/2017   Essential hypertension 05/15/2017   Last Assessment & Plan:  Do not restart ARB. Avoid beta-blockers, diltiazem, verapamil.  Start amlodipine today  Last Assessment & Plan:  Formatting of this note might be different from the original. Doing well and no changes in treatment   Fatigue 10/15/2016   Fluid overload 11/02/2022   Last Assessment & Plan:    Improving. Progressive during hospitalization with significant peripheral edema.    - continue Lasix 80 mg p.o. daily   - fluid removal with dialysis     Hammer toe 12/24/2017   History of atrial fibrillation 02/07/2023   Last Assessment & Plan:    Cardiovascular remains regular on examination.   Continue carvedilol 12.5 mg twice daily as well as  anticoagulation with apixaban 2.5 mg twice daily.   Patient had continued to remain ventricularly paced by telemetry therefore telemetry was discontinued April 14th.     History of total knee arthroplasty 06/30/2018   Hx of CABG 02/26/2016   Overview:  2011, LTA to LAD, SVG to M, SVG to PDA EF 40%  Formatting of this note might be different from the original. 2011, LTA to LAD, SVG to M, SVG to PDA EF 40%   Hyperkalemia 09/30/2018   Last Assessment & Plan:  Mild.  Improved.  Continue to follow.  May have been contributing to some of her rhythm disturbances, but I agree likely not the primary cause.   Hyperlipidemia 05/15/2017   Hypertensive heart disease with heart failure (HCC)    Hypomagnesemia 05/09/2017   Last Assessment & Plan:  Magnesium level 1.7 likely secondary to diarrhea loss Plan: Replaced    Hyponatremia 10/26/2022   Last Assessment & Plan:    - sodium 129 -> 131, continue to monitor     Infection of lumbar spine (HCC) 02/11/2023   Last Assessment & Plan:    Minimal pain now.  Residual pain had been radicular in nature.  Plan additional 1-2 months oral doxycycline.  Follow-up on May 2nd with Dr. Alla German, already scheduled.     Lung nodule 04/25/2023   Metatarsalgia 12/24/2017   Morbid obesity (HCC) 04/20/2019   Formatting of this note might be different from the original. 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  Will increase to 10 mcg 1 application every 7 days.  Will send in RX and follow-up as scheduled.  She will continue with  prn breatk through medications as ordered     Quadriparesis [facility]) 10/30/2022   Last Assessment & Plan:    Secondary to advanced age, multiple comorbidities, severity of acute  illness inactivity    - PT evaluated patient, she needs SNF placement at discharge     Restless legs syndrome 05/15/2017   Rupture of left patellar tendon 07/03/2017   Sepsis due to Staphylococcus aureus with acute renal failure (HCC) 10/26/2022   Last Assessment & Plan:    Present on admission. Secondary to OSSA bacteremia and MRI concerning for multilevel intervertebral disc edema throughout the thoracolumbar spine, nonspecific. Psoas abscess seen as well.  Sepsis now resolved.   Management outlined under appropriate problem-based listings.     Severe protein-calorie malnutrition (HCC) 11/02/2022   Last Assessment & Plan:    Progressive during hospitalization.  Related to decreased p.o. intake, severity of acute illness.  BMI 43.3.  Generalized weakness/debilitation.  Albumin 2.2.   - SLP evaluated, cleared for initiation of dysphagia diet     SSS (sick sinus syndrome) (HCC) 08/10/2019   Staphylococcus aureus bacteremia 10/26/2022   Last Assessment & Plan:    Blood cultures grew MSSA.   - TTE on 12/23 negative for vegetation.   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    Foley catheter once again discontinued April 15th with bladder trials after requiring replacement April 12th.   Fortunately no evidence for urinary retention but will continue monitoring additional 24 hours.   Foley catheter was replaced April 12th with urine demonstrate 2+ leukocyte but no nitrites and urine cul   Visual  disturbance of one eye 09/30/2018   Last Assessment & Plan:  Formatting of this note might be different from the original. Had retinal surgery and cataract surgery and so so results   Vitamin D deficiency 05/15/2017    Current Medications: Current Meds  Medication Sig   acetaminophen (TYLENOL) 325 MG tablet Take 1-2 tablets (325-650 mg total) by mouth every 4 (four) hours as needed for mild pain.   apixaban (ELIQUIS) 5 MG TABS tablet Take 1 tablet (5 mg total) by mouth 2 (two) times daily.   buprenorphine (BUTRANS) 10 MCG/HR PTWK Place 1 patch onto the skin once a week.   carvedilol (COREG) 12.5 MG tablet Take 12.5 mg by mouth 2 (two) times daily with a meal.   ceFAZolin 1 g in sodium chloride 0.9 % 500 mL 1 g by Other route 2 (two) times daily.   gabapentin (NEURONTIN) 100 MG capsule Take 100 mg by mouth 2 (two) times daily.   lidocaine (LIDODERM) 5 % Place 3 patches onto the skin daily.   ondansetron (ZOFRAN) 4 MG tablet Take 4 mg by mouth every 8 (eight) hours as needed for nausea or vomiting.   rOPINIRole (REQUIP) 2 MG tablet Take 2 mg by mouth at bedtime.       EKGs/Labs/Other Studies Reviewed:    The following studies were reviewed today:  Cardiac Studies & Procedures       ECHOCARDIOGRAM  ECHOCARDIOGRAM COMPLETE 02/15/2022  Narrative ECHOCARDIOGRAM REPORT    Patient Name:   Carmen Ford Date of Exam: 02/15/2022 Medical Rec #:  [id number]      Height:       65.0 in Accession #:    [id number]  Weight:       244.0 lb Date of Birth:  1946/01/16       BSA:          2.153 m Patient Age:    75 years       BP:           143/80 mmHg Patient Gender: F              HR:           63 bpm. Exam Location:  [place]  Procedure: 2D Echo, Cardiac Doppler, Color Doppler and Strain Analysis  Indications:    Chronic diastolic heart failure (HCC) [I50.32 (ICD-10-CM)]; Chronic ischemic heart disease [I25.9 (ICD-10-CM)]; Persistent atrial fibrillation (HCC) [I48.19  (ICD-10-CM)]  History:        Patient has no prior history of Echocardiogram examinations. CHF, CAD and Previous Myocardial Infarction, Prior CABG, Arrythmias:Atrial Fibrillation; Risk Factors:Hypertension.  Sonographer:    Margreta Journey RDCS Referring Phys: [id number] WILL MARTIN CAMNITZ  IMPRESSIONS   1. Left ventricular ejection fraction, by estimation, is 45 to 50%. The left ventricle has mildly decreased function. Left ventricular endocardial border not optimally defined to evaluate regional wall motion. The left ventricular internal cavity size was moderately dilated. There is moderate left ventricular hypertrophy. Left ventricular diastolic parameters are consistent with Grade I diastolic dysfunction (impaired relaxation). 2. Right ventricular systolic function was not well visualized. There is normal pulmonary artery systolic pressure. 3. On M mode. Left atrial size was moderately dilated. 4. The mitral valve is degenerative. No evidence of mitral valve regurgitation. No evidence of mitral stenosis. 5. The aortic valve is normal in structure. Aortic valve regurgitation is not visualized. No aortic stenosis is present. 6. The inferior vena cava is dilated in size with <50% respiratory variability, suggesting right atrial pressure of 15 mmHg.  FINDINGS Left Ventricle: Left ventricular ejection fraction, by estimation, is 45 to 50%. The left ventricle has mildly decreased function. Left ventricular endocardial border not optimally defined to evaluate regional wall motion. The left ventricular internal cavity size was moderately dilated. There is moderate left ventricular hypertrophy. Left ventricular diastolic parameters are consistent with Grade I diastolic dysfunction (impaired relaxation). Indeterminate filling pressures.  Right Ventricle: RV appears normal on limited Haslet view. Right ventricular systolic function was not well visualized. There is normal pulmonary artery systolic  pressure. The tricuspid regurgitant velocity is 2.42 m/s, and with an assumed right atrial pressure of 8 mmHg, the estimated right ventricular systolic pressure is 31.4 mmHg.  Left Atrium: On M mode. Left atrial size was moderately dilated.  Right Atrium: Right atrial size was normal in size.  Pericardium: There is no evidence of pericardial effusion.  Mitral Valve: The mitral valve is degenerative in appearance. Mild mitral annular calcification. No evidence of mitral valve regurgitation. No evidence of mitral valve stenosis.  Tricuspid Valve: The tricuspid valve is normal in structure. Tricuspid valve regurgitation is trivial. No evidence of tricuspid stenosis.  Aortic Valve: The aortic valve is normal in structure. Aortic valve regurgitation is not visualized. No aortic stenosis is present.  Pulmonic Valve: The pulmonic valve was not well visualized. Pulmonic valve regurgitation is not visualized. No evidence of pulmonic stenosis.  Aorta: The aortic arch was not well visualized, the ascending aorta was not well visualized and the aortic root was not well visualized.  Venous: The pulmonary veins were not well visualized. The inferior vena cava is dilated in size with less than 50% respiratory variability, suggesting right atrial pressure  of 15 mmHg.  IAS/Shunts: No atrial level shunt detected by color flow Doppler.   LEFT VENTRICLE PLAX 2D LVIDd:         5.70 cm   Diastology LVIDs:         4.40 cm   LV e' medial:    5.11 cm/s LV PW:         1.30 cm   LV E/e' medial:  14.2 LV IVS:        1.40 cm   LV e' lateral:   8.38 cm/s LVOT diam:     2.00 cm   LV E/e' lateral: 8.6 LV SV:         67 LV SV Index:   31 LVOT Area:     3.14 cm   RIGHT VENTRICLE            IVC RV Basal diam:  3.10 cm    IVC diam: 2.80 cm RV S prime:     5.66 cm/s TAPSE (M-mode): 2.0 cm  LEFT ATRIUM             Index        RIGHT ATRIUM           Index LA diam:        4.70 cm 2.18 cm/m   RA Area:     14.30  cm LA Vol (A2C):   55.0 ml 25.55 ml/m  RA Volume:   31.50 ml  14.63 ml/m LA Vol (A4C):   53.0 ml 24.62 ml/m LA Biplane Vol: 57.5 ml 26.71 ml/m AORTIC VALVE LVOT Vmax:   77.00 cm/s LVOT Vmean:  58.300 cm/s LVOT VTI:    0.213 m  AORTA Ao Root diam: 3.30 cm Ao Asc diam:  3.60 cm Ao Desc diam: 1.70 cm  MITRAL VALVE               TRICUSPID VALVE MV Area (PHT): 3.27 cm    TR Peak grad:   23.4 mmHg MV Decel Time: 232 msec    TR Vmax:        242.00 cm/s MV E velocity: 72.40 cm/s MV A velocity: 74.60 cm/s  SHUNTS MV E/A ratio:  0.97        Systemic VTI:  0.21 m Systemic Diam: 2.00 cm  Norman Herrlich MD Electronically signed by Norman Herrlich MD Signature Date/Time: 02/15/2022/12:44:14 PM    Final                 Recent Labs: 09/05/2022: ALT 15; BUN 27; Creatinine 1.6; Hemoglobin 11.6; Hemoglobin 11.6; Platelets 220; Platelets 220; Potassium 5.1; Sodium 140; TSH 2.265  Recent Lipid Panel    Component Value Date/Time   CHOL 184 04/17/2022 0835   TRIG 121 04/17/2022 0835   HDL 32 (L) 04/17/2022 0835   CHOLHDL 5.8 (H) 04/17/2022 0835   LDLCALC 130 (H) 04/17/2022 0835    Physical Exam:    VS:  BP 138/60   Pulse 60   Ht 5\' 5"  (1.651 m)   Wt 219 lb (99.3 kg)   SpO2 98%   BMI 36.44 kg/m     Wt Readings from Last 3 Encounters:  05/26/23 219 lb (99.3 kg)  03/03/23 205 lb (93 kg)  10/16/22 240 lb (108.9 kg)     GEN:  Well nourished, well developed in no acute distress HEENT: Normal NECK: No JVD; No carotid bruits LYMPHATICS: No lymphadenopathy CARDIAC: RRR, no murmurs, rubs, gallops RESPIRATORY:  Clear to auscultation without rales, wheezing  or rhonchi  ABDOMEN: Soft, non-tender, non-distended MUSCULOSKELETAL:   edema; No deformity  SKIN: Warm and dry NEUROLOGIC:  Alert and oriented x 3 PSYCHIATRIC:  Normal affect    Signed, Norman Herrlich, MD  05/26/2023 2:20 PM    [facility]

## 2023-05-26 ENCOUNTER — Ambulatory Visit: Payer: Medicare PPO | Attending: Cardiology | Admitting: Cardiology

## 2023-05-26 ENCOUNTER — Encounter: Payer: Self-pay | Admitting: Cardiology

## 2023-05-26 VITALS — BP 138/60 | HR 60 | Ht 65.0 in | Wt 219.0 lb

## 2023-05-26 DIAGNOSIS — I110 Hypertensive heart disease with heart failure: Secondary | ICD-10-CM

## 2023-05-26 DIAGNOSIS — N1832 Chronic kidney disease, stage 3b: Secondary | ICD-10-CM

## 2023-05-26 DIAGNOSIS — I442 Atrioventricular block, complete: Secondary | ICD-10-CM

## 2023-05-26 DIAGNOSIS — Z7901 Long term (current) use of anticoagulants: Secondary | ICD-10-CM

## 2023-05-26 MED ORDER — FUROSEMIDE 40 MG PO TABS: 40.0000 mg | ORAL_TABLET | Freq: Every day | ORAL | 3 refills | Status: AC

## 2023-05-26 NOTE — Addendum Note (Signed)
Addended by: Roxanne Mins I on: 05/26/2023 02:30 PM   Modules accepted: Orders

## 2023-05-26 NOTE — Patient Instructions (Signed)
Medication Instructions:  Your physician has recommended you make the following change in your medication:   START: Furosemide 40 mg daily  *If you need a refill on your cardiac medications before your next appointment, please call your pharmacy*   Lab Work: None If you have labs (blood work) drawn today and your tests are completely normal, you will receive your results only by: MyChart Message (if you have MyChart) OR A paper copy in the mail If you have any lab test that is abnormal or we need to change your treatment, we will call you to review the results.   Testing/Procedures: None   Follow-Up: At [facility], you and your health needs are our priority.  As part of our continuing mission to provide you with exceptional heart care, we have created designated Provider Care Teams.  These Care Teams include your primary Cardiologist (physician) and Advanced Practice Providers (APPs -  Physician Assistants and Nurse Practitioners) who all work together to provide you with the care you need, when you need it.  We recommend signing up for the patient portal called "MyChart".  Sign up information is provided on this After Visit Summary.  MyChart is used to connect with patients for Virtual Visits (Telemedicine).  Patients are able to view lab/test results, encounter notes, upcoming appointments, etc.  Non-urgent messages can be sent to your provider as well.   To learn more about what you can do with MyChart, go to [email or web address].    Your next appointment:   3 month(s)  Provider:   Norman Herrlich, MD    Other Instructions None

## 2023-05-28 ENCOUNTER — Ambulatory Visit (INDEPENDENT_AMBULATORY_CARE_PROVIDER_SITE_OTHER): Payer: Medicare PPO

## 2023-05-28 DIAGNOSIS — I495 Sick sinus syndrome: Secondary | ICD-10-CM | POA: Diagnosis not present

## 2023-05-31 LAB — CUP PACEART REMOTE DEVICE CHECK
Battery Remaining Longevity: 85 mo
Battery Voltage: 2.98 V
Brady Statistic AP VP Percent: 0 %
Brady Statistic AP VS Percent: 0 %
Brady Statistic AS VP Percent: 99.14 %
Brady Statistic AS VS Percent: 0.89 %
Brady Statistic RA Percent Paced: 0 %
Brady Statistic RV Percent Paced: 99.2 %
Date Time Interrogation Session: 20240727150754
Implantable Lead Connection Status: 753985
Implantable Lead Connection Status: 753985
Implantable Lead Implant Date: 20201006
Implantable Lead Implant Date: 20201006
Implantable Lead Location: 753859
Implantable Lead Location: 753860
Implantable Lead Model: 5076
Implantable Lead Model: 5076
Implantable Pulse Generator Implant Date: 20201006
Lead Channel Impedance Value: 247 Ohm
Lead Channel Impedance Value: 323 Ohm
Lead Channel Impedance Value: 380 Ohm
Lead Channel Impedance Value: 399 Ohm
Lead Channel Pacing Threshold Amplitude: 0.5 V
Lead Channel Pacing Threshold Amplitude: 0.625 V
Lead Channel Pacing Threshold Pulse Width: 0.4 ms
Lead Channel Pacing Threshold Pulse Width: 0.4 ms
Lead Channel Sensing Intrinsic Amplitude: 1.625 mV
Lead Channel Sensing Intrinsic Amplitude: 1.625 mV
Lead Channel Sensing Intrinsic Amplitude: 7.625 mV
Lead Channel Sensing Intrinsic Amplitude: 7.625 mV
Lead Channel Setting Pacing Amplitude: 2.5 V
Lead Channel Setting Pacing Pulse Width: 0.4 ms
Lead Channel Setting Sensing Sensitivity: 1.2 mV
Zone Setting Status: 755011
Zone Setting Status: 755011

## 2023-06-10 NOTE — Progress Notes (Signed)
Remote pacemaker transmission.   

## 2023-06-23 DIAGNOSIS — D6869 Other thrombophilia: Secondary | ICD-10-CM | POA: Insufficient documentation

## 2023-06-23 DIAGNOSIS — E11311 Type 2 diabetes mellitus with unspecified diabetic retinopathy with macular edema: Secondary | ICD-10-CM | POA: Insufficient documentation

## 2023-08-26 NOTE — Progress Notes (Deleted)
Severe protein-calorie malnutrition (HCC) 11/02/2022   Last Assessment & Plan:    Progressive during hospitalization.  Related to decreased p.o. intake, severity of acute illness.  BMI 43.3.  Generalized weakness/debilitation.  Albumin 2.2.   - SLP evaluated, cleared for initiation of dysphagia diet     SSS (sick sinus syndrome) (HCC) 08/10/2019   Staphylococcus aureus bacteremia 10/26/2022   Last Assessment & Plan:    Blood cultures grew MSSA.   - TTE on 12/23 negative for vegetation.   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    Foley catheter once again discontinued April 15th with bladder trials after requiring replacement April 12th.   Fortunately no evidence for urinary retention but will continue monitoring additional 24 hours.   Foley catheter was replaced April 12th with urine demonstrate 2+ leukocyte but no nitrites and urine cul   Visual disturbance of one eye 09/30/2018   Last Assessment & Plan:  Formatting of this note might be different from the original. Had retinal surgery and cataract surgery and so so results   Vitamin D deficiency 05/15/2017    Current Medications: Current Meds  Medication Sig   acetaminophen (TYLENOL) 325 MG tablet Take 1-2 tablets (325-650 mg total) by mouth every 4 (four) hours as needed for mild pain.   apixaban (ELIQUIS) 5 MG TABS tablet Take 1 tablet (5 mg total) by mouth 2 (two) times daily.   buprenorphine (BUTRANS) 10 MCG/HR PTWK  Place 1 patch onto the skin once a week.   carvedilol (COREG) 12.5 MG tablet Take 12.5 mg by mouth 2 (two) times daily with a meal.   ceFAZolin 1 g in sodium chloride 0.9 % 500 mL 1 g by Other route 2 (two) times daily.   furosemide (LASIX) 40 MG tablet Take 1 tablet (40 mg total) by mouth daily.   gabapentin (NEURONTIN) 100 MG capsule Take 100 mg by mouth 2 (two) times daily.   lidocaine (LIDODERM) 5 % Place 3 patches onto the skin daily.   ondansetron (ZOFRAN) 4 MG tablet Take 4 mg by mouth every 8 (eight) hours as needed for nausea or vomiting.   rOPINIRole (REQUIP) 2 MG tablet Take 2 mg by mouth at bedtime.       EKGs/Labs/Other Studies Reviewed:    The following studies were reviewed today:  Cardiac Studies & Procedures       ECHOCARDIOGRAM  ECHOCARDIOGRAM COMPLETE 02/15/2022  Narrative ECHOCARDIOGRAM REPORT    Patient Name:   Carmen Ford Date of Exam: 02/15/2022 Medical Rec #:  [id number]      Height:       65.0 in Accession #:    [id number]     Weight:       244.0 lb Date of Birth:  12-Jan-1946       BSA:          2.153 m Patient Age:    77 years       BP:           143/80 mmHg Patient Gender: F              HR:  Cardiology Office Note:    Date:  08/27/2023   ID:  Carmen Ford, DOB [birth year], MRN [id number]  PCP:  Patient, No Pcp Per  Cardiologist:  Norman Herrlich, MD    Referring MD: No ref. provider found    ASSESSMENT:    1. Coronary artery disease involving native coronary artery of native heart with angina pectoris (HCC)   2. Hypertensive heart disease with heart failure (HCC)   3. CKD (chronic kidney disease), stage IV (HCC)   4. Pacemaker   5. Complete AV block (HCC)   6. Persistent atrial fibrillation (HCC)   7. Chronic anticoagulation   8. Dyslipidemia    PLAN:    In order of problems listed above:  ***   Next appointment: ***   Medication Adjustments/Labs and Tests Ordered: Current medicines are reviewed at length with the patient today.  Concerns regarding medicines are outlined above.  No orders of the defined types were placed in this encounter.  No orders of the defined types were placed in this encounter.    History of Present Illness:    Carmen Ford is a 77 y.o. female with a hx of coronary artery disease with CABG hypertensive heart disease with chronic diastolic heart failure and stage IV CKD intermittent hyper kalemia hyperlipidemia anemia due to CKD and second-degree AV block with permanent dual-chamber pacemaker followed in device clinic last seen 05/26/2023. 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  Will administer IV Flagyl and Cipro, clear liquid diet, check stool for c diff and culture.  Will recheck lactic acid for sepsis.  Was supposed to have colonoscopy recently but has not had one for years.    Last Assessment & Plan:  CT of the abdomen obtained showed right colitis.  Tolerated clear liquid diet.  States has had no further diarrhea si   Acute metabolic  encephalopathy 10/30/2022   Last Assessment & Plan:    Improved, back to baseline.     Acute on chronic kidney failure (HCC) 05/08/2017   Last Assessment & Plan:  Patient presented to ER with diarrhea, renal panel indicated dehydration.  Reviewed past renal panels and creatinine is elevated from baseline of about 1.5 and now 2.3. Diarrhea has resolved per patient report and she is tolerating fluids, will advance to regular diabetic, cardiac diet. Plan: Resume home lasix lasix  Bun and creatine trending down Diarrhea resolved Tolerat   Acute renal failure superimposed on stage 4 chronic kidney disease (HCC) 05/08/2017   Last Assessment & Plan:  Recheck BMP and potassium and was trending favorably No change in meds until results known and is ok and aware to hold ARB and diuretic   Acute respiratory failure with hypoxia (HCC) 11/02/2022   Last Assessment & Plan:    Required several day treatment with BiPAP.  Associated with hypoxia and hypercapnia.   Status post BiPAP, respiratory status is improving,   - continue supplemental O2 and wean as tolerated   - volume removal with dialysis     Altered mental status 02/07/2023   Anemia    Anemia in chronic kidney disease 10/15/2016   B12 deficiency 09/06/2022   Back pain 04/25/2023   Bradycardia    Overview:  Not on a beta blocker with sinus bradycardia and Mobitz 1 second degree AV block  Severe protein-calorie malnutrition (HCC) 11/02/2022   Last Assessment & Plan:    Progressive during hospitalization.  Related to decreased p.o. intake, severity of acute illness.  BMI 43.3.  Generalized weakness/debilitation.  Albumin 2.2.   - SLP evaluated, cleared for initiation of dysphagia diet     SSS (sick sinus syndrome) (HCC) 08/10/2019   Staphylococcus aureus bacteremia 10/26/2022   Last Assessment & Plan:    Blood cultures grew MSSA.   - TTE on 12/23 negative for vegetation.   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    Foley catheter once again discontinued April 15th with bladder trials after requiring replacement April 12th.   Fortunately no evidence for urinary retention but will continue monitoring additional 24 hours.   Foley catheter was replaced April 12th with urine demonstrate 2+ leukocyte but no nitrites and urine cul   Visual disturbance of one eye 09/30/2018   Last Assessment & Plan:  Formatting of this note might be different from the original. Had retinal surgery and cataract surgery and so so results   Vitamin D deficiency 05/15/2017    Current Medications: Current Meds  Medication Sig   acetaminophen (TYLENOL) 325 MG tablet Take 1-2 tablets (325-650 mg total) by mouth every 4 (four) hours as needed for mild pain.   apixaban (ELIQUIS) 5 MG TABS tablet Take 1 tablet (5 mg total) by mouth 2 (two) times daily.   buprenorphine (BUTRANS) 10 MCG/HR PTWK  Place 1 patch onto the skin once a week.   carvedilol (COREG) 12.5 MG tablet Take 12.5 mg by mouth 2 (two) times daily with a meal.   ceFAZolin 1 g in sodium chloride 0.9 % 500 mL 1 g by Other route 2 (two) times daily.   furosemide (LASIX) 40 MG tablet Take 1 tablet (40 mg total) by mouth daily.   gabapentin (NEURONTIN) 100 MG capsule Take 100 mg by mouth 2 (two) times daily.   lidocaine (LIDODERM) 5 % Place 3 patches onto the skin daily.   ondansetron (ZOFRAN) 4 MG tablet Take 4 mg by mouth every 8 (eight) hours as needed for nausea or vomiting.   rOPINIRole (REQUIP) 2 MG tablet Take 2 mg by mouth at bedtime.       EKGs/Labs/Other Studies Reviewed:    The following studies were reviewed today:  Cardiac Studies & Procedures       ECHOCARDIOGRAM  ECHOCARDIOGRAM COMPLETE 02/15/2022  Narrative ECHOCARDIOGRAM REPORT    Patient Name:   Carmen Ford Date of Exam: 02/15/2022 Medical Rec #:  [id number]      Height:       65.0 in Accession #:    [id number]     Weight:       244.0 lb Date of Birth:  12-Jan-1946       BSA:          2.153 m Patient Age:    77 years       BP:           143/80 mmHg Patient Gender: F              HR:  Severe protein-calorie malnutrition (HCC) 11/02/2022   Last Assessment & Plan:    Progressive during hospitalization.  Related to decreased p.o. intake, severity of acute illness.  BMI 43.3.  Generalized weakness/debilitation.  Albumin 2.2.   - SLP evaluated, cleared for initiation of dysphagia diet     SSS (sick sinus syndrome) (HCC) 08/10/2019   Staphylococcus aureus bacteremia 10/26/2022   Last Assessment & Plan:    Blood cultures grew MSSA.   - TTE on 12/23 negative for vegetation.   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    Foley catheter once again discontinued April 15th with bladder trials after requiring replacement April 12th.   Fortunately no evidence for urinary retention but will continue monitoring additional 24 hours.   Foley catheter was replaced April 12th with urine demonstrate 2+ leukocyte but no nitrites and urine cul   Visual disturbance of one eye 09/30/2018   Last Assessment & Plan:  Formatting of this note might be different from the original. Had retinal surgery and cataract surgery and so so results   Vitamin D deficiency 05/15/2017    Current Medications: Current Meds  Medication Sig   acetaminophen (TYLENOL) 325 MG tablet Take 1-2 tablets (325-650 mg total) by mouth every 4 (four) hours as needed for mild pain.   apixaban (ELIQUIS) 5 MG TABS tablet Take 1 tablet (5 mg total) by mouth 2 (two) times daily.   buprenorphine (BUTRANS) 10 MCG/HR PTWK  Place 1 patch onto the skin once a week.   carvedilol (COREG) 12.5 MG tablet Take 12.5 mg by mouth 2 (two) times daily with a meal.   ceFAZolin 1 g in sodium chloride 0.9 % 500 mL 1 g by Other route 2 (two) times daily.   furosemide (LASIX) 40 MG tablet Take 1 tablet (40 mg total) by mouth daily.   gabapentin (NEURONTIN) 100 MG capsule Take 100 mg by mouth 2 (two) times daily.   lidocaine (LIDODERM) 5 % Place 3 patches onto the skin daily.   ondansetron (ZOFRAN) 4 MG tablet Take 4 mg by mouth every 8 (eight) hours as needed for nausea or vomiting.   rOPINIRole (REQUIP) 2 MG tablet Take 2 mg by mouth at bedtime.       EKGs/Labs/Other Studies Reviewed:    The following studies were reviewed today:  Cardiac Studies & Procedures       ECHOCARDIOGRAM  ECHOCARDIOGRAM COMPLETE 02/15/2022  Narrative ECHOCARDIOGRAM REPORT    Patient Name:   Carmen Ford Date of Exam: 02/15/2022 Medical Rec #:  [id number]      Height:       65.0 in Accession #:    [id number]     Weight:       244.0 lb Date of Birth:  12-Jan-1946       BSA:          2.153 m Patient Age:    77 years       BP:           143/80 mmHg Patient Gender: F              HR:  Severe protein-calorie malnutrition (HCC) 11/02/2022   Last Assessment & Plan:    Progressive during hospitalization.  Related to decreased p.o. intake, severity of acute illness.  BMI 43.3.  Generalized weakness/debilitation.  Albumin 2.2.   - SLP evaluated, cleared for initiation of dysphagia diet     SSS (sick sinus syndrome) (HCC) 08/10/2019   Staphylococcus aureus bacteremia 10/26/2022   Last Assessment & Plan:    Blood cultures grew MSSA.   - TTE on 12/23 negative for vegetation.   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    Foley catheter once again discontinued April 15th with bladder trials after requiring replacement April 12th.   Fortunately no evidence for urinary retention but will continue monitoring additional 24 hours.   Foley catheter was replaced April 12th with urine demonstrate 2+ leukocyte but no nitrites and urine cul   Visual disturbance of one eye 09/30/2018   Last Assessment & Plan:  Formatting of this note might be different from the original. Had retinal surgery and cataract surgery and so so results   Vitamin D deficiency 05/15/2017    Current Medications: Current Meds  Medication Sig   acetaminophen (TYLENOL) 325 MG tablet Take 1-2 tablets (325-650 mg total) by mouth every 4 (four) hours as needed for mild pain.   apixaban (ELIQUIS) 5 MG TABS tablet Take 1 tablet (5 mg total) by mouth 2 (two) times daily.   buprenorphine (BUTRANS) 10 MCG/HR PTWK  Place 1 patch onto the skin once a week.   carvedilol (COREG) 12.5 MG tablet Take 12.5 mg by mouth 2 (two) times daily with a meal.   ceFAZolin 1 g in sodium chloride 0.9 % 500 mL 1 g by Other route 2 (two) times daily.   furosemide (LASIX) 40 MG tablet Take 1 tablet (40 mg total) by mouth daily.   gabapentin (NEURONTIN) 100 MG capsule Take 100 mg by mouth 2 (two) times daily.   lidocaine (LIDODERM) 5 % Place 3 patches onto the skin daily.   ondansetron (ZOFRAN) 4 MG tablet Take 4 mg by mouth every 8 (eight) hours as needed for nausea or vomiting.   rOPINIRole (REQUIP) 2 MG tablet Take 2 mg by mouth at bedtime.       EKGs/Labs/Other Studies Reviewed:    The following studies were reviewed today:  Cardiac Studies & Procedures       ECHOCARDIOGRAM  ECHOCARDIOGRAM COMPLETE 02/15/2022  Narrative ECHOCARDIOGRAM REPORT    Patient Name:   Carmen Ford Date of Exam: 02/15/2022 Medical Rec #:  [id number]      Height:       65.0 in Accession #:    [id number]     Weight:       244.0 lb Date of Birth:  12-Jan-1946       BSA:          2.153 m Patient Age:    77 years       BP:           143/80 mmHg Patient Gender: F              HR:  Cardiology Office Note:    Date:  08/27/2023   ID:  Carmen Ford, DOB [birth year], MRN [id number]  PCP:  Patient, No Pcp Per  Cardiologist:  Norman Herrlich, MD    Referring MD: No ref. provider found    ASSESSMENT:    1. Coronary artery disease involving native coronary artery of native heart with angina pectoris (HCC)   2. Hypertensive heart disease with heart failure (HCC)   3. CKD (chronic kidney disease), stage IV (HCC)   4. Pacemaker   5. Complete AV block (HCC)   6. Persistent atrial fibrillation (HCC)   7. Chronic anticoagulation   8. Dyslipidemia    PLAN:    In order of problems listed above:  ***   Next appointment: ***   Medication Adjustments/Labs and Tests Ordered: Current medicines are reviewed at length with the patient today.  Concerns regarding medicines are outlined above.  No orders of the defined types were placed in this encounter.  No orders of the defined types were placed in this encounter.    History of Present Illness:    Carmen Ford is a 77 y.o. female with a hx of coronary artery disease with CABG hypertensive heart disease with chronic diastolic heart failure and stage IV CKD intermittent hyper kalemia hyperlipidemia anemia due to CKD and second-degree AV block with permanent dual-chamber pacemaker followed in device clinic last seen 05/26/2023. 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  Will administer IV Flagyl and Cipro, clear liquid diet, check stool for c diff and culture.  Will recheck lactic acid for sepsis.  Was supposed to have colonoscopy recently but has not had one for years.    Last Assessment & Plan:  CT of the abdomen obtained showed right colitis.  Tolerated clear liquid diet.  States has had no further diarrhea si   Acute metabolic  encephalopathy 10/30/2022   Last Assessment & Plan:    Improved, back to baseline.     Acute on chronic kidney failure (HCC) 05/08/2017   Last Assessment & Plan:  Patient presented to ER with diarrhea, renal panel indicated dehydration.  Reviewed past renal panels and creatinine is elevated from baseline of about 1.5 and now 2.3. Diarrhea has resolved per patient report and she is tolerating fluids, will advance to regular diabetic, cardiac diet. Plan: Resume home lasix lasix  Bun and creatine trending down Diarrhea resolved Tolerat   Acute renal failure superimposed on stage 4 chronic kidney disease (HCC) 05/08/2017   Last Assessment & Plan:  Recheck BMP and potassium and was trending favorably No change in meds until results known and is ok and aware to hold ARB and diuretic   Acute respiratory failure with hypoxia (HCC) 11/02/2022   Last Assessment & Plan:    Required several day treatment with BiPAP.  Associated with hypoxia and hypercapnia.   Status post BiPAP, respiratory status is improving,   - continue supplemental O2 and wean as tolerated   - volume removal with dialysis     Altered mental status 02/07/2023   Anemia    Anemia in chronic kidney disease 10/15/2016   B12 deficiency 09/06/2022   Back pain 04/25/2023   Bradycardia    Overview:  Not on a beta blocker with sinus bradycardia and Mobitz 1 second degree AV block

## 2023-08-27 ENCOUNTER — Ambulatory Visit: Payer: Medicare PPO | Attending: Cardiology | Admitting: Cardiology

## 2023-08-27 ENCOUNTER — Ambulatory Visit (INDEPENDENT_AMBULATORY_CARE_PROVIDER_SITE_OTHER): Payer: Medicare PPO

## 2023-08-27 DIAGNOSIS — I495 Sick sinus syndrome: Secondary | ICD-10-CM

## 2023-08-27 LAB — CUP PACEART REMOTE DEVICE CHECK
Battery Remaining Longevity: 84 mo
Battery Voltage: 2.97 V
Brady Statistic RA Percent Paced: 0 %
Brady Statistic RV Percent Paced: 99.19 %
Date Time Interrogation Session: 20241022234920
Implantable Lead Connection Status: 753985
Implantable Lead Connection Status: 753985
Implantable Lead Implant Date: 20201006
Implantable Lead Implant Date: 20201006
Implantable Lead Location: 753859
Implantable Lead Location: 753860
Implantable Lead Model: 5076
Implantable Lead Model: 5076
Implantable Pulse Generator Implant Date: 20201006
Lead Channel Impedance Value: 266 Ohm
Lead Channel Impedance Value: 342 Ohm
Lead Channel Impedance Value: 418 Ohm
Lead Channel Impedance Value: 437 Ohm
Lead Channel Pacing Threshold Amplitude: 0.5 V
Lead Channel Pacing Threshold Amplitude: 0.875 V
Lead Channel Pacing Threshold Pulse Width: 0.4 ms
Lead Channel Pacing Threshold Pulse Width: 0.4 ms
Lead Channel Sensing Intrinsic Amplitude: 1.75 mV
Lead Channel Sensing Intrinsic Amplitude: 1.75 mV
Lead Channel Sensing Intrinsic Amplitude: 7.875 mV
Lead Channel Sensing Intrinsic Amplitude: 7.875 mV
Lead Channel Setting Pacing Amplitude: 2.5 V
Lead Channel Setting Pacing Pulse Width: 0.4 ms
Lead Channel Setting Sensing Sensitivity: 1.2 mV
Zone Setting Status: 755011
Zone Setting Status: 755011

## 2023-09-16 NOTE — Progress Notes (Signed)
Remote pacemaker transmission.   

## 2023-11-08 NOTE — ED Provider Notes (Signed)
 EMERGENCY DEPARTMENT PROVIDER NOTE [facility]   Patient Name: Carmen Ford Date of Birth:  1945/12/28 MRN: [id number]  History of Present Illness  HPI  Chief Complaint  Patient presents with  . Head Injury    HPI 78 year old female with history of CHF, CAD, diabetes, ESRD on hemodialysis, hypertension, hyperlipidemia, IBS, who presents to the emergency department for evaluation of pain to the forehead after a fall.  She reports that she was going to the bathroom when she tripped, fell forward, and struck her head on the edge of a dresser.  She has had bleeding from the left eyelid and swelling of the forehead since the fall that occurred just prior to arrival.  There was no loss of consciousness or preceding lightheadedness, dizziness, chest pain, or shortness of breath.  She has chronic low back and hip pain related to chronic degenerative changes as well as history of osteomyelitis over the summer for which she is completing antibiotics in the next couple of days.  This pain is chronic and no change.  Her only new pain is in the left forehead.  She is on Eliquis .  Patient and family report follow-up with her ID provider next week.  They are requesting x-ray of her back to assess for chronic changes such as compression fracture though she denies any acute pain in this area.    Medical and Social History  Patient History Past Medical History:  Diagnosis Date  . Anemia   . Arrhythmia   . CHF (congestive heart failure) (CMS/HCC) (HCC)   . Chronic ischemic heart disease   . Chronic kidney disease   . Coronary artery disease   . Diabetes mellitus (CMS/HCC) (HCC)   . Hemodialysis patient (CMS/HCC) (HCC)   . Hyperlipidemia   . Hypertension   . IBS (irritable bowel syndrome)   . Mental disorder   . MRSA (methicillin resistant Staphylococcus aureus)   . Myocardial infarction (CMS/HCC) (HCC)   . Neuropathy   . Osteomyelitis of vertebra of thoracolumbar  region (CMS/HCC) (HCC) 04/2023  . Pacemaker   . Restless leg syndrome   . RLS (restless legs syndrome)   . Vitamin D  deficiency    Past Surgical History:  Procedure Laterality Date  . CARDIAC SURGERY  2017   PACEMAKER  . CHG ANGIOGRAPHY EXTREMITY UNILATERAL RS&I Left 10/08/2023   Procedure: 1. US  guided access to Right common femoral artery 2. Aortogram , left lower extremity angiogram 3. Catheter in left posterior tibial artery 4. angioplasty in left posterior tibial artery and left peritoneal artery 5. starclose to right groin;  Surgeon: Reggy Rhein, MD;  Location: [facility] OR;  Service: Vascular  . CHOLECYSTECTOMY    . CORONARY ARTERY BYPASS GRAFT  2011  . EYE SURGERY Bilateral    March and May. Cataract removal.  . IR GENERAL PROCEDURE N/A 10/08/2023  . IR NON-TUNNELED CENTRAL LINE N/A 11/12/2022  . IR TUNNELED CATH REMOVAL N/A 01/16/2023  . IR TUNNELED CENTRAL LINE N/A 11/20/2022  . JOINT REPLACEMENT Left    KNEE  . KNEE SURGERY Left 2018  . LEG SURGERY     Family History  Problem Relation Age of Onset  . Diabetes Mother   . Heart failure Mother        UNK  . Heart failure Father        UNK  . Cancer Brother   . Hypertension Son    Social History   Tobacco Use  . Smoking status: Never  .  Smokeless tobacco: Never  Substance Use Topics  . Alcohol  use: No  . Drug use: No      Review of Systems  Review of Systems Review of Systems  All other systems reviewed and are negative.     Physical Exam  Physical Exam ED Triage Vitals [11/08/23 0923]  Temperature Heart Rate Resp BP SpO2  36.7 C (98 F) 64 20 (!) 143/69 98 %    Temp Source Heart Rate Source Patient Position BP Location FiO2 (%)  Oral Monitor -- Left arm --   Physical Exam Constitutional:      General: She is not in acute distress. HENT:     Head:     Comments: Skin tear involving the left upper eyelid between the eyebrow and lid itself.  Slow oozing of blood controllable with direct  pressure. Eyes:     Pupils: Pupils are equal, round, and reactive to light.  Neck:     Trachea: No tracheal deviation.  Cardiovascular:     Rate and Rhythm: Normal rate and regular rhythm.  Pulmonary:     Effort: Pulmonary effort is normal.  Chest:     Chest wall: No deformity.  Abdominal:     Palpations: Abdomen is soft.  Musculoskeletal:     Comments: No midline spinal tenderness palpation, step-offs, deformities.  No reproducible discomfort through the lower back or hip region.  There are skin tears to both upper extremities that are hemostatic.  Skin:    General: Skin is warm and dry.     Capillary Refill: Capillary refill takes less than 2 seconds.  Neurological:     General: No focal deficit present.        ED Course and Medical Decision Making   Medical Decision Making 78 year old female presenting for evaluation of a forehead injury after a fall on anticoagulation.  She is nontoxic appearing with vitals unremarkable.  Exam with intact extraocular eye movement reassuring against orbital complications such as fracture or ocular injury.  Plan for repair of the upper eyelid with debridement of the skin flap and possible closure if any residual tissue is available to close otherwise will work on hemostasis and wound care.  No significant concern for acute fracture in the extremities or low back without pain in these areas.  I discussed limitations of x-ray imaging of the lumbar spine as family is requesting that after shared decision-making, will proceed in they will follow-up with their regular doctor as scheduled next week.  Amount and/or Complexity of Data Reviewed Radiology: ordered.  Risk Prescription drug management.    ED Course as of 11/08/23 1040  Leonor RAMAN [facility] Documentation  Dju Nov 08, 2023  9045 CT on my interpretation with a large forehead hematoma, no underlying acute intracranial pathology.  1010 CT Head Without Contrast IMPRESSION   No acute  intracranial findings.   Left frontal scalp and periorbital soft tissue hematoma.   1036 X-Ray Lumbar Spine 2 or 3 Views FINDINGS: 3 views of the lumbar spine demonstrate no acute fracture or dislocation.. Degenerative disc disease throughout with disc space narrowing, endplate sclerosis and marginal osteophyte formation..   IMPRESSION   No acute fracture or subluxation       Clinical Impressions as of 11/08/23 1040  Fall, initial encounter  Head injury, initial encounter  Skin tear of forearm without complication  Left eyelid laceration, initial encounter    ED Disposition  Discharge   ED Discharge Medications   ED Prescriptions   None  Only medications prescribed or modified are during this ED encounter are included in this list.    Leonor GORMAN [place], MD 11/08/23 1040

## 2023-11-26 ENCOUNTER — Ambulatory Visit (INDEPENDENT_AMBULATORY_CARE_PROVIDER_SITE_OTHER): Payer: Medicare PPO

## 2023-11-26 DIAGNOSIS — I495 Sick sinus syndrome: Secondary | ICD-10-CM

## 2023-11-26 LAB — CUP PACEART REMOTE DEVICE CHECK
Battery Remaining Longevity: 76 mo
Battery Voltage: 2.97 V
Brady Statistic RA Percent Paced: 0 %
Brady Statistic RV Percent Paced: 99.22 %
Date Time Interrogation Session: 20250121223631
Implantable Lead Connection Status: 753985
Implantable Lead Connection Status: 753985
Implantable Lead Implant Date: 20201006
Implantable Lead Implant Date: 20201006
Implantable Lead Location: 753859
Implantable Lead Location: 753860
Implantable Lead Model: 5076
Implantable Lead Model: 5076
Implantable Pulse Generator Implant Date: 20201006
Lead Channel Impedance Value: 285 Ohm
Lead Channel Impedance Value: 361 Ohm
Lead Channel Impedance Value: 418 Ohm
Lead Channel Impedance Value: 418 Ohm
Lead Channel Pacing Threshold Amplitude: 0.5 V
Lead Channel Pacing Threshold Amplitude: 0.625 V
Lead Channel Pacing Threshold Pulse Width: 0.4 ms
Lead Channel Pacing Threshold Pulse Width: 0.4 ms
Lead Channel Sensing Intrinsic Amplitude: 1.75 mV
Lead Channel Sensing Intrinsic Amplitude: 1.75 mV
Lead Channel Sensing Intrinsic Amplitude: 8 mV
Lead Channel Sensing Intrinsic Amplitude: 8 mV
Lead Channel Setting Pacing Amplitude: 2.5 V
Lead Channel Setting Pacing Pulse Width: 0.4 ms
Lead Channel Setting Sensing Sensitivity: 1.2 mV
Zone Setting Status: 755011
Zone Setting Status: 755011

## 2023-12-30 ENCOUNTER — Telehealth: Payer: Self-pay | Admitting: Cardiology

## 2023-12-30 NOTE — Telephone Encounter (Signed)
 Pt c/o medication issue:  1. Name of Medication: apixaban (ELIQUIS) 5 MG TABS tablet   2. How are you currently taking this medication (dosage and times per day)?    3. Are you having a reaction (difficulty breathing--STAT)? no  4. What is your medication issue? Daughter states has been the ER and the dr wants to stop patient Eliquis to give her a type shot that she will be on for 3 months. Calling to see if that something should happen. Please advise

## 2023-12-31 NOTE — Telephone Encounter (Signed)
 Attempted to reach dtr, voicemail full, unable to leave a message.

## 2023-12-31 NOTE — Telephone Encounter (Addendum)
 Spoke to dtr, pt fell a few weeks ago. Last Thursday pt left hand started swelling/bruising, dtr found out on Sunday. Unsure how it was injured. Yesterday pt went to the dermatologist who told pt to go to ED for evaluation.   Dtr says hospital says she has clots in her hand and they stopped Eliquis and started pt on Lovenox for 3 months. Aware I will have MD review RH notes next week. Advised to see PCP about the blood clot and any follow up testing that may be needed in several months. Patient verbalized understanding and agreeable to plan.

## 2024-01-09 NOTE — Progress Notes (Signed)
 Remote pacemaker transmission.

## 2024-02-06 NOTE — Telephone Encounter (Signed)
 Pt understands to let office know once she is taking Eliquis again.  She is agreeable to plan.

## 2024-02-25 ENCOUNTER — Ambulatory Visit (INDEPENDENT_AMBULATORY_CARE_PROVIDER_SITE_OTHER): Payer: Medicare PPO

## 2024-02-25 DIAGNOSIS — I495 Sick sinus syndrome: Secondary | ICD-10-CM | POA: Diagnosis not present

## 2024-02-26 LAB — CUP PACEART REMOTE DEVICE CHECK
Battery Remaining Longevity: 74 mo
Battery Voltage: 2.97 V
Brady Statistic RA Percent Paced: 0 %
Brady Statistic RV Percent Paced: 99.78 %
Date Time Interrogation Session: 20250422230911
Implantable Lead Connection Status: 753985
Implantable Lead Connection Status: 753985
Implantable Lead Implant Date: 20201006
Implantable Lead Implant Date: 20201006
Implantable Lead Location: 753859
Implantable Lead Location: 753860
Implantable Lead Model: 5076
Implantable Lead Model: 5076
Implantable Pulse Generator Implant Date: 20201006
Lead Channel Impedance Value: 285 Ohm
Lead Channel Impedance Value: 380 Ohm
Lead Channel Impedance Value: 418 Ohm
Lead Channel Impedance Value: 437 Ohm
Lead Channel Pacing Threshold Amplitude: 0.5 V
Lead Channel Pacing Threshold Amplitude: 0.625 V
Lead Channel Pacing Threshold Pulse Width: 0.4 ms
Lead Channel Pacing Threshold Pulse Width: 0.4 ms
Lead Channel Sensing Intrinsic Amplitude: 1.625 mV
Lead Channel Sensing Intrinsic Amplitude: 1.625 mV
Lead Channel Sensing Intrinsic Amplitude: 8.5 mV
Lead Channel Sensing Intrinsic Amplitude: 8.5 mV
Lead Channel Setting Pacing Amplitude: 2.5 V
Lead Channel Setting Pacing Pulse Width: 0.4 ms
Lead Channel Setting Sensing Sensitivity: 1.2 mV
Zone Setting Status: 755011
Zone Setting Status: 755011

## 2024-03-08 ENCOUNTER — Encounter: Admitting: Student

## 2024-03-10 DIAGNOSIS — S91002D Unspecified open wound, left ankle, subsequent encounter: Secondary | ICD-10-CM | POA: Insufficient documentation

## 2024-03-11 ENCOUNTER — Telehealth: Payer: Self-pay | Admitting: Cardiology

## 2024-03-11 NOTE — Telephone Encounter (Signed)
 Device clearance form completed and faxed

## 2024-03-11 NOTE — Telephone Encounter (Signed)
 Pt called in asking if we have received device clearance for her to have MRI. Informed her, we did received in onbase and that I would let device clinic and physician know to fill and fax back.

## 2024-04-08 NOTE — Addendum Note (Signed)
 Addended by: Lott Rouleau A on: 04/08/2024 11:25 AM   Modules accepted: Orders

## 2024-04-08 NOTE — Progress Notes (Signed)
 Remote pacemaker transmission.

## 2024-05-26 ENCOUNTER — Ambulatory Visit: Payer: Medicare PPO

## 2024-05-26 DIAGNOSIS — I495 Sick sinus syndrome: Secondary | ICD-10-CM | POA: Diagnosis not present

## 2024-05-27 ENCOUNTER — Telehealth: Payer: Self-pay

## 2024-05-27 LAB — CUP PACEART REMOTE DEVICE CHECK
Battery Remaining Longevity: 72 mo
Battery Voltage: 2.97 V
Brady Statistic AP VP Percent: 0 %
Brady Statistic AP VS Percent: 0 %
Brady Statistic AS VP Percent: 99.35 %
Brady Statistic AS VS Percent: 0.59 %
Brady Statistic RA Percent Paced: 0 %
Brady Statistic RV Percent Paced: 99.59 %
Date Time Interrogation Session: 20250723004128
Implantable Lead Connection Status: 753985
Implantable Lead Connection Status: 753985
Implantable Lead Implant Date: 20201006
Implantable Lead Implant Date: 20201006
Implantable Lead Location: 753859
Implantable Lead Location: 753860
Implantable Lead Model: 5076
Implantable Lead Model: 5076
Implantable Pulse Generator Implant Date: 20201006
Lead Channel Impedance Value: 266 Ohm
Lead Channel Impedance Value: 361 Ohm
Lead Channel Impedance Value: 418 Ohm
Lead Channel Impedance Value: 418 Ohm
Lead Channel Pacing Threshold Amplitude: 0.5 V
Lead Channel Pacing Threshold Amplitude: 0.625 V
Lead Channel Pacing Threshold Pulse Width: 0.4 ms
Lead Channel Pacing Threshold Pulse Width: 0.4 ms
Lead Channel Sensing Intrinsic Amplitude: 2.75 mV
Lead Channel Sensing Intrinsic Amplitude: 2.75 mV
Lead Channel Sensing Intrinsic Amplitude: 6.25 mV
Lead Channel Sensing Intrinsic Amplitude: 6.25 mV
Lead Channel Setting Pacing Amplitude: 2.5 V
Lead Channel Setting Pacing Pulse Width: 0.4 ms
Lead Channel Setting Sensing Sensitivity: 1.2 mV
Zone Setting Status: 755011
Zone Setting Status: 755011

## 2024-05-27 NOTE — Telephone Encounter (Signed)
 PPM scheduled remote reviewed. Normal device function.  Presenting rhythm: AS-VP  (programmed VVIR) Known hx of 100% AF. On OAC per chart.  _________________________________________________________________  [place] and spoke with patient's daughter regarding recent conversion from AF to AV disassociation. Currently programmed VVIR. After speaking with Daphne Barrack, NP, recommend bringing patient into device clinic to reprogram patient to DDDR. Patient scheduled for appointment 7/25 @ 9 am. Device clinic number given for any questions or concerns.

## 2024-05-28 ENCOUNTER — Ambulatory Visit: Attending: Internal Medicine

## 2024-05-28 DIAGNOSIS — I495 Sick sinus syndrome: Secondary | ICD-10-CM

## 2024-05-28 NOTE — Progress Notes (Signed)
 Patient brought into device clinic today for reprogramming per Daphne Barrack, NP. Patient previously in AF and converted to AV dyssynchrony. Patient complaint of fatigue. Educated on AF and symptoms associated.  Programming changed from VVIR to DDDR.   Patient and patient daughter appreciative for call and visit. Informed to call the device clinic for any further questions or concerns.

## 2024-05-31 ENCOUNTER — Ambulatory Visit: Payer: Self-pay | Admitting: Cardiology

## 2024-06-13 NOTE — Progress Notes (Unsigned)
  Electrophysiology Office Note:   Date:  06/14/2024  ID:  Carmen Ford, DOB 1946/05/05, MRN [id number]  Primary Cardiologist: None Primary Heart Failure: None Electrophysiologist: Abbi Mancini Gladis Norton, MD      History of Present Illness:   Carmen Ford is a 78 y.o. female with h/o atrial fibrillation, diastolic heart failure, coronary disease, hypertension, hyperlipidemia, CKD stage III, episodic AV block seen today for routine electrophysiology followup.   Since last being seen in our clinic the patient reports doing overall doing well.  She has no chest pain or shortness of breath.  She does have some fatigue which is a chronic issue for her.  She has converted to normal rhythm.  She is feeling less fatigued and sinus rhythm.  she denies chest pain, palpitations, dyspnea, PND, orthopnea, nausea, vomiting, dizziness, syncope, edema, weight gain, or early satiety.   Review of systems complete and found to be negative unless listed in HPI.      EP Information / Studies Reviewed:    EKG is ordered today. Personal review as below.  EKG Interpretation Date/Time:  Monday June 14 2024 09:51:50 EDT Ventricular Rate:  62 PR Interval:  178 QRS Duration:  164 QT Interval:  522 QTC Calculation: 529 R Axis:   -76  Text Interpretation: AV dual-paced rhythm When compared with ECG of 11-Aug-2019 00:56, VENTRICULAR PACING Confirmed by Gisella Alwine ([id number]) on 06/14/2024 10:03:40 AM   PPM Interrogation-  reviewed in detail today,  See PACEART report.  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 The patient's score is based upon:        Physical Exam:   VS:  BP (!) 124/58   Pulse 62   Ht 5' 5 (1.651 m)   Wt 186 lb (84.4 kg)   SpO2 96%   BMI 30.95 kg/m    Wt Readings from Last 3 Encounters:  06/14/24 186 lb (84.4 kg)  05/26/23 219 lb (99.3  kg)  03/03/23 205 lb (93 kg)     GEN: Well nourished, well developed in no acute distress NECK: No JVD; No carotid bruits CARDIAC: Regular rate and rhythm, no murmurs, rubs, gallops RESPIRATORY:  Clear to auscultation without rales, wheezing or rhonchi  ABDOMEN: Soft, non-tender, non-distended EXTREMITIES:  No edema; No deformity   ASSESSMENT AND PLAN:    CHB s/p Medtronic PPM  Normal PPM function See Pace Art report Sensing threshold impedance within normal limits Programming appropriate No changes today.  2.  Persistent atrial fibrillation: She is converted back to sinus rhythm.  She feels a little bit less fatigued.  Carmen Ford continue with current management.  She does not wish to start antiarrhythmics at this time.  3.  Second hypercoagulable state: On Eliquis   Disposition:   Follow up with Dr. Norton 2 years   Signed, Brandyn Lowrey Gladis Norton, MD

## 2024-06-14 ENCOUNTER — Ambulatory Visit: Attending: Cardiology | Admitting: Cardiology

## 2024-06-14 ENCOUNTER — Encounter: Payer: Self-pay | Admitting: Cardiology

## 2024-06-14 ENCOUNTER — Ambulatory Visit: Payer: Self-pay | Admitting: Cardiology

## 2024-06-14 VITALS — BP 124/58 | HR 62 | Ht 65.0 in | Wt 186.0 lb

## 2024-06-14 DIAGNOSIS — I4819 Other persistent atrial fibrillation: Secondary | ICD-10-CM

## 2024-06-14 DIAGNOSIS — I442 Atrioventricular block, complete: Secondary | ICD-10-CM

## 2024-06-14 DIAGNOSIS — D6869 Other thrombophilia: Secondary | ICD-10-CM | POA: Diagnosis not present

## 2024-06-14 DIAGNOSIS — I495 Sick sinus syndrome: Secondary | ICD-10-CM

## 2024-06-14 LAB — CUP PACEART INCLINIC DEVICE CHECK
Battery Remaining Longevity: 63 mo
Battery Voltage: 2.96 V
Brady Statistic AP VP Percent: 99.87 %
Brady Statistic AP VS Percent: 0 %
Brady Statistic AS VP Percent: 0.12 %
Brady Statistic AS VS Percent: 0.01 %
Brady Statistic RA Percent Paced: 77.74 %
Brady Statistic RV Percent Paced: 99.82 %
Date Time Interrogation Session: 20250811104151
Implantable Lead Connection Status: 753985
Implantable Lead Connection Status: 753985
Implantable Lead Implant Date: 20201006
Implantable Lead Implant Date: 20201006
Implantable Lead Location: 753859
Implantable Lead Location: 753860
Implantable Lead Model: 5076
Implantable Lead Model: 5076
Implantable Pulse Generator Implant Date: 20201006
Lead Channel Impedance Value: 285 Ohm
Lead Channel Impedance Value: 361 Ohm
Lead Channel Impedance Value: 418 Ohm
Lead Channel Impedance Value: 437 Ohm
Lead Channel Pacing Threshold Amplitude: 0.5 V
Lead Channel Pacing Threshold Amplitude: 0.625 V
Lead Channel Pacing Threshold Amplitude: 0.75 V
Lead Channel Pacing Threshold Amplitude: 0.75 V
Lead Channel Pacing Threshold Pulse Width: 0.4 ms
Lead Channel Pacing Threshold Pulse Width: 0.4 ms
Lead Channel Pacing Threshold Pulse Width: 0.4 ms
Lead Channel Pacing Threshold Pulse Width: 0.4 ms
Lead Channel Sensing Intrinsic Amplitude: 2.375 mV
Lead Channel Sensing Intrinsic Amplitude: 2.375 mV
Lead Channel Sensing Intrinsic Amplitude: 7.5 mV
Lead Channel Sensing Intrinsic Amplitude: 7.5 mV
Lead Channel Setting Pacing Amplitude: 1.5 V
Lead Channel Setting Pacing Amplitude: 2.5 V
Lead Channel Setting Pacing Pulse Width: 0.4 ms
Lead Channel Setting Sensing Sensitivity: 1.2 mV
Zone Setting Status: 755011
Zone Setting Status: 755011

## 2024-06-14 NOTE — Patient Instructions (Addendum)
 Medication Instructions:  Your physician recommends that you continue on your current medications as directed. Please refer to the Current Medication list given to you today.  *If you need a refill on your cardiac medications before your next appointment, please call your pharmacy*  Lab Work: None ordered.  You may go to any [facility] Location for your lab work:  [facility] - [address] ([facility]) - [address] [phone] [address]  [place] - [address]   [place]  - [address]   [place] - [address] - [address] [employer]  - [address] ([facility]   If you have labs (blood work) drawn today and your tests are completely normal, you will receive your results only by: [employer] (if you have MyChart)  If you have any lab test that is abnormal or we need to change your treatment, we will call you or send a MyChart message to review the results.  Testing/Procedures: None ordered.  Follow-Up: At [facility], you and your health needs are our priority.  As part of our continuing mission to provide you with exceptional heart care, we have created designated Provider Care Teams.  These Care Teams include your primary Cardiologist (physician) and Advanced Practice Providers (APPs -  Physician Assistants and Nurse Practitioners) who all work together to provide you with the care you need, when you need it.  Your next appointment:   2 year(s)  The format for your next appointment:   In Person  Provider:   Soyla Norton, MD or one of the following Advanced Practice Providers on your designated Care Team:   Charlies Arthur, PA-C Michael Andy Tillery, PA-C Brandy Ollis, NP  Note: Remote monitoring is used to monitor your Pacemaker/ ICD from home. This monitoring reduces the number of office visits required to check your  device to one time per year. It allows us  to keep an eye on the functioning of your device to ensure it is working properly.

## 2024-08-06 NOTE — Progress Notes (Signed)
 Remote PPM Transmission

## 2024-08-12 NOTE — Progress Notes (Unsigned)
 Cardiology Office Note:    Date:  08/12/2024   ID:  Carmen Ford, DOB [birth year], MRN [id number]  PCP:  Carmen Ford., Carmen Ford  Cardiologist:  Redell Leiter, MD    Referring MD: Carmen Ford.,*    ASSESSMENT:    No diagnosis found. PLAN:    In order of problems listed above:  ***   Next appointment: ***   Medication Adjustments/Labs and Tests Ordered: Current medicines are reviewed at length with the patient today.  Concerns regarding medicines are outlined above.  No orders of the defined types were placed in this encounter.  No orders of the defined types were placed in this encounter.    History of Present Illness:    Carmen Ford is a 78 y.o. 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  Recent labs 05/06/2024 hemoglobin 9.3 platelets 173,000 sodium 131 potassium 4.5 creatinine 1.37 GFR 40 cc/min BNP level 342 lipid profile cholesterol 145 LDL 99 non-HDL cholesterol 122  Recent device check showed an atrial fibrillation burden of 22.6% predominantly atrially and ventricularly paced Compliance with diet, lifestyle and medications: *** Past Medical History:  Diagnosis Date   Acquired cavovarus deformity of left foot 12/24/2017   Acute colitis 05/08/2017   Overview:  CT of the abdomen shows right colitis.  Will administer IV Flagyl and Cipro, clear liquid diet, check stool for c diff and culture.  Will recheck lactic acid for sepsis.  Was supposed to have colonoscopy recently but has not had one for years.    Last Assessment & Plan:  CT of the abdomen obtained showed right colitis.  Tolerated clear liquid diet.  States has had no further diarrhea si   Acute metabolic encephalopathy 10/30/2022   Last Assessment & Plan:    Improved, back to  baseline.     Acute on chronic kidney failure 05/08/2017   Last Assessment & Plan:  Patient presented to ER with diarrhea, renal panel indicated dehydration.  Reviewed past renal panels and creatinine is elevated from baseline of about 1.5 and now 2.3. Diarrhea has resolved per patient report and she is tolerating fluids, will advance to regular diabetic, cardiac diet. Plan: Resume home lasix  lasix   Bun and creatine trending down Diarrhea resolved Tolerat   Acute renal failure superimposed on stage 4 chronic kidney disease (HCC) 05/08/2017   Last Assessment & Plan:  Recheck BMP and potassium and was trending favorably No change in meds until results known and is ok and aware to hold ARB and diuretic   Acute respiratory failure with hypoxia (HCC) 11/02/2022   Last Assessment & Plan:    Required several day treatment with BiPAP.  Associated with hypoxia and hypercapnia.   Status post BiPAP, respiratory status is improving,   - continue supplemental O2 and wean as tolerated   - volume removal with dialysis     Altered mental status 02/07/2023   Anemia    Anemia in chronic kidney disease 10/15/2016   Ankle wound, left, subsequent encounter 03/10/2024   B12 deficiency 09/06/2022   Back pain 04/25/2023   Bradycardia    Overview:  Not on a beta blocker with sinus bradycardia and Mobitz 1 second degree AV block   Bradycardia by electrocardiography 09/30/2018   Last Assessment & Plan:  Symptomatic and not paced  and got well enough to d/c--will see how it does going forward  Last Assessment & Plan:  Formatting of this note might be different from the original. To get paced--   CHF (congestive heart failure) (HCC) 02/26/2016   Last Assessment & Plan:  Formatting of this note might be different from the original. Sees cardio and will do BNP today   Chronic diastolic heart failure (HCC) 02/26/2016   Overview:  Overview:  EF normal by echo 2013   Chronic ischemic heart disease    Chronic kidney  disease, stage 3b (HCC) 03/04/2023   Chronic use of opiate for therapeutic purpose 04/16/2023   Last Assessment & Plan:    Patient and I have discussed the hazardous effects of continued opiate pain medication usage. Risks and benefits of above medications including but not limited to possibility of hyperalgesia, respiratory depression, sedation, and even death were discussed with the patient who expressed an understanding. Patient is willing to assume these health risks as mentioned above    CKD (chronic kidney disease) 05/15/2017   Colitis, acute    Constipation 02/13/2023   Last Assessment & Plan:    Improved.   Continue schedule senna, MiraLax  with newly added p.r.n. Colace.   Last bowel movement reported as medium formed on the evening of April 15th.     Coronary artery disease    CABG  2011: LTA to LAD, SVG to M, SVG to PDA EF 40%   Coronary artery disease involving native coronary artery of native heart with angina pectoris 02/26/2016   Cyst of pancreas 01/17/2023   Last Assessment & Plan:    Noted on imaging, check MRI pancreas and noted stable   Recommended to have follow up imaging in 6-12 months     Degenerative arthritis of left knee 06/09/2017   Diabetes mellitus without complication (HCC)    type II    Diabetic macular edema (HCC) 06/23/2023   Diabetic peripheral neuropathy (HCC) 12/14/2018   Last Assessment & Plan:  Sees endo They adjust her insulin   Last Assessment & Plan:  Formatting of this note might be different from the original. Sees endo   Discitis of thoracolumbar region 01/11/2023   Last Assessment & Plan:    Remote history of thoracolumbar diskitis at L1-2 and L5-S1 with possible infectious diskitis at T12-L1 and L2-L3.  She will continue to follow with ID and PCP for management.  Continue medications as previously outlined.  See right psoas abscess plan for additional     Disorder of peroneal tendon 12/24/2017   Elevated TSH 02/07/2023   Last Assessment & Plan:     Patient with mildly elevated free T4.  Will recheck TSH and free T4 in 4 weeks.  Suspect had these abnormal levels of TSH and free T4 may be related to acute illness     Encounter for routine adult physical exam with abnormal findings 01/13/2017   Last Assessment & Plan:  Formatting of this note might be different from the original. Declines MMG Needs labs I have nothing and she does remember the A1C was good  No change meds and reconciled and cleaned up the problem list  6 months f/u   Enterococcus UTI 10/25/2022   Last Assessment & Plan:    Present on admission.   Resolved.   Secondary to Enterococcus faecium.   Patient completed vancomycin  April 12.     Essential (primary) hypertension 05/15/2017   Essential hypertension 05/15/2017   Last Assessment & Plan:  Do not restart ARB. Avoid beta-blockers, diltiazem, verapamil.  Start amlodipine  today  Last Assessment & Plan:  Formatting of this note might be different from the original. Doing well and no changes in treatment   Fatigue 10/15/2016   Fluid overload 11/02/2022   Last Assessment & Plan:    Improving. Progressive during hospitalization with significant peripheral edema.    - continue Lasix  80 mg p.o. daily   - fluid removal with dialysis     Hammer toe 12/24/2017   History of atrial fibrillation 02/07/2023   Last Assessment & Plan:    Cardiovascular remains regular on examination.   Continue carvedilol  12.5 mg twice daily as well as anticoagulation with apixaban  2.5 mg twice daily.   Patient had continued to remain ventricularly paced by telemetry therefore telemetry was discontinued April 14th.     History of total knee arthroplasty 06/30/2018   Hx of CABG 02/26/2016   Overview:  2011, LTA to LAD, SVG to M, SVG to PDA EF 40%  Formatting of this note might be different from the original. 2011, LTA to LAD, SVG to M, SVG to PDA EF 40%   Hyperkalemia 09/30/2018   Last Assessment & Plan:  Mild.  Improved.  Continue to follow.  May  have been contributing to some of her rhythm disturbances, but I agree likely not the primary cause.   Hyperlipidemia 05/15/2017   Hyperlipidemia associated with type 2 diabetes mellitus (HCC) 05/15/2017   Last Assessment & Plan:   Check and notify  Want LDL as low as possible     Last Assessment & Plan:   Formatting of this note might be different from the original.  Statin intolerant  Could use the PSk9--cardio is in charge of that   Is statin alllergic  Takes Omega 3 for what it is worth     Hypertensive heart disease with heart failure (HCC)    Hypomagnesemia 05/09/2017   Last Assessment & Plan:  Magnesium level 1.7 likely secondary to diarrhea loss Plan: Replaced    Hyponatremia 10/26/2022   Last Assessment & Plan:    - sodium 129 -> 131, continue to monitor     Infection of lumbar spine (HCC) 02/11/2023   Last Assessment & Plan:    Minimal pain now.  Residual pain had been radicular in nature.  Plan additional 1-2 months oral doxycycline.  Follow-up on May 2nd with Dr. Hosie, already scheduled.     Lung nodule 04/25/2023   Metatarsalgia 12/24/2017   Morbid obesity (HCC) 04/20/2019   Formatting of this note might be different from the original. 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  Will increase to 10 mcg 1 application every 7 days.  Will send in RX and follow-up as scheduled.  She will continue with prn breatk through medications as ordered     Quadriparesis [facility]) 10/30/2022   Last Assessment  & Plan:    Secondary to advanced age, multiple comorbidities, severity of acute illness inactivity    - PT evaluated patient, she  needs SNF placement at discharge     Restless legs syndrome 05/15/2017   Rupture of left patellar tendon 07/03/2017   Secondary hypercoagulable state 06/23/2023   Sepsis due to Staphylococcus aureus with acute renal failure (HCC) 10/26/2022   Last Assessment & Plan:    Present on admission. Secondary to OSSA bacteremia and MRI concerning for multilevel intervertebral disc edema throughout the thoracolumbar spine, nonspecific. Psoas abscess seen as well.  Sepsis now resolved.   Management outlined under appropriate problem-based listings.     Severe protein-calorie malnutrition 11/02/2022   Last Assessment & Plan:    Progressive during hospitalization.  Related to decreased p.o. intake, severity of acute illness.  BMI 43.3.  Generalized weakness/debilitation.  Albumin 2.2.   - SLP evaluated, cleared for initiation of dysphagia diet     SSS (sick sinus syndrome) (HCC) 08/10/2019   Staphylococcus aureus bacteremia 10/26/2022   Last Assessment & Plan:    Blood cultures grew MSSA.   - TTE on 12/23 negative for vegetation.   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    Foley catheter once again discontinued April 15th with bladder trials after requiring replacement April 12th.   Fortunately no evidence for urinary retention but will continue monitoring additional 24 hours.   Foley catheter was replaced April 12th with urine demonstrate 2+ leukocyte but no nitrites and urine cul   Visual disturbance of one eye 09/30/2018   Last Assessment & Plan:   Formatting of this note might be different from the original. Had retinal surgery and cataract surgery and so so results   Vitamin D  deficiency 05/15/2017    Current Medications: No outpatient medications have been marked as taking for the 08/13/24 encounter (Appointment) with Monetta Redell PARAS, MD.      EKGs/Labs/Other Studies Reviewed:    The following studies were reviewed today:  Cardiac Studies & Procedures   ______________________________________________________________________________________________     ECHOCARDIOGRAM  ECHOCARDIOGRAM COMPLETE 02/15/2022  Narrative ECHOCARDIOGRAM REPORT    Patient Name:   PAQUITA PRINTY Date of Exam: 02/15/2022 Medical Rec #:  [id number]      Height:       65.0 in Accession #:    [id number]     Weight:       244.0 lb Date of Birth:  1946/06/05       BSA:          2.153 m Patient Age:    75 years       BP:           143/80 mmHg Patient Gender: F              HR:           63 bpm. Exam Location:  [facility]  Procedure: 2D Echo, Cardiac Doppler, Color Doppler and Strain Analysis  Indications:    Chronic diastolic heart failure (HCC) [I50.32 (ICD-10-CM)]; Chronic ischemic heart disease [I25.9 (ICD-10-CM)]; Persistent atrial fibrillation (HCC) [I48.19 (ICD-10-CM)]  History:        Patient has no prior history of Echocardiogram examinations. CHF, CAD and Previous Myocardial Infarction, Prior CABG, Arrythmias:Atrial Fibrillation; Risk Factors:Hypertension.  Sonographer:    Charlie Jointer RDCS Referring Phys: [id number] WILL MARTIN CAMNITZ  IMPRESSIONS   1. Left ventricular ejection fraction, by estimation, is 45 to 50%. The left ventricle has mildly decreased function. Left ventricular endocardial border not optimally defined to evaluate regional wall motion. The left ventricular internal cavity size was moderately dilated. There is moderate left ventricular hypertrophy. Left ventricular diastolic parameters are consistent with Grade I  diastolic dysfunction (impaired relaxation). 2. Right ventricular systolic function was not well visualized. There is normal pulmonary artery systolic pressure. 3. On M mode. Left atrial size was moderately dilated. 4. The mitral valve is degenerative. No evidence of mitral valve regurgitation. No evidence of mitral stenosis. 5. The aortic valve is normal in structure. Aortic valve regurgitation is not visualized. No aortic stenosis is present. 6. The inferior vena cava is dilated in size with <50% respiratory variability, suggesting right atrial pressure of 15 mmHg.  FINDINGS Left Ventricle: Left ventricular ejection fraction, by estimation, is 45 to 50%. The left ventricle has mildly decreased function. Left ventricular endocardial border not optimally defined to evaluate regional wall motion. The left ventricular internal cavity size was moderately dilated. There is moderate left ventricular hypertrophy. Left ventricular diastolic parameters are consistent with Grade I diastolic dysfunction (impaired relaxation). Indeterminate filling pressures.  Right Ventricle: RV appears normal on limited [place] view. Right ventricular systolic function was not well visualized. There is normal pulmonary artery systolic pressure. The tricuspid regurgitant velocity is 2.42 m/s, and with an assumed right atrial pressure of 8 mmHg, the estimated right ventricular systolic pressure is 31.4 mmHg.  Left Atrium: On M mode. Left atrial size was moderately dilated.  Right Atrium: Right atrial size was normal in size.  Pericardium: There is no evidence of pericardial effusion.  Mitral Valve: The mitral valve is degenerative in appearance. Mild mitral annular calcification. No evidence of mitral valve regurgitation. No evidence of mitral valve stenosis.  Tricuspid Valve: The tricuspid valve is normal in structure. Tricuspid valve regurgitation is trivial. No evidence of tricuspid stenosis.  Aortic Valve: The aortic  valve is normal in structure. Aortic valve regurgitation is not visualized. No aortic stenosis is present.  Pulmonic Valve: The pulmonic valve was not well visualized. Pulmonic valve regurgitation is not visualized. No evidence of pulmonic stenosis.  Aorta: The aortic arch was not well visualized, the ascending aorta was not well visualized and the aortic root was not well visualized.  Venous: The pulmonary veins were not well visualized. The inferior vena cava is dilated in size with less than 50% respiratory variability, suggesting right atrial pressure of 15 mmHg.  IAS/Shunts: No atrial level shunt detected by color flow Doppler.   LEFT VENTRICLE PLAX 2D LVIDd:         5.70 cm   Diastology LVIDs:         4.40 cm   LV e' medial:    5.11 cm/s LV PW:         1.30 cm   LV E/e' medial:  14.2 LV IVS:        1.40 cm   LV e' lateral:   8.38 cm/s LVOT diam:     2.00 cm   LV E/e' lateral: 8.6 LV SV:         67 LV SV Index:   31 LVOT Area:     3.14 cm   RIGHT VENTRICLE            IVC RV Basal diam:  3.10 cm  IVC diam: 2.80 cm RV S prime:     5.66 cm/s TAPSE (M-mode): 2.0 cm  LEFT ATRIUM             Index        RIGHT ATRIUM           Index LA diam:        4.70 cm 2.18 cm/m   RA Area:     14.30 cm LA Vol (A2C):   55.0 ml 25.55 ml/m  RA Volume:   31.50 ml  14.63 ml/m LA Vol (A4C):   53.0 ml 24.62 ml/m LA Biplane Vol: 57.5 ml 26.71 ml/m AORTIC VALVE LVOT Vmax:   77.00 cm/s LVOT Vmean:  58.300 cm/s LVOT VTI:    0.213 m  AORTA Ao Root diam: 3.30 cm Ao Asc diam:  3.60 cm Ao Desc diam: 1.70 cm  MITRAL VALVE               TRICUSPID VALVE MV Area (PHT): 3.27 cm    TR Peak grad:   23.4 mmHg MV Decel Time: 232 msec    TR Vmax:        242.00 cm/s MV E velocity: 72.40 cm/s MV A velocity: 74.60 cm/s  SHUNTS MV E/A ratio:  0.97        Systemic VTI:  0.21 m Systemic Diam: 2.00 cm  Redell Leiter MD Electronically signed by Redell Leiter MD Signature Date/Time: 02/15/2022/12:44:14  PM    Final          ______________________________________________________________________________________________          Recent Labs: No results found for requested labs within last 365 days.  Recent Lipid Panel    Component Value Date/Time   CHOL 184 04/17/2022 0835   TRIG 121 04/17/2022 0835   HDL 32 (L) 04/17/2022 0835   CHOLHDL 5.8 (H) 04/17/2022 0835   LDLCALC 130 (H) 04/17/2022 9164    Physical Exam:    VS:  There were no vitals taken for this visit.    Wt Readings from Last 3 Encounters:  06/14/24 186 lb (84.4 kg)  05/26/23 219 lb (99.3 kg)  03/03/23 205 lb (93 kg)     GEN: *** Well nourished, well developed in no acute distress HEENT: Normal NECK: No JVD; No carotid bruits LYMPHATICS: No lymphadenopathy CARDIAC: ***RRR, no murmurs, rubs, gallops RESPIRATORY:  Clear to auscultation without rales, wheezing or rhonchi  ABDOMEN: Soft, non-tender, non-distended MUSCULOSKELETAL:  No edema; No deformity  SKIN: Warm and dry NEUROLOGIC:  Alert and oriented x 3 PSYCHIATRIC:  Normal affect    Signed, Redell Leiter, MD  08/12/2024 7:49 PM    [facility]

## 2024-08-13 ENCOUNTER — Encounter: Payer: Self-pay | Admitting: Cardiology

## 2024-08-13 ENCOUNTER — Ambulatory Visit: Attending: Cardiology | Admitting: Cardiology

## 2024-08-13 VITALS — BP 160/68 | HR 74 | Ht 65.0 in | Wt 180.0 lb

## 2024-08-13 DIAGNOSIS — Z951 Presence of aortocoronary bypass graft: Secondary | ICD-10-CM | POA: Diagnosis not present

## 2024-08-13 DIAGNOSIS — I110 Hypertensive heart disease with heart failure: Secondary | ICD-10-CM

## 2024-08-13 DIAGNOSIS — I25119 Atherosclerotic heart disease of native coronary artery with unspecified angina pectoris: Secondary | ICD-10-CM | POA: Diagnosis not present

## 2024-08-13 DIAGNOSIS — N1832 Chronic kidney disease, stage 3b: Secondary | ICD-10-CM

## 2024-08-13 DIAGNOSIS — R001 Bradycardia, unspecified: Secondary | ICD-10-CM

## 2024-08-13 DIAGNOSIS — Z950 Presence of cardiac pacemaker: Secondary | ICD-10-CM

## 2024-08-13 DIAGNOSIS — I480 Paroxysmal atrial fibrillation: Secondary | ICD-10-CM

## 2024-08-13 DIAGNOSIS — Z7901 Long term (current) use of anticoagulants: Secondary | ICD-10-CM

## 2024-08-13 NOTE — Patient Instructions (Signed)

## 2024-08-25 ENCOUNTER — Ambulatory Visit: Payer: Medicare PPO

## 2024-08-25 DIAGNOSIS — I495 Sick sinus syndrome: Secondary | ICD-10-CM | POA: Diagnosis not present

## 2024-08-26 LAB — CUP PACEART REMOTE DEVICE CHECK
Battery Remaining Longevity: 56 mo
Battery Voltage: 2.96 V
Brady Statistic AP VP Percent: 99.17 %
Brady Statistic AP VS Percent: 0.01 %
Brady Statistic AS VP Percent: 0.79 %
Brady Statistic AS VS Percent: 0.02 %
Brady Statistic RA Percent Paced: 99.08 %
Brady Statistic RV Percent Paced: 99.97 %
Date Time Interrogation Session: 20251021211459
Implantable Lead Connection Status: 753985
Implantable Lead Connection Status: 753985
Implantable Lead Implant Date: 20201006
Implantable Lead Implant Date: 20201006
Implantable Lead Location: 753859
Implantable Lead Location: 753860
Implantable Lead Model: 5076
Implantable Lead Model: 5076
Implantable Pulse Generator Implant Date: 20201006
Lead Channel Impedance Value: 285 Ohm
Lead Channel Impedance Value: 361 Ohm
Lead Channel Impedance Value: 380 Ohm
Lead Channel Impedance Value: 418 Ohm
Lead Channel Pacing Threshold Amplitude: 0.625 V
Lead Channel Pacing Threshold Amplitude: 0.75 V
Lead Channel Pacing Threshold Pulse Width: 0.4 ms
Lead Channel Pacing Threshold Pulse Width: 0.4 ms
Lead Channel Sensing Intrinsic Amplitude: 2.125 mV
Lead Channel Sensing Intrinsic Amplitude: 2.125 mV
Lead Channel Sensing Intrinsic Amplitude: 7.5 mV
Lead Channel Sensing Intrinsic Amplitude: 7.5 mV
Lead Channel Setting Pacing Amplitude: 1.5 V
Lead Channel Setting Pacing Amplitude: 2.5 V
Lead Channel Setting Pacing Pulse Width: 0.4 ms
Lead Channel Setting Sensing Sensitivity: 1.2 mV
Zone Setting Status: 755011
Zone Setting Status: 755011

## 2024-08-27 NOTE — Progress Notes (Signed)
 Remote PPM Transmission

## 2024-08-31 ENCOUNTER — Ambulatory Visit: Payer: Self-pay | Admitting: Cardiology

## 2024-11-24 ENCOUNTER — Ambulatory Visit: Payer: Medicare PPO

## 2024-11-24 DIAGNOSIS — I495 Sick sinus syndrome: Secondary | ICD-10-CM | POA: Diagnosis not present

## 2024-11-25 ENCOUNTER — Ambulatory Visit: Payer: Self-pay | Admitting: Cardiology

## 2024-11-25 LAB — CUP PACEART REMOTE DEVICE CHECK
Battery Remaining Longevity: 51 mo
Battery Voltage: 2.95 V
Brady Statistic AP VP Percent: 99.35 %
Brady Statistic AP VS Percent: 0.03 %
Brady Statistic AS VP Percent: 0.6 %
Brady Statistic AS VS Percent: 0.02 %
Brady Statistic RA Percent Paced: 99.23 %
Brady Statistic RV Percent Paced: 99.95 %
Date Time Interrogation Session: 20260121002408
Implantable Lead Connection Status: 753985
Implantable Lead Connection Status: 753985
Implantable Lead Implant Date: 20201006
Implantable Lead Implant Date: 20201006
Implantable Lead Location: 753859
Implantable Lead Location: 753860
Implantable Lead Model: 5076
Implantable Lead Model: 5076
Implantable Pulse Generator Implant Date: 20201006
Lead Channel Impedance Value: 304 Ohm
Lead Channel Impedance Value: 361 Ohm
Lead Channel Impedance Value: 418 Ohm
Lead Channel Impedance Value: 418 Ohm
Lead Channel Pacing Threshold Amplitude: 0.625 V
Lead Channel Pacing Threshold Amplitude: 0.75 V
Lead Channel Pacing Threshold Pulse Width: 0.4 ms
Lead Channel Pacing Threshold Pulse Width: 0.4 ms
Lead Channel Sensing Intrinsic Amplitude: 2.375 mV
Lead Channel Sensing Intrinsic Amplitude: 2.375 mV
Lead Channel Sensing Intrinsic Amplitude: 7.5 mV
Lead Channel Sensing Intrinsic Amplitude: 7.5 mV
Lead Channel Setting Pacing Amplitude: 1.5 V
Lead Channel Setting Pacing Amplitude: 2.5 V
Lead Channel Setting Pacing Pulse Width: 0.4 ms
Lead Channel Setting Sensing Sensitivity: 1.2 mV
Zone Setting Status: 755011
Zone Setting Status: 755011

## 2024-11-27 NOTE — Progress Notes (Signed)
 Remote PPM Transmission

## 2025-02-23 ENCOUNTER — Ambulatory Visit: Payer: Medicare PPO

## 2025-05-25 ENCOUNTER — Ambulatory Visit: Payer: Medicare PPO

## 2025-08-24 ENCOUNTER — Ambulatory Visit: Payer: Medicare PPO

## 2025-11-23 ENCOUNTER — Ambulatory Visit: Payer: Medicare PPO
